# Patient Record
Sex: Male | Born: 1940
Health system: Southern US, Community
[De-identification: ages and names within clinical notes are randomized; demographics above are authoritative.]

## PROBLEM LIST (undated history)

## (undated) DIAGNOSIS — F0390 Unspecified dementia without behavioral disturbance: Secondary | ICD-10-CM

## (undated) DIAGNOSIS — M1990 Unspecified osteoarthritis, unspecified site: Secondary | ICD-10-CM

## (undated) DIAGNOSIS — D074 Carcinoma in situ of penis: Secondary | ICD-10-CM

## (undated) DIAGNOSIS — E785 Hyperlipidemia, unspecified: Secondary | ICD-10-CM

## (undated) DIAGNOSIS — N189 Chronic kidney disease, unspecified: Secondary | ICD-10-CM

## (undated) DIAGNOSIS — I10 Essential (primary) hypertension: Secondary | ICD-10-CM

## (undated) DIAGNOSIS — I6529 Occlusion and stenosis of unspecified carotid artery: Secondary | ICD-10-CM

## (undated) HISTORY — PX: KNEE SURGERY: SHX244

## (undated) HISTORY — PX: APPENDECTOMY: SHX54

## (undated) HISTORY — DX: Unspecified dementia, unspecified severity, without behavioral disturbance, psychotic disturbance, mood disturbance, and anxiety: F03.90

## (undated) HISTORY — DX: Hyperlipidemia, unspecified: E78.5

## (undated) HISTORY — PX: COLONOSCOPY: SHX174

## (undated) HISTORY — DX: Occlusion and stenosis of unspecified carotid artery: I65.29

---

## 2007-12-28 HISTORY — PX: CAROTID ENDARTERECTOMY: SUR193

## 2008-10-23 ENCOUNTER — Ambulatory Visit: Payer: Self-pay | Admitting: Vascular Surgery

## 2008-10-28 ENCOUNTER — Ambulatory Visit: Payer: Self-pay | Admitting: Vascular Surgery

## 2008-10-28 ENCOUNTER — Inpatient Hospital Stay (HOSPITAL_COMMUNITY): Admission: RE | Admit: 2008-10-28 | Discharge: 2008-10-30 | Payer: Self-pay | Admitting: Vascular Surgery

## 2008-10-28 ENCOUNTER — Encounter: Payer: Self-pay | Admitting: Vascular Surgery

## 2008-11-13 ENCOUNTER — Ambulatory Visit: Payer: Self-pay | Admitting: Vascular Surgery

## 2009-03-19 ENCOUNTER — Encounter: Admission: RE | Admit: 2009-03-19 | Discharge: 2009-03-19 | Payer: Self-pay | Admitting: Podiatry

## 2009-05-14 ENCOUNTER — Ambulatory Visit: Payer: Self-pay | Admitting: Vascular Surgery

## 2009-11-12 ENCOUNTER — Ambulatory Visit: Payer: Self-pay | Admitting: Vascular Surgery

## 2010-11-13 ENCOUNTER — Ambulatory Visit: Payer: Self-pay | Admitting: Vascular Surgery

## 2011-05-11 NOTE — Procedures (Signed)
CAROTID DUPLEX EXAM   INDICATION:  Followup evaluation of known carotid artery disease.   HISTORY:  Diabetes:  No.  Cardiac:  No.  Hypertension:  No.  Smoking:  No.  Previous Surgery:  Right carotid endarterectomy with Dacron patch  angioplasty on 10/28/2008.  CV History:  The patient reports no cerebrovascular symptoms at this  time.  Amaurosis Fugax No, Paresthesias No, Hemiparesis No                                       RIGHT             LEFT  Brachial systolic pressure:         142               142  Brachial Doppler waveforms:         Triphasic         Triphasic  Vertebral direction of flow:        Antegrade         Antegrade  DUPLEX VELOCITIES (cm/sec)  CCA peak systolic                   82                90  ECA peak systolic                   110               84  ICA peak systolic                   77                55  ICA end diastolic                   21                18  PLAQUE MORPHOLOGY:                  Soft              Mixed  PLAQUE AMOUNT:                      Mild              Mild  PLAQUE LOCATION:                    Proximal ICA      Proximal ICA   IMPRESSION:  1. No right ICA stenosis status post endarterectomy.  2. 20-39% left ICA stenosis.     ___________________________________________  Janetta Hora Fields, MD   MC/MEDQ  D:  05/14/2009  T:  05/14/2009  Job:  [id number]

## 2011-05-11 NOTE — Assessment & Plan Note (Signed)
OFFICE VISIT   Blake Atkins, Blake Atkins  DOB:  Mar 15, 1941                                       11/13/2008  CHART#:[id number]   The patient returns for followup today.  He underwent right carotid  endarterectomy on November 2.  He had a fairly high bifurcation and  still has some mild hypoglossal cranial nerve palsy.  This is improving  however.  He has had no other neurologic symptoms otherwise.  His  incision is healing well.  He has no carotid bruit.  He will return to  his normal activities as preoperatively.  He will follow up with me in  six months' time for repeat carotid duplex exam.  He will continue to  take his aspirin daily.  Hopefully within the next few weeks most of his  tongue deviation will improve and his swallowing will improve as well.   Janetta Hora. Fields, MD  Electronically Signed   CEF/MEDQ  D:  11/13/2008  T:  11/14/2008  Job:  [id number]   cc:   Kari Baars, M.D.

## 2011-05-11 NOTE — Op Note (Signed)
Blake Atkins, Blake Atkins NO.:  [id number]   MEDICAL RECORD NO.:  [id number]          PATIENT TYPE:  INP   LOCATION:  2306                         FACILITY:  [facility]   PHYSICIAN:  Janetta Hora. Fields, MD  DATE OF BIRTH:  Jul 26, 1941   DATE OF PROCEDURE:  10/28/2008  DATE OF DISCHARGE:                               OPERATIVE REPORT   PROCEDURE:  Right carotid endarterectomy.   PREOPERATIVE DIAGNOSIS:  High-grade right internal carotid artery  stenosis.   POSTOPERATIVE DIAGNOSIS:  High-grade right internal carotid artery  stenosis.   ANESTHESIA:  General.   ASSISTANT:  Wilmon Arms, PA-C   OPERATIVE FINDINGS:  1. Greater than 80% right internal carotid artery stenosis.  2. A 10-French shunt.  3. Dacron patch.  4. High carotid bifurcation.   OPERATIVE DETAILS:  After obtaining informed consent, the patient was  taken to the operating.  The patient was placed supine position on the  operating table.  After induction of general anesthesia endotracheal  intubation, the patient's entire right neck and chest were prepped and  draped usual sterile fashion.  Next, an oblique incision was made on the  right side of the neck just anterior to border of the right  sternocleidomastoid muscle.  Incision was carried down through  subcutaneous tissues down to the level of platysma.  Platysma was  incised for its full length of the incision.  Dissection was carried  down to the level of the jugular vein.  This was reflected laterally.  Common facial vein was identified and dissected free circumferentially.  This was ligated and divided between silk ties.  Next, a common carotid  artery was dissected free at the base of the incision.  Vagus nerve was  identified and protected.  Umbilical tape was placed around the common  carotid artery.  The patients carotid bifurcation was rotated over  fairly severely.  This required division of the superior thyroid artery  to allow  further rotation of the artery over to expose the internal  carotid artery, which was fairly deep and posterior.  Next, the external  carotid artery was dissected free circumferentially and a vessel loop  placed around this.  The carotid bifurcation was fairly high.  The area  of disease in the distal internal carotid artery was well above the  bifurcation and this required fairly extensive mobilization of the  distal internal carotid artery to reach the level of disease.  The  hypoglossal nerve was fully mobilized circumferentially.  A vessel loop  was placed around this.  The ansa cervicalis was divided.  The  sternocleidomastoid branches of the occipital artery were dissected free  circumferentially and ligated and divided between silk ties.  Posterior  belly of digastric muscle was divided through its tendon.  After all of  these maneuvers, I was able to expose the more distal internal carotid  artery and place a vessel loop around this.  Next, the patient was given  5000 units of intravenous heparin.  The distal internal carotid artery  was controlled with a vessel loop.  The external carotid artery was also  controlled with vessel loop.  The common carotid artery was controlled  with peripheral DeBakey clamp.  Longitudinal opening was made in the  common carotid artery and this was extended up through the level of  disease.  There was a high-grade stenosis with fairly soft and friable  plaque.  Stenosis approximately 80%.  A 10-French shunt was brought up  in the operative field and with some manipulation I was able to thread  this into the distal internal carotid artery.  Due to the very high  extent of the plaque and the dissection, it has took approximately 10  minutes to thread the shunt into the distal internal carotid artery.  This was then allowed to thoroughly back bleed.  Proximal portion of the  shunt was then threaded down into the common carotid artery and this was   secured with a Rumel tourniquet.  Next, the shunt was reopened with  restoration of flow to the brain.  An endarterectomy was then begun in a  suitable plane.  A fairly reasonable distal endpoint was obtained.  However, there was a area of intima that was fairly loose at appearance,  so this was tacked with several 7-0 Prolene sutures along with posterior  wall.  The endpoint was then secured at this point.  The remainder of  the plaque was then removed from the common carotid artery from the  external carotid artery using eversion technique.  All loose debris was  then removed from the carotid artery.  A Dacron patch was then brought  up into the operative field and sewn on his patch angioplasty using a  running 6-0 Prolene suture.  Just prior to completion of anastomosis,  the shunt was a reoccluded.  This was then first removed from the distal  internal carotid artery and allowed to back bleed thoroughly.  This was  then secured with a fine bulldog clamp.  The distal external carotid  artery was thoroughly back bled.  This was then resecured with a vessel  loop.  Common carotid artery was opened and the shunt removed from the  proximal common carotid artery and this secured with a peripheral  DeBakey clamp.  This was thoroughly flushed forward.  Everything was  then thoroughly irrigated with heparinized saline.  The remainder of the  patch was completed.  Flow was then first restored up into the external  carotid artery after approximately 5 cardiac cycles to the internal  carotid artery.  There was one area in the distal patch and 2 areas  along with side of patch that required suture repair.  After this,  everything was hemostatic.  The patient had been given an additional  5000 units of heparin during the case.  Heparin was partially reversed  at the end by administering 50 mg of protamine.  After hemostasis had  been obtained.  Doppler was used to evaluate the internal, external,  and  common carotid arteries.  These all had good Doppler flow.  There was  one additional nerve that was fairly posterior to the artery that had  also been dissected free.  This was thought to be the superior laryngeal  nerve.  Care was taken not to put too much traction on this nerve and  this was left intact.  After hemostasis had been obtained, the posterior  and anterior belly of the digastric muscle was reapproximated using a  interrupted 3-0 Prolene U stitch.  Wound was then thoroughly inspected  and found be hemostatic.  A 10-flat Jackson-Pratt drain was then placed  in the bed of carotid, brought out through separate stab incision  laterally in the neck.  This was secured in place with a 3-0 nylon  suture.  Platysma was reapproximated using a running 3-0 Vicryl suture.  Skin was closed with 4-0 Vicryl subcuticular stitch.  The patient  tolerated the procedure well and there were no complications.  Sponge  and needle counts were correct at the end of the case.  The patient was  taken to recovery room in stable condition.  He was moving upper  extremities and lower extremities symmetrically with 5/5 motor strength  at time of transfer to recovery room.      Janetta Hora. Fields, MD  Electronically Signed     CEF/MEDQ  D:  10/28/2008  T:  10/29/2008  Job:  [id number]

## 2011-05-11 NOTE — Discharge Summary (Signed)
NAMEKANIN, LIA NO.:  [id number]   MEDICAL RECORD NO.:  [id number]          PATIENT TYPE:  INP   LOCATION:  2306                         FACILITY:  [facility]   PHYSICIAN:  Janetta Hora. Fields, MD  DATE OF BIRTH:  24-Dec-1941   DATE OF ADMISSION:  10/28/2008  DATE OF DISCHARGE:  10/29/2008                               DISCHARGE SUMMARY   DISCHARGE DIAGNOSIS:  1. Right carotid occlusive disease.  2. Dyslipidemia.   PROCEDURE PERFORMED:  Right carotid endarterectomy with Dacron patch  angioplasty closure by Dr. Darrick Penna on October 28, 2008.   COMPLICATIONS:  None.   DISCHARGE MEDICATIONS:  1. Simvastatin 80 mg p.o. daily.  2. Aspirin 81 mg p.o. daily.  3. Advil 200 mg p.o. daily.  4. Restasis p.r.n.  5. Prednisolone acetate 1 drop in each eye p.r.n.  6. He is given a prescription for Percocet 5/325 one p.o. q.4 h.      p.r.n. pain total #30 were given.   CONDITION ON DISCHARGE:  Stable and improving.   DISPOSITION:  He has been discharged home in stable condition with his  wound healing well.  He is given careful instructions regarding his  activity levels and care of his wounds.  He is to return to see Dr.  Darrick Penna in 2 weeks for followup.  Brief identifying statement with  complete details, please refer the typed history and physical.  Briefly,  this very pleasant 70 year old gentleman was referred to Dr. Darrick Penna with  carotid narrowing.  Dr. Darrick Penna evaluated and found him to be a suitable  candidate for carotid endarterectomy.  He was informed of the risks and  benefits of the procedure and after careful consideration elected to  proceed with surgery.   HOSPITAL COURSE:  Preoperative workup was completed as an outpatient.  He was brought in through same-day surgery and underwent the  aforementioned right carotid endarterectomy.  For complete details,  please refer the typed operative report.  The procedure was without  complications.  He was returned to  the post anesthesia care unit and  extubated.  Following stabilization, he was admitted to a bed in a  surgical step-down unit.  The following morning, he had a residual right  tongue deviation, which was mild.  This should resolve over the next  several weeks.  He was otherwise neurologically intact.  He was desirous  of discharge and was discharged home in stable condition.      Wilmon Arms, PA      Janetta Hora. Fields, MD  Electronically Signed    KEL/MEDQ  D:  10/29/2008  T:  10/29/2008  Job:  [id number]

## 2011-05-11 NOTE — Procedures (Signed)
CAROTID DUPLEX EXAM   INDICATION:  Followup known carotid artery disease.   HISTORY:  Diabetes:  No.  Cardiac:  No.  Hypertension:  No.  Smoking:  No.  Previous Surgery:  Right carotid endarterectomy 10/28/2008.  CV History:  No.  Amaurosis Fugax No, Paresthesias No, Hemiparesis No                                       RIGHT             LEFT  Brachial systolic pressure:         122               125  Brachial Doppler waveforms:         Normal            Normal  Vertebral direction of flow:        Antegrade         Antegrade  DUPLEX VELOCITIES (cm/sec)  CCA peak systolic                   93                96  ECA peak systolic                   89                94  ICA peak systolic                   71                85  ICA end diastolic                   29                39  PLAQUE MORPHOLOGY:                                    Heterogeneous  PLAQUE AMOUNT:                      None              Mild  PLAQUE LOCATION:                                      ICA   IMPRESSION:  1. No right internal carotid artery stenosis, status post carotid      endarterectomy.  2. Left internal carotid artery velocities suggest a 1%-39% stenosis.  3. No significant change from previous study.   ___________________________________________  Blake Hora Fields, MD   EM/MEDQ  D:  11/13/2010  T:  11/13/2010  Job:  [id number]

## 2011-05-11 NOTE — Procedures (Signed)
CAROTID DUPLEX EXAM   INDICATION:  Followup known carotid artery disease.   HISTORY:  Diabetes:  No.  Cardiac:  No.  Hypertension:  No.  Smoking:  No.  Previous Surgery:  No.  CV History:  No.  Amaurosis Fugax No, Paresthesias No, Hemiparesis No                                       RIGHT             LEFT  Brachial systolic pressure:         160               140  Brachial Doppler waveforms:         Biphasic          Biphasic  Vertebral direction of flow:        Antegrade         Antegrade  DUPLEX VELOCITIES (cm/sec)  CCA peak systolic                   96                77  ECA peak systolic                   94                72  ICA peak systolic                   422               89  ICA end diastolic                   202               40  PLAQUE MORPHOLOGY:                  Heterogenous      Heterogenous  PLAQUE AMOUNT:                      Severe            Mild  PLAQUE LOCATION:                    ICA               ICA   IMPRESSION:  1. 80-99% stenosis noted in the right ICA.  2. 1-39% stenosis noted in the left ICA.  3. Antegrade bilateral vertebral arteries.   ___________________________________________  Janetta Hora Fields, MD   MG/MEDQ  D:  10/23/2008  T:  10/23/2008  Job:  [id number]

## 2011-05-11 NOTE — Assessment & Plan Note (Signed)
OFFICE VISIT   HODGES, TREIBER  DOB:  May 12, 1941                                       10/23/2008  CHART#:[id number]   The patient is a 70 year old male referred by Dr. Clelia Croft for evaluation of  asymptomatic carotid bruit.  He apparently had a duplex exam which  suggested high-grade stenosis.  The patient's atherosclerotic risk  factors include elevated cholesterol.  He denies history of diabetes,  hypertension or coronary artery disease.  He has never been a smoker.  He denies any symptoms of TIA, amaurosis or stroke.  He walks  approximately 10 miles a day.  He frequently plays golf and walks.  He  exercises on a daily basis.  He has no history of shortness of breath,  weakness or chest pain.   PAST MEDICAL HISTORY:  Otherwise unremarkable.   PAST SURGICAL HISTORY:  He had an appendectomy and a right knee  operation.   MEDICATIONS:  Include simvastatin 80 mg once a day, aspirin 81 mg once a  day, Advil 200 mg once a day, prednisolone acetate ophthalmic suspension  1% p.r.n., Restasis eye drops p.r.n.   He has no known drug allergies.   FAMILY HISTORY:  Unremarkable.   SOCIAL HISTORY:  He is married and has 2 children, is a retired of  [employer].  He has never smoked.  He drinks 1 glass of red wine  daily.   REVIEW OF SYSTEMS:  He is 5 feet 9 inches, 184 pounds.  Cardiac,  pulmonary, GI, renal, vascular, neurologic, orthopedic, psychiatric, ENT  and hematologic review of systems are all negative.   PHYSICAL EXAM:  Blood pressure is 131/85 in the left arm, 152/93 in the  right arm, pulse is 50 and regular.  HEENT is unremarkable.  He has 2+  carotid pulses with a right carotid bruit.  Chest:  Clear to  auscultation.  Cardiac exam is regular rate rhythm without murmur.  Abdomen is soft, nontender, nondistended with no masses.  Extremities:  He has 2+ radial pulses bilaterally.  He has 2+ femoral, popliteal,  dorsalis pedis and posterior tibial  pulses bilaterally.  He has no edema  in the lower extremities.  Neurologic:  Exam shows symmetric upper  extremity and lower extremity motor strength which is 5/5.  He has no  asymmetry of his face.  Cranial nerves II-XII are intact.   We repeated his carotid duplex exam today which shows a high-grade  stenosis of his right internal carotid artery.  He also has a high  bifurcation on the right side.  The diseased segment is approximately  1.5 cm above the carotid bifurcation.  Peak systolic velocity was 422  cm/sec with an end diastolic velocity of 202 cm/sec.  Of note, he also  had a 20 mm discrepancy in his blood pressure today with the right blood  pressure being 20 mm higher than the left.  He had antegrade flow in  both vertebral arteries.   In summary, the patient has a high-grade right internal carotid artery  stenosis which is currently asymptomatic.  I believe he would benefit  from right carotid endarterectomy for stroke prophylaxis.  I have told  him to continue his aspirin.  Procedure details, risks, benefits, and  possible complications of carotid endarterectomy were explained to the  patient today.  Risk of stroke 1-2%.  Risk of cranial nerve injury 5-  10%, especially in light of the high bifurcation, small risk of  bleeding, small risk of infection, small risk of myocardial infarction.  He should also have his arterial line placed on the right side since he  does have a blood pressure discrepancy and probably has some mild left  subclavian artery stenosis.  His carotid endarterectomy is scheduled for  Monday October 28, 2008.   Janetta Hora. Fields, MD  Electronically Signed   CEF/MEDQ  D:  10/24/2008  T:  10/24/2008  Job:  [id number]   cc:   Kari Baars, M.D.

## 2011-05-11 NOTE — Procedures (Signed)
CAROTID DUPLEX EXAM   INDICATION:  Follow up known carotid artery disease.   HISTORY:  Diabetes:  No.  Cardiac:  No.  Hypertension:  No.  Smoking:  No.  Previous Surgery:  Right carotid endarterectomy on 10/28/08.  CV History:  No.  Amaurosis Fugax No, Paresthesias No, Hemiparesis No.                                       RIGHT             LEFT  Brachial systolic pressure:         152               176  Brachial Doppler waveforms:         Normal            Normal  Vertebral direction of flow:        Antegrade         Antegrade  DUPLEX VELOCITIES (cm/sec)  CCA peak systolic                   95                129  ECA peak systolic                   114               101  ICA peak systolic                   68                76  ICA end diastolic                   21                23  PLAQUE MORPHOLOGY:                                    Heterogenous  PLAQUE AMOUNT:                                        Mild  PLAQUE LOCATION:                                      ICA   IMPRESSION:  1. No right internal carotid artery stenosis, status post      endarterectomy.  2. 0-39% stenosis of the left internal carotid artery.  3. Antegrade flow in bilateral vertebrals.   ___________________________________________  Janetta Hora Fields, MD   CB/MEDQ  D:  11/12/2009  T:  11/12/2009  Job:  [id number]

## 2011-09-28 LAB — COMPREHENSIVE METABOLIC PANEL WITH GFR
ALT: 25
AST: 27
Albumin: 3.9
Alkaline Phosphatase: 53
BUN: 12
CO2: 28
Calcium: 9.6
Chloride: 106
Creatinine, Ser: 1.19
GFR calc non Af Amer: >60
Glucose, Bld: 91
Potassium: 5
Sodium: 139
Total Bilirubin: 1.2
Total Protein: 6.5

## 2011-09-28 LAB — BASIC METABOLIC PANEL WITH GFR
BUN: 12
CO2: 25
Calcium: 8.4
Chloride: 106
Creatinine, Ser: 1.24
GFR calc non Af Amer: 58 — ABNORMAL LOW
Glucose, Bld: 142 — ABNORMAL HIGH
Potassium: 3.8
Sodium: 136

## 2011-09-28 LAB — CBC
Hemoglobin: 12.7 — ABNORMAL LOW
MCV: 100.5 — ABNORMAL HIGH
Platelets: 226
RDW: 12.5
RDW: 12.3
WBC: 5.6

## 2011-09-28 LAB — TYPE AND SCREEN

## 2011-09-28 LAB — APTT: aPTT: 27

## 2011-09-28 LAB — URINALYSIS, ROUTINE W REFLEX MICROSCOPIC
Bilirubin Urine: NEGATIVE
Glucose, UA: NEGATIVE

## 2011-09-28 LAB — ABO/RH: ABO/RH(D): A POS

## 2011-09-28 LAB — PROTIME-INR: Prothrombin Time: 14

## 2011-10-08 ENCOUNTER — Encounter: Payer: Self-pay | Admitting: Vascular Surgery

## 2011-10-26 ENCOUNTER — Encounter: Payer: Self-pay | Admitting: Internal Medicine

## 2011-11-19 ENCOUNTER — Ambulatory Visit: Payer: Self-pay

## 2011-11-19 ENCOUNTER — Other Ambulatory Visit: Payer: Self-pay

## 2011-11-23 ENCOUNTER — Ambulatory Visit (AMBULATORY_SURGERY_CENTER): Payer: Medicare Other

## 2011-11-23 VITALS — Ht 70.0 in | Wt 190.4 lb

## 2011-11-23 DIAGNOSIS — Z1211 Encounter for screening for malignant neoplasm of colon: Secondary | ICD-10-CM

## 2011-11-23 MED ORDER — PEG-KCL-NACL-NASULF-NA ASC-C 100 G PO SOLR: 1.0000 | Freq: Once | ORAL | Status: AC

## 2011-11-23 NOTE — Progress Notes (Signed)
Patient and wife Blake Atkins) came into office today for the pre-visit prior to the colonoscopy with Dr Marina Goodell on 12/07/11. Pt had a colonoscopy done in [place], [place] in 2001 (doesn't know doctors name), but the wife will bring a copy of the colonoscopy report to his appt on 12/07/11. Ulis Rias RN

## 2011-11-25 ENCOUNTER — Ambulatory Visit: Payer: Self-pay

## 2011-11-25 ENCOUNTER — Other Ambulatory Visit: Payer: Self-pay

## 2011-12-07 ENCOUNTER — Ambulatory Visit (AMBULATORY_SURGERY_CENTER): Payer: Medicare Other | Admitting: Internal Medicine

## 2011-12-07 ENCOUNTER — Encounter: Payer: Self-pay | Admitting: Internal Medicine

## 2011-12-07 VITALS — BP 128/90 | HR 66 | Temp 97.7°F | Resp 22 | Ht 70.0 in | Wt 190.0 lb

## 2011-12-07 DIAGNOSIS — D126 Benign neoplasm of colon, unspecified: Secondary | ICD-10-CM

## 2011-12-07 DIAGNOSIS — Z1211 Encounter for screening for malignant neoplasm of colon: Secondary | ICD-10-CM

## 2011-12-07 MED ORDER — SODIUM CHLORIDE 0.9 % IV SOLN: 500.0000 mL | INTRAVENOUS | Status: DC

## 2011-12-07 NOTE — Op Note (Signed)
[facility] [address] [place], [place]  [address]  COLONOSCOPY PROCEDURE REPORT  PATIENT:  Blake Atkins, Blake Atkins  MR#:  [id number] BIRTHDATE:  [birth year], 70 yrs. old  GENDER:  male ENDOSCOPIST:  Wilhemina Bonito. Eda Keys, MD REF. BY:  Kari Baars, M.D. PROCEDURE DATE:  12/07/2011 PROCEDURE:  Colonoscopy with snare polypectomy x 1 ASA CLASS:  Class II INDICATIONS:  colorectal cancer screening, average risk ; negative index exam in [place], [place] 12-23-00 (Dr Lucretia Roers) MEDICATIONS:   Fentanyl 75 mcg IV, Versed 7 mg IV, These medications were titrated to patient response per physician's verbal order  DESCRIPTION OF PROCEDURE:   After the risks benefits and alternatives of the procedure were thoroughly explained, informed consent was obtained.  Digital rectal exam was performed and revealed no abnormalities.   The LB CF-[id number] endoscope was introduced through the anus and advanced to the cecum, which was identified by both the appendix and ileocecal valve, without limitations.  The quality of the prep was excellent, using MoviPrep.  The instrument was then slowly withdrawn as the colon was fully examined. <<PROCEDUREIMAGES>>  FINDINGS:  Two polyps were found ascending colon (6mm) and the sigmoid colon (3mm). Polyps were snared without cautery. Retrieval was successful. Moderate diverticulosis was found in the sigmoid colon.  Otherwise normal colonoscopy without other polyps, masses, vascular ectasias, or inflammatory changes.   Retroflexed views in the rectum revealed internal hemorrhoids.    The time to cecum = 4:07  minutes. The scope was then withdrawn in  11:22  minutes from the cecum and the procedure completed.  COMPLICATIONS:  None  ENDOSCOPIC IMPRESSION: 1) Two polyps in the ascending colon and sigmoid colon - removed  2) Moderate diverticulosis in the sigmoid colon 3) Otherwise normal colonoscopy 4) Internal hemorrhoids  RECOMMENDATIONS: 1) Repeat  colonoscopy in 5 years if polyp adenomatous; otherwise 10 years  ______________________________ Wilhemina Bonito. Eda Keys, MD  CC:  Kari Baars, MD;  The Patient  n. eSIGNED:   Wilhemina Bonito. Eda Keys at 12/07/2011 01:31 PM  Barton Dubois, [phone]

## 2011-12-07 NOTE — Progress Notes (Signed)
Patient did not experience any of the following events: a burn prior to discharge; a fall within the facility; wrong site/side/patient/procedure/implant event; or a hospital transfer or hospital admission upon discharge from the facility. (G8907) Patient did not have preoperative order for IV antibiotic SSI prophylaxis. (G8918)  

## 2011-12-08 ENCOUNTER — Telehealth: Payer: Self-pay | Admitting: *Deleted

## 2011-12-08 NOTE — Telephone Encounter (Signed)

## 2011-12-10 ENCOUNTER — Other Ambulatory Visit: Payer: Self-pay | Admitting: Dermatology

## 2011-12-23 ENCOUNTER — Ambulatory Visit (INDEPENDENT_AMBULATORY_CARE_PROVIDER_SITE_OTHER): Payer: Medicare Other | Admitting: *Deleted

## 2011-12-23 ENCOUNTER — Encounter: Payer: Self-pay | Admitting: Physician Assistant

## 2011-12-23 ENCOUNTER — Ambulatory Visit (INDEPENDENT_AMBULATORY_CARE_PROVIDER_SITE_OTHER): Payer: Medicare Other | Admitting: Physician Assistant

## 2011-12-23 VITALS — BP 145/80 | HR 95 | Resp 20 | Ht 70.0 in | Wt 185.0 lb

## 2011-12-23 DIAGNOSIS — Z48812 Encounter for surgical aftercare following surgery on the circulatory system: Secondary | ICD-10-CM

## 2011-12-23 DIAGNOSIS — I6529 Occlusion and stenosis of unspecified carotid artery: Secondary | ICD-10-CM

## 2011-12-23 NOTE — Progress Notes (Signed)
[facility] [place] HISTORY AND PHYSICAL   CC:  Follow up carotid duplex scan  Referring Provider:  Kari Baars, MD  HPI: This is a 70 y.o. male here for f/u carotid duplex scan. He denies amaurosis fugax, paresthesias, or hemiparesis.  He plays golf daily and continues to exercise and work out daily.  Past Medical History  Diagnosis Date  . Hyperlipidemia   . Carotid artery occlusion    Past Surgical History  Procedure Date  . Knee surgery     right  . Carotid endarterectomy   . Appendectomy   . Colonoscopy     No Known Allergies  Current Outpatient Prescriptions  Medication Sig Dispense Refill  . aspirin EC 81 MG tablet Take 81 mg by mouth daily.        . clotrimazole-betamethasone (LOTRISONE) cream       . CRESTOR 40 MG tablet       . cycloSPORINE (RESTASIS) 0.05 % ophthalmic emulsion 1 drop as needed.        Marland Kitchen ibuprofen (ADVIL,MOTRIN) 200 MG tablet Take 200 mg by mouth every 6 (six) hours as needed.        . prednisoLONE acetate (PRED FORTE) 1 % ophthalmic suspension 1 drop as needed.        . simvastatin (ZOCOR) 40 MG tablet Take 40 mg by mouth at bedtime. Take 1/2 at bedtime        Family History  Problem Relation Age of Onset  . Diabetes Maternal Grandmother     History   Social History  . Marital Status: Married    Spouse Name: N/A    Number of Children: N/A  . Years of Education: N/A   Occupational History  . Not on file.   Social History Main Topics  . Smoking status: Never Smoker   . Smokeless tobacco: Never Used  . Alcohol Use: 4.2 oz/week    7 Glasses of wine per week  . Drug Use: No  . Sexually Active: Not on file   Other Topics Concern  . Not on file   Social History Narrative  . No narrative on file     ROS: [x]  Positive   [ ]  Negative   [ ]  All sytems reviewed and are negative  Cardiovascular: [] chest pain; [] chest pressure; [] palpitations; [] SOB lying flat; [] DOE; [] pain in legs with walking; [] pain in  legs when lying flat; [] Hx of DVT; [] Hx phlebitis; [] swelling in legs; [] varicose veins Pulmonary: [] productive cough; [] asthma; [] wheezing Neurologic: [] Hx CVA; [] weakness in arms or legs; [] numbness in arms or legs; [] difficulty in speaking or slurred speech; [] temporary loss of vision in one eye; [] dizziness Hematologic:  [] bleeding problems; [] clots easily GI:  [] vomiting blood; []  blood in stool; [] PUD GU: []  Dysuria; [] hematuria Psychiatric:  [] Hx major depression Integumentary:  [] rashes; [] ulcers Constitutional:  [] fever; [] chills   PHYSICAL EXAMINATION:  Filed Vitals:   12/23/11 1516  BP: 145/80  Pulse: 95  Resp: 20   Body mass index is 26.54 kg/(m^2).  General:  WDWN in NAD Gait: Normal HENT: WNL Eyes: PERRL Pulmonary: normal non-labored breathing , without Rales, rhonchi,  wheezing Cardiac: RRR, without  Murmurs, rubs or gallops; No carotid bruits Abdomen: soft, NT, no masses Skin: no rashes, ulcers noted Vascular Exam/Pulses: BLE warm and well perfused.  No edema.  2+ bilateral radial pulses. Extremities: without ischemic changes, no Gangrene , no cellulitis; no open wounds;  Musculoskeletal: no muscle wasting or atrophy  Neurologic: A&O X 3;  Appropriate Affect ; SENSATION: normal; MOTOR FUNCTION:  moving all extremities equally. Speech is fluent/normal  Non-Invasive Vascular Imaging: Carotid Duplex Scan: 12/23/11    Right  left  Brachial sys pressure iso 148  Brachial doppler waveforms WNL WNL  Vertebral direction of flow ante ante  DUPLEX VELOCITITES    CCA peak systolic 88 82  ECA peak systolic 134 81  ICA peak systolic 63 84  ICA end diastolic 13 24  Plaque morph    Plaque amt NA minimal      1. Widely patent R CIA without evidence of restenosis 2.  1-39% L ICA plaquing 3.  Bilateral VA WNL ASSESSMENT: 70 y.o. male here for f/u carotid duplex scan. Continues to remain asymptomatic.  PLAN: f/u carotid duplex in 1 year. He knows to contact us  sooner if he has any problems.  Newton Pigg, PA-C [facility] [phone]  Clinic MD:   Darrick Penna

## 2012-01-07 NOTE — Procedures (Unsigned)
CAROTID DUPLEX EXAM  INDICATION:  Follow up right CEA.  HISTORY: Diabetes:  No. Cardiac:  No. Hypertension:  No. Smoking:  No. Previous Surgery:  Right CEA, 10/28/08. CV History: Amaurosis Fugax No, Paresthesias No, Hemiparesis No.                                      RIGHT             LEFT Brachial systolic pressure:         150               148 Brachial Doppler waveforms:         WNL               WNL Vertebral direction of flow:        Antegrade         Antegrade DUPLEX VELOCITIES (cm/sec) CCA peak systolic                   88                82 ECA peak systolic                   134               81 ICA peak systolic                   63                87 ICA end diastolic                   13                24 PLAQUE MORPHOLOGY: PLAQUE AMOUNT:                      N/A               Minimal PLAQUE LOCATION:  IMPRESSION: 1. Widely patent right carotid endarterectomy without evidence of     restenosis or hyperplasia. 2. Minimal plaquing of the left internal carotid artery, resulting in     1% to 39% stenosis. 3. Bilateral vertebral arteries within normal limits.  ___________________________________________ Janetta Hora Fields, MD  LT/MEDQ  D:  12/23/2011  T:  12/23/2011  Job:  [id number]

## 2012-12-19 ENCOUNTER — Other Ambulatory Visit: Payer: Self-pay | Admitting: *Deleted

## 2012-12-19 DIAGNOSIS — I6529 Occlusion and stenosis of unspecified carotid artery: Secondary | ICD-10-CM

## 2012-12-19 DIAGNOSIS — Z48812 Encounter for surgical aftercare following surgery on the circulatory system: Secondary | ICD-10-CM

## 2012-12-26 ENCOUNTER — Encounter: Payer: Self-pay | Admitting: Neurosurgery

## 2012-12-27 HISTORY — PX: EYE SURGERY: SHX253

## 2012-12-28 ENCOUNTER — Encounter: Payer: Self-pay | Admitting: Neurosurgery

## 2012-12-28 ENCOUNTER — Ambulatory Visit (INDEPENDENT_AMBULATORY_CARE_PROVIDER_SITE_OTHER): Payer: Medicare Other | Admitting: Neurosurgery

## 2012-12-28 ENCOUNTER — Other Ambulatory Visit (INDEPENDENT_AMBULATORY_CARE_PROVIDER_SITE_OTHER): Payer: Medicare Other | Admitting: *Deleted

## 2012-12-28 VITALS — BP 136/86 | HR 53 | Resp 20 | Ht 70.0 in | Wt 171.0 lb

## 2012-12-28 DIAGNOSIS — I6529 Occlusion and stenosis of unspecified carotid artery: Secondary | ICD-10-CM

## 2012-12-28 DIAGNOSIS — Z48812 Encounter for surgical aftercare following surgery on the circulatory system: Secondary | ICD-10-CM

## 2012-12-28 NOTE — Progress Notes (Signed)
[facility] [place] Carotid Office Note  CC: Carotid surveillance Referring Physician: Fields  History of Present Illness: 72 year old male patient of Dr. Darrick Penna will status post right CEA in 2009. The patient denies signs or symptoms of CVA, TIA, amaurosis fugax or any neural deficit. The patient denies any new medical diagnoses or recent surgery and reports his serum cholesterol is less than 100 and is currently taking Crestor and his primary care physician is very happy with this.  Past Medical History  Diagnosis Date  . Hyperlipidemia   . Carotid artery occlusion     ROS: [x]  Positive   [ ]  Denies    General: [ ]  Weight loss, [ ]  Fever, [ ]  chills Neurologic: [ ]  Dizziness, [ ]  Blackouts, [ ]  Seizure [ ]  Stroke, [ ]  "Mini stroke", [ ]  Slurred speech, [ ]  Temporary blindness; [ ]  weakness in arms or legs, [ ]  Hoarseness Cardiac: [ ]  Chest pain/pressure, [ ]  Shortness of breath at rest [ ]  Shortness of breath with exertion, [ ]  Atrial fibrillation or irregular heartbeat Vascular: [ ]  Pain in legs with walking, [ ]  Pain in legs at rest, [ ]  Pain in legs at night,  [ ]  Non-healing ulcer, [ ]  Blood clot in vein/DVT,   Pulmonary: [ ]  Home oxygen, [ ]  Productive cough, [ ]  Coughing up blood, [ ]  Asthma,  [ ]  Wheezing Musculoskeletal:  [ ]  Arthritis, [ ]  Low back pain, [ ]  Joint pain Hematologic: [ ]  Easy Bruising, [ ]  Anemia; [ ]  Hepatitis Gastrointestinal: [ ]  Blood in stool, [ ]  Gastroesophageal Reflux/heartburn, [ ]  Trouble swallowing Urinary: [ ]  chronic Kidney disease, [ ]  on HD - [ ]  MWF or [ ]  TTHS, [ ]  Burning with urination, [ ]  Difficulty urinating Skin: [ ]  Rashes, [ ]  Wounds Psychological: [ ]  Anxiety, [ ]  Depression   Social History History  Substance Use Topics  . Smoking status: Never Smoker   . Smokeless tobacco: Never Used  . Alcohol Use: 4.2 oz/week    7 Glasses of wine per week    Family History Family History  Problem Relation Age of  Onset  . Diabetes Maternal Grandmother     No Known Allergies  Current Outpatient Prescriptions  Medication Sig Dispense Refill  . aspirin EC 81 MG tablet Take 81 mg by mouth daily.        . clotrimazole-betamethasone (LOTRISONE) cream       . CRESTOR 40 MG tablet       . cycloSPORINE (RESTASIS) 0.05 % ophthalmic emulsion 1 drop as needed.        Marland Kitchen ibuprofen (ADVIL,MOTRIN) 200 MG tablet Take 200 mg by mouth every 6 (six) hours as needed.        . Omega-3 Fatty Acids (FISH OIL) 1000 MG CAPS Take 1,000 mg by mouth daily.      . prednisoLONE acetate (PRED FORTE) 1 % ophthalmic suspension 1 drop as needed.        . simvastatin (ZOCOR) 40 MG tablet Take 40 mg by mouth at bedtime. Take 1/2 at bedtime        Physical Examination  Filed Vitals:   12/28/12 1344  BP: 136/86  Pulse: 53  Resp:     Body mass index is 24.54 kg/(m^2).  General:  WDWN in NAD Gait: Normal HEENT: WNL Eyes: Pupils equal Pulmonary: normal non-labored breathing , without Rales, rhonchi,  wheezing Cardiac: RRR, without  Murmurs, rubs or gallops; Abdomen:  soft, NT, no masses Skin: no rashes, ulcers noted  Vascular Exam Pulses: 3+ radial pulses bilaterally Carotid bruits: Carotid pulses to auscultation no bruits are heard Extremities without ischemic changes, no Gangrene , no cellulitis; no open wounds;  Musculoskeletal: no muscle wasting or atrophy   Neurologic: A&O X 3; Appropriate Affect ; SENSATION: normal; MOTOR FUNCTION:  moving all extremities equally. Speech is fluent/normal  Non-Invasive Vascular Imaging CAROTID DUPLEX 12/28/2012  Right ICA 0 - 19% stenosis Left ICA 20 - 39 % stenosis   ASSESSMENT/PLAN: Asymptomatic patient with unchanged carotid duplex from one year ago. The patient will followup in one year with repeat carotid duplex. The patient's questions were encouraged and answered, he is in agreement with this plan.  Lauree Chandler ANP   Clinic MD: Darrick Penna

## 2013-01-16 ENCOUNTER — Other Ambulatory Visit: Payer: Self-pay | Admitting: *Deleted

## 2013-01-16 DIAGNOSIS — I6529 Occlusion and stenosis of unspecified carotid artery: Secondary | ICD-10-CM

## 2013-01-27 HISTORY — PX: EYE SURGERY: SHX253

## 2013-09-27 ENCOUNTER — Other Ambulatory Visit: Payer: Self-pay | Admitting: Vascular Surgery

## 2013-09-27 DIAGNOSIS — I6529 Occlusion and stenosis of unspecified carotid artery: Secondary | ICD-10-CM

## 2013-12-28 ENCOUNTER — Other Ambulatory Visit (HOSPITAL_COMMUNITY): Payer: Medicare Other

## 2013-12-28 ENCOUNTER — Ambulatory Visit: Payer: Medicare Other | Admitting: Family

## 2013-12-28 ENCOUNTER — Inpatient Hospital Stay (HOSPITAL_COMMUNITY): Admission: RE | Admit: 2013-12-28 | Payer: Medicare Other | Source: Ambulatory Visit

## 2014-01-17 ENCOUNTER — Encounter: Payer: Self-pay | Admitting: Family

## 2014-01-18 ENCOUNTER — Ambulatory Visit (HOSPITAL_COMMUNITY)
Admission: RE | Admit: 2014-01-18 | Discharge: 2014-01-18 | Disposition: A | Discharge status: Home / Self Care | Payer: Medicare Other | Source: Ambulatory Visit | Attending: Family | Admitting: Family

## 2014-01-18 ENCOUNTER — Ambulatory Visit (INDEPENDENT_AMBULATORY_CARE_PROVIDER_SITE_OTHER): Payer: Medicare Other | Admitting: Family

## 2014-01-18 ENCOUNTER — Encounter: Payer: Self-pay | Admitting: Family

## 2014-01-18 VITALS — BP 146/90 | HR 61 | Resp 14 | Ht 70.0 in | Wt 175.0 lb

## 2014-01-18 DIAGNOSIS — I6529 Occlusion and stenosis of unspecified carotid artery: Secondary | ICD-10-CM

## 2014-01-18 DIAGNOSIS — Z4931 Encounter for adequacy testing for hemodialysis: Secondary | ICD-10-CM

## 2014-01-18 DIAGNOSIS — Z48812 Encounter for surgical aftercare following surgery on the circulatory system: Secondary | ICD-10-CM | POA: Insufficient documentation

## 2014-01-18 NOTE — Patient Instructions (Signed)

## 2014-01-18 NOTE — Progress Notes (Signed)
Established Carotid Patient   History of Present Illness  Blake Atkins is a 73 y.o. male patient of Dr. Oneida Alar will status post right CEA in 2009. He returns today for follow up.  Patient has Negative history of TIA or stroke symptom.  The patient denies amaurosis fugax or monocular blindness.  The patient  denies facial drooping.  Pt. denies hemiplegia.  The patient denies receptive or expressive aphasia.  Pt. denies extremity weakness. He plays golf or exercises daily. He denies any cardiac problems, denies claudication symptoms, denies non-healing wounds   Pt reports New Medical or Surgical History: had both cataracts extracted with IOL's implanted.  Pt Diabetic: No Pt smoker: non-smoker  Pt meds include: Statin : Yes ASA: Yes Other anticoagulants/antiplatelets: no   Past Medical History  Diagnosis Date  . Hyperlipidemia   . Carotid artery occlusion     Social History History  Substance Use Topics  . Smoking status: Never Smoker   . Smokeless tobacco: Never Used  . Alcohol Use: 4.2 oz/week    7 Glasses of wine per week    Family History Family History  Problem Relation Age of Onset  . Diabetes Maternal Grandmother     Surgical History Past Surgical History  Procedure Laterality Date  . Knee surgery      right  . Carotid endarterectomy    . Appendectomy    . Colonoscopy      No Known Allergies  Current Outpatient Prescriptions  Medication Sig Dispense Refill  . aspirin EC 81 MG tablet Take 81 mg by mouth daily.        . clotrimazole-betamethasone (LOTRISONE) cream       . CRESTOR 40 MG tablet       . cycloSPORINE (RESTASIS) 0.05 % ophthalmic emulsion 1 drop as needed.        Marland Kitchen ibuprofen (ADVIL,MOTRIN) 200 MG tablet Take 200 mg by mouth every 6 (six) hours as needed.        . Omega-3 Fatty Acids (FISH OIL) 1000 MG CAPS Take 1,000 mg by mouth daily.      . prednisoLONE acetate (PRED FORTE) 1 % ophthalmic suspension 1 drop as needed.        .  simvastatin (ZOCOR) 40 MG tablet Take 40 mg by mouth at bedtime. Take 1/2 at bedtime       No current facility-administered medications for this visit.    Review of Systems : See HPI for pertinent positives and negatives.  Physical Examination  Filed Vitals:   01/18/14 1357  BP: 146/90  Pulse: 61  Resp: 14   Filed Weights   01/18/14 1357  Weight: 175 lb (79.379 kg)   Body mass index is 25.11 kg/(m^2).  General: WDWN male in NAD. GAIT: normal Eyes: PERRLA Pulmonary:  CTAB, Negative  Rales, Negative rhonchi, & Negative wheezing.  Cardiac: regular Rhythm ,  No detected Murmur.  VASCULAR EXAM Carotid Bruits Left Right   Negative Negative    Aorta is not palpable. Radial pulses are 3+ palpable and equal.  LE Pulses LEFT RIGHT       POPLITEAL  not palpable   not palpable       POSTERIOR TIBIAL   palpable    palpable        DORSALIS PEDIS      ANTERIOR TIBIAL  palpable   palpable     Gastrointestinal: soft, nontender, BS WNL, no r/g,  negative masses.  Musculoskeletal: Negative muscle atrophy/wasting. M/S 5/5 throughout, Extremities without ischemic changes.  Neurologic: A&O X 3; Appropriate Affect ; SENSATION ;normal;  Speech is normal CN 2-12 intact except is slightly hard of hearing, Pain and light touch intact in extremities, Motor exam as listed above.   Non-Invasive Vascular Imaging CAROTID DUPLEX 01/18/2014   Right ICA: widely patent CEA site. Left ICA: <40% stenosis. Bilateral vertebral artery is antegrade.  These findings are Unchanged from previous exam.  Assessment: Blake Atkins is a 73 y.o. male who presents with asymptomatic widely patent right ICA which is the CEA site, and minimal plaque in left ICA. The  ICA stenosis is  Unchanged from previous exam. Pt requested that he return in 2 years since he is doing so  well.  Plan: Follow-up in 2 years with Carotid Duplex scan.  I discussed in depth with the patient the nature of atherosclerosis, and emphasized the importance of maximal medical management including strict control of blood pressure, blood glucose, and lipid levels, obtaining regular exercise, and continued cessation of smoking.  The patient is aware that without maximal medical management the underlying atherosclerotic disease process will progress, limiting the benefit of any interventions.  The patient was given information about stroke prevention and what symptoms should prompt the patient to seek immediate medical care. Thank you for allowing [place] to participate in this patient's care.  Clemon Chambers, RN, MSN, FNP-C [facility] [place] Office: [phone]  Clinic Physician: Bridgett Larsson  01/18/2014 1:26 PM

## 2014-01-21 NOTE — Addendum Note (Signed)
Addended by: Dorthula Rue L on: 01/21/2014 11:23 AM   Modules accepted: Orders

## 2016-01-14 ENCOUNTER — Encounter: Payer: Self-pay | Admitting: Family

## 2016-01-22 ENCOUNTER — Ambulatory Visit (INDEPENDENT_AMBULATORY_CARE_PROVIDER_SITE_OTHER): Payer: 59 | Admitting: Family

## 2016-01-22 ENCOUNTER — Ambulatory Visit (HOSPITAL_COMMUNITY)
Admission: RE | Admit: 2016-01-22 | Discharge: 2016-01-22 | Disposition: A | Discharge status: Home / Self Care | Payer: Medicare Other | Source: Ambulatory Visit | Attending: Family | Admitting: Family

## 2016-01-22 ENCOUNTER — Encounter: Payer: Self-pay | Admitting: Family

## 2016-01-22 VITALS — BP 154/92 | HR 61 | Temp 97.6°F | Resp 16 | Ht 70.0 in | Wt 178.0 lb

## 2016-01-22 DIAGNOSIS — Z48812 Encounter for surgical aftercare following surgery on the circulatory system: Secondary | ICD-10-CM

## 2016-01-22 DIAGNOSIS — I6522 Occlusion and stenosis of left carotid artery: Secondary | ICD-10-CM | POA: Diagnosis not present

## 2016-01-22 DIAGNOSIS — I6523 Occlusion and stenosis of bilateral carotid arteries: Secondary | ICD-10-CM | POA: Diagnosis not present

## 2016-01-22 DIAGNOSIS — Z9889 Other specified postprocedural states: Secondary | ICD-10-CM

## 2016-01-22 DIAGNOSIS — E785 Hyperlipidemia, unspecified: Secondary | ICD-10-CM | POA: Diagnosis not present

## 2016-01-22 NOTE — Patient Instructions (Signed)
Stroke Prevention Some medical conditions and behaviors are associated with an increased chance of having a stroke. You may prevent a stroke by making healthy choices and managing medical conditions. HOW CAN I REDUCE MY RISK OF HAVING A STROKE?   Stay physically active. Get at least 30 minutes of activity on most or all days.  Do not smoke. It may also be helpful to avoid exposure to secondhand smoke.  Limit alcohol use. Moderate alcohol use is considered to be:  No more than 2 drinks per day for men.  No more than 1 drink per day for nonpregnant women.  Eat healthy foods. This involves:  Eating 5 or more servings of fruits and vegetables a day.  Making dietary changes that address high blood pressure (hypertension), high cholesterol, diabetes, or obesity.  Manage your cholesterol levels.  Making food choices that are high in fiber and low in saturated fat, trans fat, and cholesterol may control cholesterol levels.  Take any prescribed medicines to control cholesterol as directed by your health care provider.  Manage your diabetes.  Controlling your carbohydrate and sugar intake is recommended to manage diabetes.  Take any prescribed medicines to control diabetes as directed by your health care provider.  Control your hypertension.  Making food choices that are low in salt (sodium), saturated fat, trans fat, and cholesterol is recommended to manage hypertension.  Ask your health care provider if you need treatment to lower your blood pressure. Take any prescribed medicines to control hypertension as directed by your health care provider.  If you are 18-39 years of age, have your blood pressure checked every 3-5 years. If you are 40 years of age or older, have your blood pressure checked every year.  Maintain a healthy weight.  Reducing calorie intake and making food choices that are low in sodium, saturated fat, trans fat, and cholesterol are recommended to manage  weight.  Stop drug abuse.  Avoid taking birth control pills.  Talk to your health care provider about the risks of taking birth control pills if you are over 35 years old, smoke, get migraines, or have ever had a blood clot.  Get evaluated for sleep disorders (sleep apnea).  Talk to your health care provider about getting a sleep evaluation if you snore a lot or have excessive sleepiness.  Take medicines only as directed by your health care provider.  For some people, aspirin or blood thinners (anticoagulants) are helpful in reducing the risk of forming abnormal blood clots that can lead to stroke. If you have the irregular heart rhythm of atrial fibrillation, you should be on a blood thinner unless there is a good reason you cannot take them.  Understand all your medicine instructions.  Make sure that other conditions (such as anemia or atherosclerosis) are addressed. SEEK IMMEDIATE MEDICAL CARE IF:   You have sudden weakness or numbness of the face, arm, or leg, especially on one side of the body.  Your face or eyelid droops to one side.  You have sudden confusion.  You have trouble speaking (aphasia) or understanding.  You have sudden trouble seeing in one or both eyes.  You have sudden trouble walking.  You have dizziness.  You have a loss of balance or coordination.  You have a sudden, severe headache with no known cause.  You have new chest pain or an irregular heartbeat. Any of these symptoms may represent a serious problem that is an emergency. Do not wait to see if the symptoms will   go away. Get medical help at once. Call your local emergency services (911 in [place] Do not drive yourself to the hospital.   This information is not intended to replace advice given to you by your health care provider. Make sure you discuss any questions you have with your health care provider.   Document Released: 01/20/2005 Document Revised: 01/03/2015 Document Reviewed:  06/15/2013 Elsevier Interactive Patient Education 2016 [employer] Inc[employer]  

## 2016-01-22 NOTE — Progress Notes (Signed)
Chief Complaint: Extracranial Carotid Artery Stenosis   History of Present Illness  Blake Atkins is a 75 y.o. male patient of Dr. Oneida Alar who is status post right CEA in 2009. He returns today for 2 year follow up.  He denies any history of TIA or stroke symptoms.Specifically the patient denies a history of amaurosis fugax or monocular blindness, unilateral facial drooping, hemiparesis, or receptive or expressive aphasia. He plays golf or exercises daily. He denies any cardiac problems, denies claudication symptoms, denies non-healing wounds. He reports a history of dry eyes and states his eyes stay red looking.  Pt Diabetic: No Pt smoker: non-smoker  Pt meds include: Statin : Yes ASA: Yes Other anticoagulants/antiplatelets: no   Past Medical History  Diagnosis Date  . Hyperlipidemia   . Carotid artery occlusion     Social History Social History  Substance Use Topics  . Smoking status: Never Smoker   . Smokeless tobacco: Never Used  . Alcohol Use: 4.2 oz/week    7 Glasses of wine per week    Family History Family History  Problem Relation Age of Onset  . Diabetes Maternal Grandmother   . Cancer Mother     Lung  . Cancer Father     Abdominal     Surgical History Past Surgical History  Procedure Laterality Date  . Knee surgery      right  . Appendectomy    . Colonoscopy    . Eye surgery Left Jan. 2014    Cataract and Lens implant  . Eye surgery Right Feb. 2014    Cataract and Lens implant  . Carotid endarterectomy Right 2009    CEA    No Known Allergies  Current Outpatient Prescriptions  Medication Sig Dispense Refill  . aspirin EC 81 MG tablet Take 81 mg by mouth daily.      . Omega-3 Fatty Acids (FISH OIL) 1000 MG CAPS Take 1,000 mg by mouth daily.    Marland Kitchen Propylene Glycol (SYSTANE BALANCE OP) Apply to eye as needed.    . rosuvastatin (CRESTOR) 20 MG tablet daily.  3  . clotrimazole-betamethasone (LOTRISONE) cream Reported on 01/22/2016    .  CRESTOR 40 MG tablet 20 mg daily. Reported on 01/22/2016    . cycloSPORINE (RESTASIS) 0.05 % ophthalmic emulsion 1 drop as needed. Reported on 01/22/2016    . ibuprofen (ADVIL,MOTRIN) 200 MG tablet Take 200 mg by mouth every 6 (six) hours as needed. Reported on 01/22/2016    . prednisoLONE acetate (PRED FORTE) 1 % ophthalmic suspension 1 drop as needed. Reported on 01/22/2016    . simvastatin (ZOCOR) 40 MG tablet Take 40 mg by mouth at bedtime. Reported on 01/22/2016     No current facility-administered medications for this visit.    Review of Systems : See HPI for pertinent positives and negatives.  Physical Examination  Filed Vitals:   01/22/16 1524 01/22/16 1527 01/22/16 1536  BP: 142/81 140/83 154/92  Pulse: 55 56 61  Temp:  97.6 F (36.4 C)   TempSrc:  Oral   Resp:  16   Height:  5\' 10"  (1.778 m)   Weight:  178 lb (80.74 kg)   SpO2:  99%    Body mass index is 25.54 kg/(m^2).  General: WDWN male in NAD, fit appearing and younger appearing than stated age. GAIT: normal Eyes: PERRLA. Sclerae are slightly red. Pulmonary: CTAB, normal, non labored  Cardiac: regular rhythm, no detected murmur.  VASCULAR EXAM Carotid Bruits Left Right  Negative Negative   Aorta is not palpable. Radial pulses are 3+ palpable and equal.      LE Pulses LEFT RIGHT   POPLITEAL not palpable  not palpable   POSTERIOR TIBIAL  palpable   palpable    DORSALIS PEDIS  ANTERIOR TIBIAL palpable  palpable     Gastrointestinal: soft, nontender, BS WNL, no r/g, no palpable masses.  Musculoskeletal: No muscle atrophy/wasting. M/S 5/5 throughout, Extremities without ischemic changes.  Neurologic: A&O X 3; Appropriate Affect, normal sensation, Speech is normal CN 2-12 intact except is slightly hard of hearing, Pain and light  touch intact in extremities, Motor exam as listed above.          Non-Invasive Vascular Imaging CAROTID DUPLEX 01/22/2016   Right ICA: widely patent CEA site with no restenosis. Left ICA: <40% stenosis. No significant change compared to prior exam.   Assessment: Blake Atkins is a 75 y.o. male who is status post right CEA in 2009. He has no history of stroke or TIA. Today's carotid duplex suggests a widely patent right ICA (CEA site) with no restenosis. Left ICA: <40% stenosis. No significant change compared to prior exam.  Fortunately he has never used tobacco and does not have DM. He exercises daily and is fit appearing and younger in appearance than his stated age.    Plan: Follow-up in 2 years with Carotid Duplex scan.   I discussed in depth with the patient the nature of atherosclerosis, and emphasized the importance of maximal medical management including strict control of blood pressure, blood glucose, and lipid levels, obtaining regular exercise, and continued cessation of smoking.  The patient is aware that without maximal medical management the underlying atherosclerotic disease process will progress, limiting the benefit of any interventions. The patient was given information about stroke prevention and what symptoms should prompt the patient to seek immediate medical care. Thank you for allowing [place] to participate in this patient's care.  Clemon Chambers, RN, MSN, FNP-C [facility] [place] Office: [phone]  Clinic Physician: Oneida Alar  01/22/2016 3:44 PM

## 2016-01-22 NOTE — Progress Notes (Signed)
Filed Vitals:   01/22/16 1524 01/22/16 1527 01/22/16 1536  BP: 142/81 140/83 154/92  Pulse: 55 56 61  Temp:  97.6 F (36.4 C)   TempSrc:  Oral   Resp:  16   Height:  5\' 10"  (1.778 m)   Weight:  178 lb (80.74 kg)   SpO2:  99%

## 2016-01-23 NOTE — Addendum Note (Signed)
Addended by: Dorthula Rue L on: 01/23/2016 09:56 AM   Modules accepted: Orders

## 2016-10-15 ENCOUNTER — Encounter: Payer: Self-pay | Admitting: Internal Medicine

## 2016-11-15 ENCOUNTER — Encounter: Payer: Self-pay | Admitting: Internal Medicine

## 2016-11-25 ENCOUNTER — Ambulatory Visit (AMBULATORY_SURGERY_CENTER): Payer: Self-pay

## 2016-11-25 VITALS — Ht 69.0 in | Wt 176.2 lb

## 2016-11-25 DIAGNOSIS — Z860100 Personal history of colon polyps, unspecified: Secondary | ICD-10-CM

## 2016-11-25 MED ORDER — SUPREP BOWEL PREP KIT 17.5-3.13-1.6 GM/177ML PO SOLN: 1.0000 | Freq: Once | ORAL | 0 refills | Status: AC

## 2016-11-25 NOTE — Progress Notes (Signed)
No allergies to eggs or soy No past problems with anesthesia No diet meds No home oxygen  Declined emmi 

## 2016-11-29 ENCOUNTER — Encounter: Payer: Self-pay | Admitting: Internal Medicine

## 2016-12-09 ENCOUNTER — Ambulatory Visit (AMBULATORY_SURGERY_CENTER): Payer: Medicare Other | Admitting: Internal Medicine

## 2016-12-09 ENCOUNTER — Encounter: Payer: Self-pay | Admitting: Internal Medicine

## 2016-12-09 VITALS — BP 113/61 | HR 47 | Temp 97.7°F | Resp 12 | Ht 69.0 in | Wt 176.0 lb

## 2016-12-09 DIAGNOSIS — D123 Benign neoplasm of transverse colon: Secondary | ICD-10-CM | POA: Diagnosis not present

## 2016-12-09 DIAGNOSIS — Z86010 Personal history of colonic polyps: Secondary | ICD-10-CM

## 2016-12-09 MED ORDER — SODIUM CHLORIDE 0.9 % IV SOLN: 500.0000 mL | INTRAVENOUS | Status: DC

## 2016-12-09 NOTE — Progress Notes (Signed)
Called to room to assist during endoscopic procedure.  Patient ID and intended procedure confirmed with present staff. Received instructions for my participation in the procedure from the performing physician.  

## 2016-12-09 NOTE — Patient Instructions (Signed)
Impression/Recommendations:  Polyp handout given to patient. Diverticulosis handout given to patient.  YOU HAD AN ENDOSCOPIC PROCEDURE TODAY AT [facility]:   Refer to the procedure report that was given to you for any specific questions about what was found during the examination.  If the procedure report does not answer your questions, please call your gastroenterologist to clarify.  If you requested that your care partner not be given the details of your procedure findings, then the procedure report has been included in a sealed envelope for you to review at your convenience later.  YOU SHOULD EXPECT: Some feelings of bloating in the abdomen. Passage of more gas than usual.  Walking can help get rid of the air that was put into your GI tract during the procedure and reduce the bloating. If you had a lower endoscopy (such as a colonoscopy or flexible sigmoidoscopy) you may notice spotting of blood in your stool or on the toilet paper. If you underwent a bowel prep for your procedure, you may not have a normal bowel movement for a few days.  Please Note:  You might notice some irritation and congestion in your nose or some drainage.  This is from the oxygen used during your procedure.  There is no need for concern and it should clear up in a day or so.  SYMPTOMS TO REPORT IMMEDIATELY:   Following lower endoscopy (colonoscopy or flexible sigmoidoscopy):  Excessive amounts of blood in the stool  Significant tenderness or worsening of abdominal pains  Swelling of the abdomen that is new, acute  Fever of 100F or higher For urgent or emergent issues, a gastroenterologist can be reached at any hour by calling [phone].   DIET:  We do recommend a small meal at first, but then you may proceed to your regular diet.  Drink plenty of fluids but you should avoid alcoholic beverages for 24 hours.  ACTIVITY:  You should plan to take it easy for the rest of today and you should NOT  DRIVE or use heavy machinery until tomorrow (because of the sedation medicines used during the test).    FOLLOW UP: Our staff will call the number listed on your records the next business day following your procedure to check on you and address any questions or concerns that you may have regarding the information given to you following your procedure. If we do not reach you, we will leave a message.  However, if you are feeling well and you are not experiencing any problems, there is no need to return our call.  We will assume that you have returned to your regular daily activities without incident.  If any biopsies were taken you will be contacted by phone or by letter within the next 1-3 weeks.  Please call us at [phone] if you have not heard about the biopsies in 3 weeks.    SIGNATURES/CONFIDENTIALITY: You and/or your care partner have signed paperwork which will be entered into your electronic medical record.  These signatures attest to the fact that that the information above on your After Visit Summary has been reviewed and is understood.  Full responsibility of the confidentiality of this discharge information lies with you and/or your care-partner.

## 2016-12-09 NOTE — Op Note (Signed)
[place] Patient Name: Blake Atkins Procedure Date: 12/09/2016 11:19 AM MRN: [id number] Endoscopist: Docia Chuck. Henrene Pastor , MD Age: 75 Referring MD:  Date of Birth: May 18, 1941 Gender: Male Account #: [id number] Procedure:                Colonoscopy, with cold snare polypectomy x 1 Indications:              Surveillance: Personal history of colonic polyps                            (unknown histology) on last colonoscopy 5 years                            ago, High risk colon cancer surveillance: Personal                            history of non-advanced adenoma. In addition, index                            examination 2001 elsewherewas negative Medicines:                Monitored Anesthesia Care Procedure:                Pre-Anesthesia Assessment:                           - Prior to the procedure, a History and Physical                            was performed, and patient medications and                            allergies were reviewed. The patient's tolerance of                            previous anesthesia was also reviewed. The risks                            and benefits of the procedure and the sedation                            options and risks were discussed with the patient.                            All questions were answered, and informed consent                            was obtained. Prior Anticoagulants: The patient has                            taken no previous anticoagulant or antiplatelet                            agents. ASA Grade Assessment: II - A patient with  mild systemic disease. After reviewing the risks                            and benefits, the patient was deemed in                            satisfactory condition to undergo the procedure.                           After obtaining informed consent, the colonoscope                            was passed under direct vision. Throughout the         procedure, the patient's blood pressure, pulse, and                            oxygen saturations were monitored continuously. The                            Model CF-HQ190L [id number]) scope was introduced                            through the anus and advanced to the the cecum,                            identified by appendiceal orifice and ileocecal                            valve. The ileocecal valve, appendiceal orifice,                            and rectum were photographed. The quality of the                            bowel preparation was excellent. The colonoscopy                            was performed without difficulty. The patient                            tolerated the procedure well. The bowel preparation                            used was SUPREP. Scope In: 11:33:38 AM Scope Out: 11:49:23 AM Scope Withdrawal Time: 0 hours 11 minutes 17 seconds  Total Procedure Duration: 0 hours 15 minutes 45 seconds  Findings:                 A 3 mm polyp was found in the distal transverse                            colon. The polyp was removed with a cold snare.  Resection and retrieval were complete.                           Diverticula were found in the sigmoid colon.                           The exam was otherwise without abnormality on                            direct and retroflexion views. Moderate internal                            hemorrhoids present. Complications:            No immediate complications. Estimated blood loss:                            None. Estimated Blood Loss:     Estimated blood loss: none. Impression:               - One 3 mm polyp in the distal transverse colon,                            removed with a cold snare. Resected and retrieved.                           - Diverticulosis in the sigmoid colon.                           - The examination was otherwise normal on direct                            and  retroflexion views. Recommendation:           - Repeat colonoscopy is not recommended for                            surveillance, given your age and favorable findings                            on today's exam.                           - Patient has a contact number available for                            emergencies. The signs and symptoms of potential                            delayed complications were discussed with the                            patient. Return to normal activities tomorrow.                            Written discharge instructions were provided to the  patient.                           - Resume previous diet.                           - Continue present medications.                           - Await pathology results. Docia Chuck. Henrene Pastor, MD 12/09/2016 11:56:53 AM This report has been signed electronically.

## 2016-12-09 NOTE — Progress Notes (Signed)
Report to PACU, RN, vss, BBS= Clear.  

## 2016-12-10 ENCOUNTER — Telehealth: Payer: Self-pay

## 2016-12-10 NOTE — Telephone Encounter (Signed)
  Follow up Call-  Call back number 12/09/2016  Post procedure Call Back phone  # [phone]  Permission to leave phone message Yes  Some recent data might be hidden     Patient questions:  Do you have a fever, pain , or abdominal swelling? No. Pain Score  0 *  Have you tolerated food without any problems? Yes.    Have you been able to return to your normal activities? Yes.    Do you have any questions about your discharge instructions: Diet   No. Medications  No. Follow up visit  No.  Do you have questions or concerns about your Care? No.  Actions: * If pain score is 4 or above: No action needed, pain <4.

## 2016-12-17 ENCOUNTER — Encounter: Payer: Self-pay | Admitting: Internal Medicine

## 2018-01-26 ENCOUNTER — Encounter: Payer: Self-pay | Admitting: Family

## 2018-01-26 ENCOUNTER — Ambulatory Visit: Payer: Medicare Other | Admitting: Family

## 2018-01-26 ENCOUNTER — Ambulatory Visit (HOSPITAL_COMMUNITY)
Admission: RE | Admit: 2018-01-26 | Discharge: 2018-01-26 | Disposition: A | Discharge status: Home / Self Care | Payer: Medicare Other | Source: Ambulatory Visit | Attending: Vascular Surgery | Admitting: Vascular Surgery

## 2018-01-26 VITALS — BP 156/86 | HR 63 | Resp 18 | Ht 69.0 in | Wt 180.5 lb

## 2018-01-26 DIAGNOSIS — Z9889 Other specified postprocedural states: Secondary | ICD-10-CM | POA: Diagnosis not present

## 2018-01-26 DIAGNOSIS — I6522 Occlusion and stenosis of left carotid artery: Secondary | ICD-10-CM

## 2018-01-26 DIAGNOSIS — I6521 Occlusion and stenosis of right carotid artery: Secondary | ICD-10-CM

## 2018-01-26 DIAGNOSIS — Z48812 Encounter for surgical aftercare following surgery on the circulatory system: Secondary | ICD-10-CM

## 2018-01-26 LAB — VAS US CAROTID
LCCADDIAS: -18 cm/s
LCCADSYS: -94 cm/s
LEFT ECA DIAS: -13 cm/s
LEFT VERTEBRAL DIAS: -14 cm/s
LICADDIAS: 29 cm/s
LICADSYS: 92 cm/s
LICAPDIAS: -12 cm/s
LICAPSYS: -42 cm/s
Left CCA prox dias: 14 cm/s
Left CCA prox sys: 98 cm/s
RIGHT CCA MID DIAS: 18 cm/s
RIGHT ECA DIAS: -11 cm/s
RIGHT VERTEBRAL DIAS: -15 cm/s
Right CCA prox dias: 15 cm/s
Right CCA prox sys: 87 cm/s
Right cca dist sys: -81 cm/s

## 2018-01-26 NOTE — Patient Instructions (Signed)

## 2018-01-26 NOTE — Progress Notes (Signed)
Chief Complaint: Follow up Extracranial Carotid Artery Stenosis   History of Present Illness  Blake Atkins is a 77 y.o. male who is status post right CEA in 2009 by Dr. Oneida Alar. He returns today for 2 year follow up.  He denies any history of TIA or stroke symptoms.Specifically he denies any history of amaurosis fugax or monocular blindness, unilateral facial drooping, hemiparesis, or receptive or expressive aphasia. He plays golf or exercises daily. He denies any cardiac problems, denies claudication symptoms with walking, denies non-healing wounds. He reports a history of dry eyes and states his eyes remain red looking.  Pt Diabetic: No Pt smoker: non-smoker  Pt meds include: Statin : Yes ASA: Yes Other anticoagulants/antiplatelets: no    Past Medical History:  Diagnosis Date  . Carotid artery occlusion   . Hyperlipidemia     Social History Social History   Tobacco Use  . Smoking status: Never Smoker  . Smokeless tobacco: Never Used  Substance Use Topics  . Alcohol use: No    Alcohol/week: 0.0 oz  . Drug use: No    Family History Family History  Problem Relation Age of Onset  . Diabetes Maternal Grandmother   . Cancer Mother        Lung  . Cancer Father        Abdominal   . Colon cancer Neg Hx     Surgical History Past Surgical History:  Procedure Laterality Date  . APPENDECTOMY    . CAROTID ENDARTERECTOMY Right 2009   CEA  . COLONOSCOPY    . EYE SURGERY Left Jan. 2014   Cataract and Lens implant  . EYE SURGERY Right Feb. 2014   Cataract and Lens implant  . KNEE SURGERY     right    No Known Allergies  Current Outpatient Medications  Medication Sig Dispense Refill  . acetaminophen (TYLENOL) 325 MG tablet Take 650 mg by mouth every 6 (six) hours as needed.    Marland Kitchen aspirin EC 81 MG tablet Take 81 mg by mouth daily.      . cycloSPORINE (RESTASIS) 0.05 % ophthalmic emulsion 1 drop as needed. Reported on 01/22/2016    . Omega-3 Fatty Acids  (FISH OIL) 1000 MG CAPS Take 1,000 mg by mouth daily.    . rosuvastatin (CRESTOR) 20 MG tablet 20 mg daily. Reported on 01/22/2016    . Coenzyme Q10 (CO Q-10) 100 MG CAPS Take by mouth.    . simvastatin (ZOCOR) 40 MG tablet Take 40 mg by mouth at bedtime. Reported on 01/22/2016     Current Facility-Administered Medications  Medication Dose Route Frequency Provider Last Rate Last Dose  . 0.9 %  sodium chloride infusion  500 mL Intravenous Continuous Irene Shipper, MD        Review of Systems : See HPI for pertinent positives and negatives.  Physical Examination  Vitals:   01/26/18 1329 01/26/18 1336  BP: (!) 190/86 (!) 156/86  Pulse: 63   Resp: 18   SpO2: 100%   Weight: 180 lb 8 oz (81.9 kg)   Height: 5\' 9"  (1.753 m)    Body mass index is 26.66 kg/m.  General: WDWN male in NAD, fit appearing and younger appearing than stated age. GAIT: normal HENT: No apparent abnormalities  Eyes: PERRLA.  Pulmonary: Respirations are non labored, CTAB, good air movement in all fields  Cardiac: regular rhythm, no detected murmur.  VASCULAR EXAM Carotid Bruits Left Right   Negative Negative    Abdominal aortic  pulse is not palpable. Radial pulses are 3+ palpable and equal.      LE Pulses LEFT RIGHT   POPLITEAL not palpable  not palpable   POSTERIOR TIBIAL  palpable   palpable    DORSALIS PEDIS  ANTERIOR TIBIAL palpable  palpable     Gastrointestinal: soft, nontender, BS WNL, no r/g, no palpable masses. Musculoskeletal: No muscle atrophy/wasting. M/S 5/5 throughout, Extremities without ischemic changes. Skin: No rash, no cellulitis, no ulcers Neurologic: A&O X 3; appropriate affect, normal sensation, speech is normal CN 2-12 intact except is slightly hard of hearing, Pain and light touch intact in  extremities, Motor exam as listed above  Psychiatric: Normal/rambling thought content, loquacious, animated, mood appropriate to clinical situation.     Assessment: Blake Atkins is a 77 y.o. male who is status post right CEA in 2009. He has no history of stroke or TIA.  Fortunately he has never used tobacco and does not have DM. He exercises daily and is fit appearing and younger in appearance than his stated age.   Pt performed 25 push ups to demonstrate his physical fitness.  DATA Carotid Duplex (01/26/18): Right CIA: (CEA site), 1-39% stenosis Left ICA: 1-39% stenosis. Bilateral vertebral artery flow is antegrade.  Bilateral subclavian artery waveforms are normal.  No significant change compared to the exam on 01-22-16.     Plan: Follow-up in 2 years with Carotid Duplex scan.  I discussed in depth with the patient the nature of atherosclerosis, and emphasized the importance of maximal medical management including strict control of blood pressure, blood glucose, and lipid levels, obtaining regular exercise, and continued cessation of smoking.  The patient is aware that without maximal medical management the underlying atherosclerotic disease process will progress, limiting the benefit of any interventions. The patient was given information about stroke prevention and what symptoms should prompt the patient to seek immediate medical care. Thank you for allowing [place] to participate in this patient's care.  Clemon Chambers, RN, MSN, FNP-C [facility] Office: [facility] Physician: Oneida Alar  01/26/18 1:42 PM

## 2020-01-15 ENCOUNTER — Ambulatory Visit: Payer: Medicare PPO | Attending: Internal Medicine

## 2020-01-15 DIAGNOSIS — Z23 Encounter for immunization: Secondary | ICD-10-CM

## 2020-01-15 NOTE — Progress Notes (Signed)
   Covid-19 Vaccination Clinic  Name:  Blake Atkins    MRN: [id number] DOB: 24-Jun-1941  01/15/2020  Blake Atkins was observed post Covid-19 immunization for 15 minutes without incidence. He was provided with Vaccine Information Sheet and instruction to access the V-Safe system.   Blake Atkins was instructed to call 911 with any severe reactions post vaccine: Marland Kitchen Difficulty breathing  . Swelling of your face and throat  . A fast heartbeat  . A bad rash all over your body  . Dizziness and weakness    Immunizations Administered    Name Date Dose VIS Date Route   Pfizer COVID-19 Vaccine 01/15/2020  9:22 AM 0.3 mL 12/07/2019 Intramuscular   Manufacturer: [employer]   Lot: [id number]   [place]: [id number]

## 2020-01-30 ENCOUNTER — Ambulatory Visit (HOSPITAL_COMMUNITY)
Admission: RE | Admit: 2020-01-30 | Discharge: 2020-01-30 | Disposition: A | Discharge status: Home / Self Care | Payer: Medicare PPO | Source: Ambulatory Visit | Attending: Vascular Surgery | Admitting: Vascular Surgery

## 2020-01-30 ENCOUNTER — Other Ambulatory Visit: Payer: Self-pay

## 2020-01-30 ENCOUNTER — Ambulatory Visit: Payer: Medicare PPO | Admitting: Physician Assistant

## 2020-01-30 VITALS — BP 167/98 | HR 72 | Temp 98.1°F | Resp 18 | Ht 70.0 in | Wt 169.0 lb

## 2020-01-30 DIAGNOSIS — I6529 Occlusion and stenosis of unspecified carotid artery: Secondary | ICD-10-CM | POA: Diagnosis present

## 2020-01-30 DIAGNOSIS — I6523 Occlusion and stenosis of bilateral carotid arteries: Secondary | ICD-10-CM

## 2020-01-30 NOTE — Progress Notes (Signed)
History of Present Illness:  Patient is a 79 y.o. year old male who presents for evaluation of carotid stenosis.  He is status post right CEA in 2009 by Dr. Oneida Alar.      The patient denies symptoms of TIA, amaurosis, or stroke.  He denise medical changes since his last visit.  He continues to do virous exercise daily.  He is on a Stain and Asprin daily.     Past Medical History:  Diagnosis Date  . Carotid artery occlusion   . Hyperlipidemia     Past Surgical History:  Procedure Laterality Date  . APPENDECTOMY    . CAROTID ENDARTERECTOMY Right 2009   CEA  . COLONOSCOPY    . EYE SURGERY Left Jan. 2014   Cataract and Lens implant  . EYE SURGERY Right Feb. 2014   Cataract and Lens implant  . KNEE SURGERY     right     Social History Social History   Tobacco Use  . Smoking status: Never Smoker  . Smokeless tobacco: Never Used  Substance Use Topics  . Alcohol use: No    Alcohol/week: 0.0 standard drinks  . Drug use: No    Family History Family History  Problem Relation Age of Onset  . Diabetes Maternal Grandmother   . Cancer Mother        Lung  . Cancer Father        Abdominal   . Colon cancer Neg Hx     Allergies  No Known Allergies   Current Outpatient Medications  Medication Sig Dispense Refill  . acetaminophen (TYLENOL) 325 MG tablet Take 650 mg by mouth every 6 (six) hours as needed.    Marland Kitchen aspirin EC 81 MG tablet Take 81 mg by mouth daily.      . cycloSPORINE (RESTASIS) 0.05 % ophthalmic emulsion 1 drop as needed. Reported on 01/22/2016    . Multiple Vitamins-Minerals (PRESERVISION AREDS 2) CAPS     . Omega-3 Fatty Acids (FISH OIL) 1000 MG CAPS Take 1,000 mg by mouth daily.    . rosuvastatin (CRESTOR) 20 MG tablet 20 mg daily. Reported on 01/22/2016    . Coenzyme Q10 (CO Q-10) 100 MG CAPS Take by mouth.    . simvastatin (ZOCOR) 40 MG tablet Take 40 mg by mouth at bedtime. Reported on 01/22/2016     Current Facility-Administered Medications   Medication Dose Route Frequency Provider Last Rate Last Admin  . 0.9 %  sodium chloride infusion  500 mL Intravenous Continuous Irene Shipper, MD        ROS:   General:  No weight loss, Fever, chills  HEENT: No recent headaches, no nasal bleeding, no visual changes, no sore throat  Neurologic: No dizziness, blackouts, seizures. No recent symptoms of stroke or mini- stroke. No recent episodes of slurred speech, or temporary blindness.  Cardiac: No recent episodes of chest pain/pressure, no shortness of breath at rest.  No shortness of breath with exertion.  Denies history of atrial fibrillation or irregular heartbeat  Vascular: No history of rest pain in feet.  No history of claudication.  No history of non-healing ulcer, No history of DVT   Pulmonary: No home oxygen, no productive cough, no hemoptysis,  No asthma or wheezing  Musculoskeletal:  [ ]  Arthritis, [ ]  Low back pain,  [ ]  Joint pain  Hematologic:No history of hypercoagulable state.  No history of easy bleeding.  No history of anemia  Gastrointestinal: No hematochezia  or melena,  No gastroesophageal reflux, no trouble swallowing  Urinary: [ ]  chronic Kidney disease, [ ]  on HD - [ ]  MWF or [ ]  TTHS, [ ]  Burning with urination, [ ]  Frequent urination, [ ]  Difficulty urinating;   Skin: No rashes  Psychological: No history of anxiety,  No history of depression   Physical Examination  Vitals:   01/30/20 1526  Resp: 18  SpO2: 98%  Weight: 169 lb (76.7 kg)  Height: 5\' 10"  (1.778 m)    Body mass index is 24.25 kg/m.  General:  Alert and oriented, no acute distress HEENT: Normal Neck: No bruit or JVD Pulmonary: Clear to auscultation bilaterally Cardiac: Regular Rate and Rhythm without murmur Gastrointestinal: Soft, non-tender, non-distended, no mass, no scars Skin: No rash Extremity Pulses:  2+ radial, brachial, femoral, dorsalis pedis, posterior tibial pulses bilaterally Musculoskeletal: No deformity or  edema  Neurologic: Upper and lower extremity motor 5/5 and symmetric  DATA:     Right Carotid Findings:  +----------+--------+--------+--------+------------------+--------+       PSV cm/sEDV cm/sStenosisPlaque DescriptionComments  +----------+--------+--------+--------+------------------+--------+  CCA Prox 98   15                      +----------+--------+--------+--------+------------------+--------+  CCA Mid  82   14                      +----------+--------+--------+--------+------------------+--------+  CCA Distal86   18       homogeneous          +----------+--------+--------+--------+------------------+--------+  ICA Prox 29   6    1-39%                 +----------+--------+--------+--------+------------------+--------+  ICA Mid  57   16                      +----------+--------+--------+--------+------------------+--------+  ICA Distal68   22                      +----------+--------+--------+--------+------------------+--------+  ECA    88   13                      +----------+--------+--------+--------+------------------+--------+   +----------+--------+-------+----------------+-------------------+       PSV cm/sEDV cmsDescribe    Arm Pressure (mmHG)  +----------+--------+-------+----------------+-------------------+  Subclavian152   12   Multiphasic, WNL            +----------+--------+-------+----------------+-------------------+   +---------+--------+--+--------+-+---------+  VertebralPSV cm/s46EDV cm/s9Antegrade  +---------+--------+--+--------+-+---------+      Left Carotid Findings:  +----------+--------+--------+--------+-----------------------+--------+       PSV cm/sEDV cm/sStenosisPlaque  Description   Comments  +----------+--------+--------+--------+-----------------------+--------+  CCA Prox 93   16                         +----------+--------+--------+--------+-----------------------+--------+  CCA Mid  99   21       smooth and heterogenous      +----------+--------+--------+--------+-----------------------+--------+  CCA Distal83   21       smooth and heterogenous      +----------+--------+--------+--------+-----------------------+--------+  ICA Prox 70   12   1-39%  homogeneous            +----------+--------+--------+--------+-----------------------+--------+  ICA Mid  83   16                         +----------+--------+--------+--------+-----------------------+--------+  ICA Distal75   21                         +----------+--------+--------+--------+-----------------------+--------+  ECA    97   14                         +----------+--------+--------+--------+-----------------------+--------+   +----------+--------+--------+---------+-------------------+       PSV cm/sEDV cm/sDescribe Arm Pressure (mmHG)  +----------+--------+--------+---------+-------------------+  Subclavian160       Turbulent            +----------+--------+--------+---------+-------------------+   +---------+--------+--+--------+-+---------+  VertebralPSV cm/s53EDV cm/s9Antegrade  +---------+--------+--+--------+-+---------+         Summary:  Right Carotid: Velocities in the right ICA are consistent with a 1-39%  stenosis.         CEA patent.   Left Carotid: Velocities in the left ICA are consistent with a 1-39%  stenosis.   Vertebrals: Bilateral vertebral arteries demonstrate antegrade flow.  Subclavians: Left subclavian artery  flow was disturbed. Normal flow  hemodynamics        were seen in the right subclavian artery.   ASSESSMENT:  Asymptomatic Carotid stenosis s/p right CEA by Dr. Oneida Alar in 2009   PLAN: He is doing well without symptoms of stroke or TIA.  No recurrent stenosis right ICA and no significant stenosis left ICA.  He will return in 2 years for repeat carotid duplex.  If he devlops symptoms of stroke or TIA he will call 911.  Continue Zocor and Asprin daily.    Roxy Horseman PA-C [facility] Office: [phone]  MD on call Early

## 2020-01-31 ENCOUNTER — Other Ambulatory Visit: Payer: Self-pay | Admitting: *Deleted

## 2020-01-31 DIAGNOSIS — I6523 Occlusion and stenosis of bilateral carotid arteries: Secondary | ICD-10-CM

## 2020-02-05 ENCOUNTER — Ambulatory Visit: Payer: Medicare PPO | Attending: Internal Medicine

## 2020-02-05 DIAGNOSIS — Z23 Encounter for immunization: Secondary | ICD-10-CM | POA: Insufficient documentation

## 2020-02-05 NOTE — Progress Notes (Signed)
   Covid-19 Vaccination Clinic  Name:  Blake Atkins    MRN: [id number] DOB: Feb 14, 1941  02/05/2020  Blake Atkins was observed post Covid-19 immunization for 15 minutes without incidence. He was provided with Vaccine Information Sheet and instruction to access the V-Safe system.   Blake Atkins was instructed to call 911 with any severe reactions post vaccine: Marland Kitchen Difficulty breathing  . Swelling of your face and throat  . A fast heartbeat  . A bad rash all over your body  . Dizziness and weakness    Immunizations Administered    Name Date Dose VIS Date Route   Pfizer COVID-19 Vaccine 02/05/2020  8:19 AM 0.3 mL 12/07/2019 Intramuscular   Manufacturer: [place]   Lot: [id number]   [place]: [id number]

## 2020-05-02 ENCOUNTER — Other Ambulatory Visit: Payer: Self-pay | Admitting: Internal Medicine

## 2020-05-02 DIAGNOSIS — F0390 Unspecified dementia without behavioral disturbance: Secondary | ICD-10-CM

## 2020-05-29 ENCOUNTER — Ambulatory Visit
Admission: RE | Admit: 2020-05-29 | Discharge: 2020-05-29 | Disposition: A | Discharge status: Home / Self Care | Payer: Medicare PPO | Source: Ambulatory Visit | Attending: Internal Medicine | Admitting: Internal Medicine

## 2020-05-29 DIAGNOSIS — F0390 Unspecified dementia without behavioral disturbance: Secondary | ICD-10-CM

## 2020-05-29 MED ORDER — GADOBENATE DIMEGLUMINE 529 MG/ML IV SOLN
16.0000 mL | Freq: Once | INTRAVENOUS | Status: AC | PRN
  Administered 2020-05-29: 16 mL via INTRAVENOUS

## 2020-07-16 DIAGNOSIS — I10 Essential (primary) hypertension: Secondary | ICD-10-CM | POA: Diagnosis not present

## 2020-07-16 DIAGNOSIS — I951 Orthostatic hypotension: Secondary | ICD-10-CM | POA: Diagnosis not present

## 2020-07-16 DIAGNOSIS — N529 Male erectile dysfunction, unspecified: Secondary | ICD-10-CM | POA: Diagnosis not present

## 2020-07-16 DIAGNOSIS — Z7982 Long term (current) use of aspirin: Secondary | ICD-10-CM | POA: Diagnosis not present

## 2020-07-16 DIAGNOSIS — Z809 Family history of malignant neoplasm, unspecified: Secondary | ICD-10-CM | POA: Diagnosis not present

## 2020-07-16 DIAGNOSIS — F0390 Unspecified dementia without behavioral disturbance: Secondary | ICD-10-CM | POA: Diagnosis not present

## 2020-07-16 DIAGNOSIS — Z85828 Personal history of other malignant neoplasm of skin: Secondary | ICD-10-CM | POA: Diagnosis not present

## 2020-07-16 DIAGNOSIS — E785 Hyperlipidemia, unspecified: Secondary | ICD-10-CM | POA: Diagnosis not present

## 2020-10-06 IMAGING — MR MR HEAD WO/W CM
12 series · 48 of 48 positions shown · IV contrast (multihance)
Comparison: None.

CLINICAL DATA: 78-year-old male with memory difficulty for 2 years.

EXAM:
MRI HEAD WITHOUT AND WITH CONTRAST
TECHNIQUE: Multiplanar, multiecho pulse sequences of the brain and surrounding
structures were obtained without and with intravenous contrast.
CONTRAST:  16mL MULTIHANCE GADOBENATE DIMEGLUMINE 529 MG/ML IV SOLN

[Series 2: T1 · sagittal · 5.0mm · 0.45mm/px · 1 of 23 slices shown]
[im 1/23]
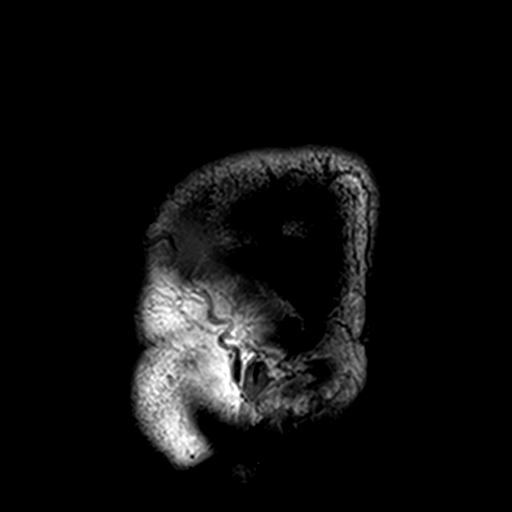

[Series 3: DWI · axial · 3.0mm · 1.80mm/px · z∈[-53,+100]mm · 7 of 104 slices shown (1 of 4)]
[im 1/104]
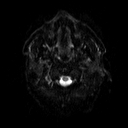
[im 18/104]
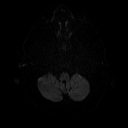
[im 35/104]
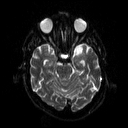
[im 52/104]
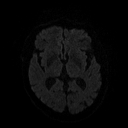
[im 69/104]
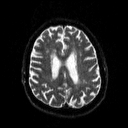
[im 86/104]
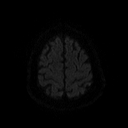
[im 104/104]
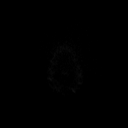

[Series 4: DWI · axial · 3.0mm · 1.80mm/px · z∈[-53,+100]mm · 3 of 50 slices shown (2 of 4)]
[im 1/50]
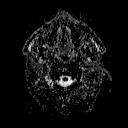
[im 25/50]
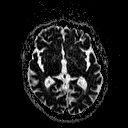
[im 50/50]
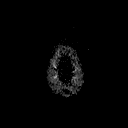

[Series 5: DWI · coronal · 5.0mm · 1.80mm/px · 5 of 68 slices shown (3 of 4)]
[im 1/68]
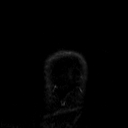
[im 17/68]
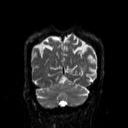
[im 34/68]
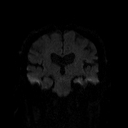
[im 51/68]
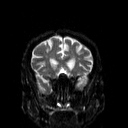
[im 68/68]
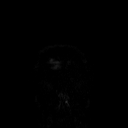

[Series 6: DWI · coronal · 5.0mm · 1.80mm/px · 2 of 34 slices shown (4 of 4)]
[im 1/34]
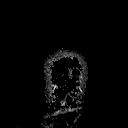
[im 34/34]
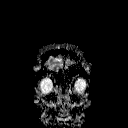

[Series 7: T2 · axial · 5.0mm · 0.51mm/px · 1 of 22 slices shown (1 of 2)]
[im 1/22]
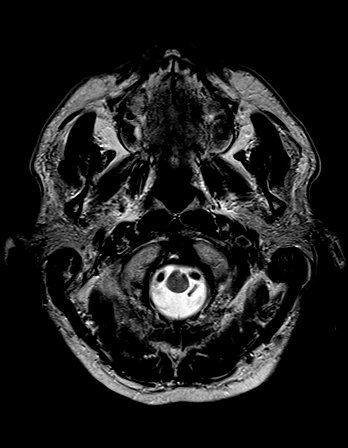

[Series 8: FLAIR · axial · 3.0mm · 0.45mm/px · z∈[-53,+100]mm · 2 of 34 slices shown]
[im 1/34]
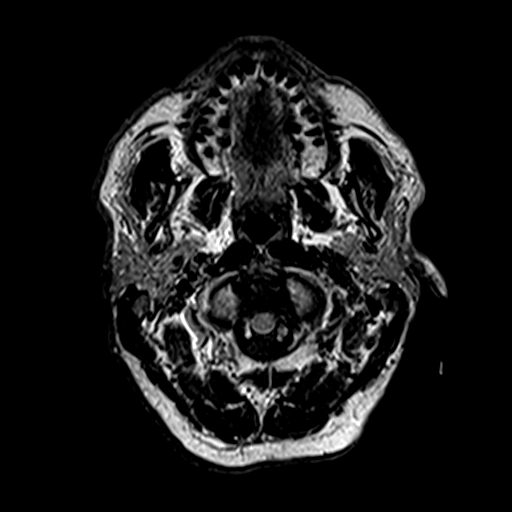
[im 34/34]
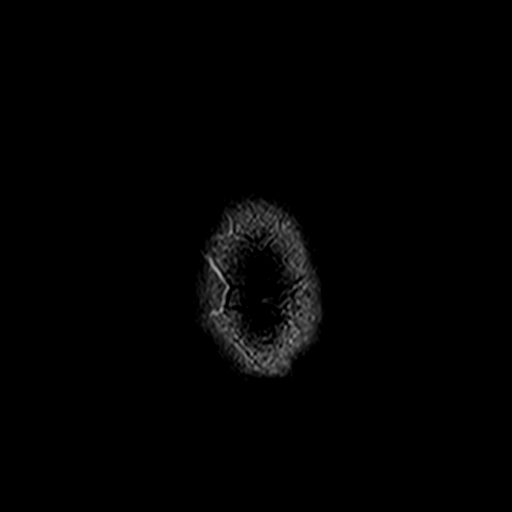

[Series 10: swi_images · axial · 4.0mm · 0.90mm/px · z∈[-54,+101]mm · 3 of 40 slices shown]
[im 1/40]
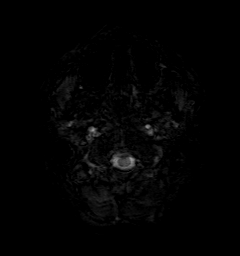
[im 20/40]
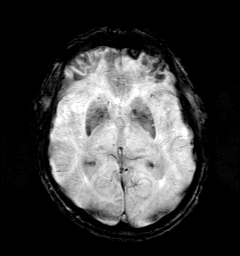
[im 40/40]
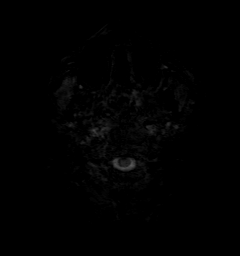

[Series 11: t1_mpr_tra · axial · 1.0mm · 0.75mm/px · z∈[-47,+96]mm · 10 of 144 slices shown (1 of 2)]
[im 1/144]
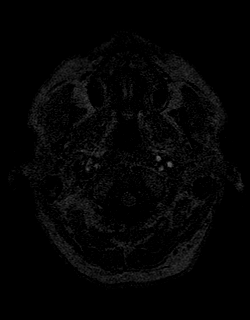
[im 16/144]
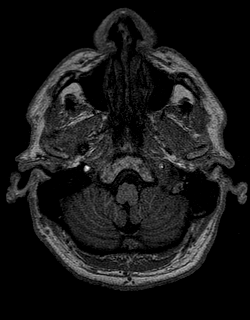
[im 32/144]
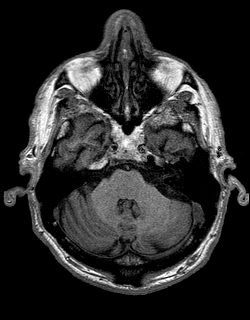
[im 48/144]
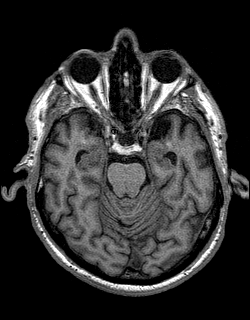
[im 64/144]
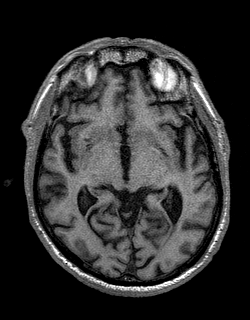
[im 80/144]
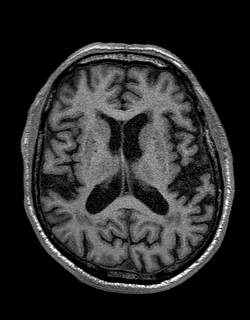
[im 96/144]
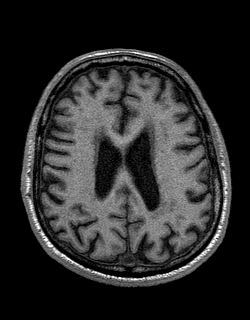
[im 112/144]
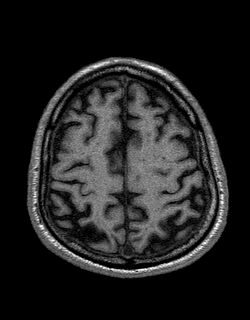
[im 128/144]
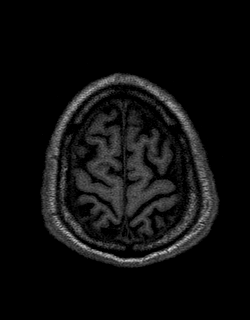
[im 144/144]
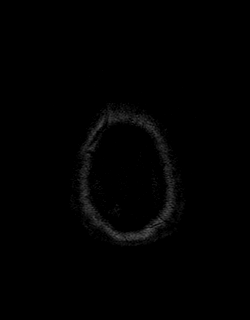

[Series 12: T2 · coronal · 5.0mm · 0.45mm/px · 2 of 28 slices shown (2 of 2)]
[im 1/28]
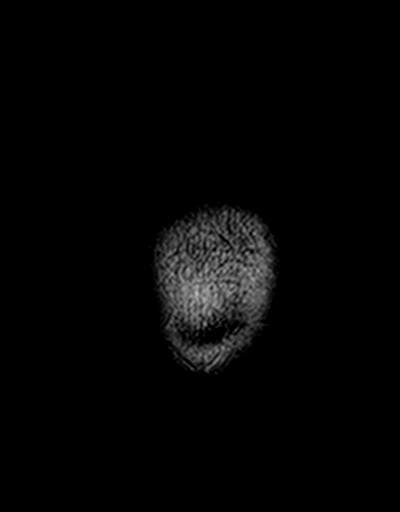
[im 28/28]
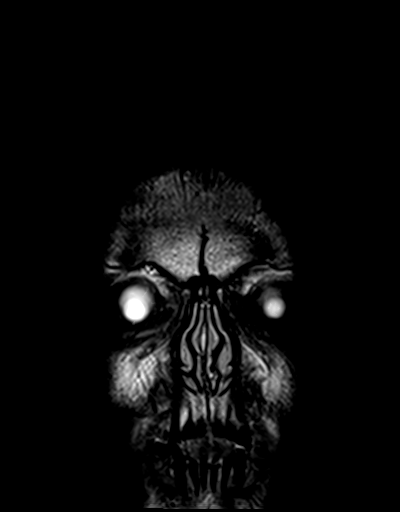

[Series 13: t1_mpr_tra · axial · 1.0mm · 0.75mm/px · z∈[-47,+96]mm · 10 of 144 slices shown (2 of 2)]
[im 1/144]
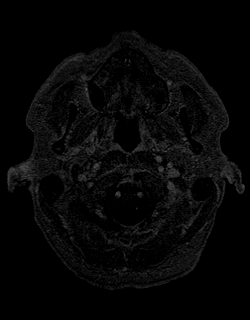
[im 16/144]
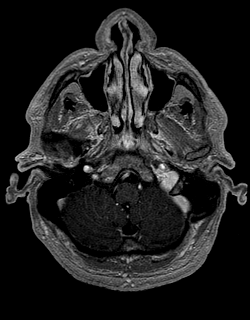
[im 32/144]
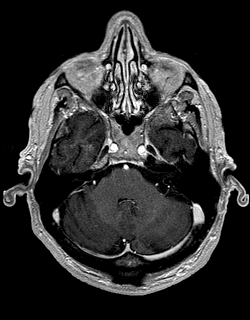
[im 48/144]
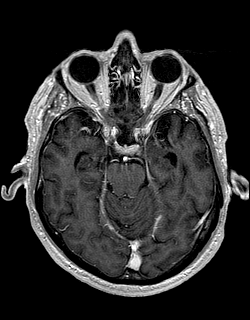
[im 64/144]
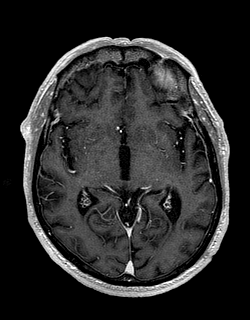
[im 80/144]
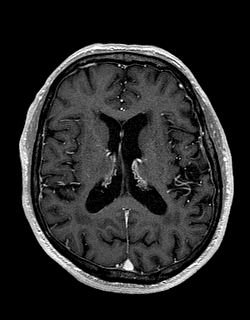
[im 96/144]
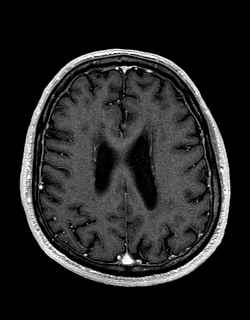
[im 112/144]
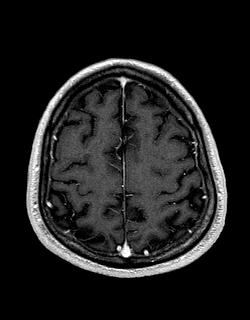
[im 128/144]
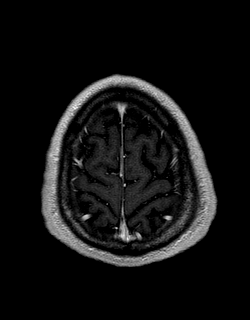
[im 144/144]
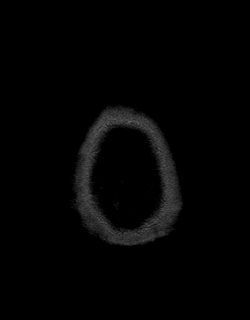

[Series 14: post cor · coronal · 5.0mm · 0.45mm/px · 2 of 28 slices shown]
[im 1/28]
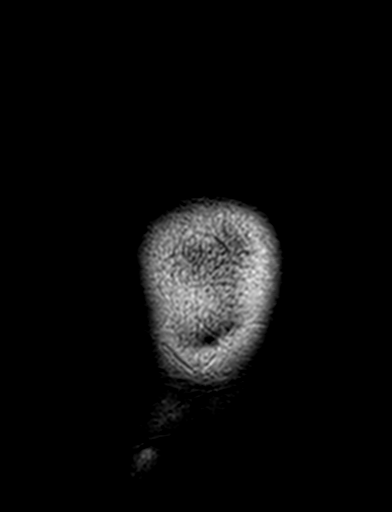
[im 28/28]
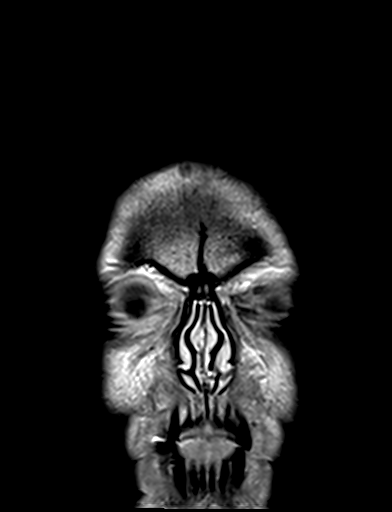

[48 of 48 positions shown; findings below may reference images not displayed]

FINDINGS: Brain: No restricted diffusion to suggest acute infarction. No
midline shift, mass effect, evidence of mass lesion,
ventriculomegaly, extra-axial collection or acute intracranial
hemorrhage. Cervicomedullary junction and pituitary are within
normal limits.

No disproportionate areas of cerebral volume loss are identified.
There is patchy and scattered bilateral cerebral white matter T2 and
FLAIR hyperintensity, mostly periventricular. The extent is mild to
moderate for age. There is a small area of superior right frontal
gyrus cortical encephalomalacia identified on series 8, image 27.
But no chronic cerebral blood products or other cortical
encephalomalacia. Deep gray nuclei, brainstem, cerebellum and mesial
temporal lobe structures appear normal for age.

No abnormal enhancement identified.  No dural thickening.

Vascular: Major intracranial vascular flow voids are preserved. The
major dural venous sinuses are enhancing and appear to be patent.

Skull and upper cervical spine: Negative for age visible cervical
spine, bone marrow signal.

Sinuses/Orbits: Postoperative changes to both globes, otherwise
negative orbits. Trace paranasal sinus mucosal thickening.

Other: Mastoids are clear. Visible internal auditory structures
appear normal. Scalp and face soft tissues appear negative.
IMPRESSION: 1. No acute intracranial abnormality.
2. Mild to moderate for age signal changes in the cerebral white
matter, most commonly due to chronic small vessel disease, and a
subtle chronic infarct in the right frontal lobe.

## 2020-10-11 DIAGNOSIS — Z23 Encounter for immunization: Secondary | ICD-10-CM | POA: Diagnosis not present

## 2021-04-28 DIAGNOSIS — E785 Hyperlipidemia, unspecified: Secondary | ICD-10-CM | POA: Diagnosis not present

## 2021-04-28 DIAGNOSIS — Z125 Encounter for screening for malignant neoplasm of prostate: Secondary | ICD-10-CM | POA: Diagnosis not present

## 2021-05-05 DIAGNOSIS — R82998 Other abnormal findings in urine: Secondary | ICD-10-CM | POA: Diagnosis not present

## 2021-05-05 DIAGNOSIS — I6529 Occlusion and stenosis of unspecified carotid artery: Secondary | ICD-10-CM | POA: Diagnosis not present

## 2021-05-05 DIAGNOSIS — N1831 Chronic kidney disease, stage 3a: Secondary | ICD-10-CM | POA: Diagnosis not present

## 2021-05-05 DIAGNOSIS — Z0000 Encounter for general adult medical examination without abnormal findings: Secondary | ICD-10-CM | POA: Diagnosis not present

## 2021-05-05 DIAGNOSIS — G20 Parkinson's disease: Secondary | ICD-10-CM | POA: Diagnosis not present

## 2021-05-05 DIAGNOSIS — F0390 Unspecified dementia without behavioral disturbance: Secondary | ICD-10-CM | POA: Diagnosis not present

## 2021-05-05 DIAGNOSIS — E785 Hyperlipidemia, unspecified: Secondary | ICD-10-CM | POA: Diagnosis not present

## 2021-05-05 DIAGNOSIS — I129 Hypertensive chronic kidney disease with stage 1 through stage 4 chronic kidney disease, or unspecified chronic kidney disease: Secondary | ICD-10-CM | POA: Diagnosis not present

## 2021-05-05 DIAGNOSIS — Z1212 Encounter for screening for malignant neoplasm of rectum: Secondary | ICD-10-CM | POA: Diagnosis not present

## 2021-05-05 DIAGNOSIS — H04129 Dry eye syndrome of unspecified lacrimal gland: Secondary | ICD-10-CM | POA: Diagnosis not present

## 2021-05-05 DIAGNOSIS — R972 Elevated prostate specific antigen [PSA]: Secondary | ICD-10-CM | POA: Diagnosis not present

## 2021-05-08 NOTE — Progress Notes (Signed)
Assessment/Plan:   1.  Essential tremor  -Reassured patient and wife that I do not see any evidence of Parkinson's disease.  Discussed nature and pathophysiology.  Patient education provided.  Discussed treatments for essential tremor.  Tremor is really not bothersome to patient and I would not recommend any treatment right now.  They can let me know if this gets worse and requires treatment in the future.  2.  Dementia  -on donepezil  -Concerned about the fact that the patient is still driving and expressed this to them today.  Expressed that I would recommend a medical driving evaluation.  If that is declined, then I would recommend no driving at all.  Patient/wife expressed understanding of these recommendations and they were written down on the after visit summary.  3.  REM behavior disorder  -This is commonly associated with PD although patient does not have Parkinson's disease at this point in time.  Wife actually thinks that donepezil has helped some.  We discussed that melatonin can help as well.  She is going to try this.  Discussed bedroom safety.  4.  Follow-up with me on an as-needed basis.   Subjective:   Blake Atkins was seen today in the movement disorders clinic for neurologic consultation at the request of Ginger Organ., MD.  The consultation is for the evaluation of rest tremor, history of memory change (on donepezil), cogwheel rigidity and vivid dreams and to rule out Parkinson's disease.  Medical records made available to me have been reviewed.  This patient is accompanied in the office by his spouse who supplements the history.    Specific Symptoms:  Tremor: Yes.  , doesn't bother patient so he doesn't know how long going on but wife states that she feels that came on after R CEA 10 years ago.  Was L handed in school but really is ambidexterous.  Both hands shake equally but "it don't bother me."  Wife states that they shake independent of one another.  Wife  states mostly shake at rest.  Wife states that he will switch hands when eating because of tremor.  He will change hands when putting hands on the tee.   Family hx of similar:  No. Voice: no change but did recently get hearing aids Sleep:   Vivid Dreams:  Yes.    Acting out dreams:  Yes.   , only 1 time fall out of the bed Postural symptoms:  No.(when asked about this, he gets out of the chair and hops on one foot and then does many push ups on the floor)  Falls?  One time, in shower (either tripped over the lip of the shower or tripped over rug and when wife got there he was on floor and said "i'm just tired") Bradykinesia symptoms: no bradykinesia noted (wife states that doesn't shuffle but doesn't pick up feet like used to - walks [place]) Loss of smell:  Yes.   per wife Loss of taste:  No. Urinary Incontinence:  No. Difficulty Swallowing:  No. but admits to having an "ocean of saliva" but may drool some Trouble with ADL's:  No.  Trouble buttoning clothing: No. Depression:  No. but wife thinks more anxious than in the past Memory changes:  Yes.  , on donepezil - wife always done finances, cooking and always has; pt does do some of the driving  - golf course and friends house. N/V:  No. Diplopia:  No.  Dyskinesia:  No. Prior exposure to reglan/antipsychotics: No.   MRI brain was completed on May 29, 2020.  I personally reviewed that.  There was moderate small vessel disease.  PREVIOUS MEDICATIONS: none to date  ALLERGIES:  No Known Allergies  CURRENT MEDICATIONS:  Current Outpatient Medications  Medication Instructions  . acetaminophen (TYLENOL) 650 mg, Oral, Every 6 hours PRN  . aspirin EC 81 mg, Oral, Daily,    . cycloSPORINE (RESTASIS) 0.05 % ophthalmic emulsion 1 drop, As needed, Reported on 01/22/2016  . donepezil (ARICEPT) 5 mg, Oral, Daily at bedtime  . Multiple Vitamins-Minerals (PRESERVISION AREDS 2) CAPS No dose, route, or frequency recorded.  .  rosuvastatin (CRESTOR) 20 mg, Daily, Reported on 01/22/2016    Objective:   VITALS:   Vitals:   05/12/21 0840  BP: 118/84  Pulse: 62  SpO2: 99%  Weight: 179 lb (81.2 kg)  Height: 5\' 9"  (1.753 m)    GEN:  The patient appears stated age and is in NAD. HEENT:  Normocephalic, atraumatic.  The mucous membranes are moist. The superficial temporal arteries are without ropiness or tenderness. CV:  RRR Lungs:  CTAB Neck/HEME:  There are no carotid bruits bilaterally.  Neurological examination:  Orientation: The patient is alert and oriented to person/place.  He is verbose, but has trouble staying on topic. Cranial nerves: There is good facial symmetry. Extraocular muscles are intact. The visual fields are full to confrontational testing. The speech is clear but he has some occasional dysphasia but mostly the speech is fluent.  Soft palate rises symmetrically and there is no tongue deviation. Hearing is intact to conversational tone. Sensation: Sensation is intact to light and pinprick throughout (facial, trunk, extremities). Vibration is intact at the bilateral big toe. There is no extinction with double simultaneous stimulation. There is no sensory dermatomal level identified. Motor: Strength is 5/5 in the bilateral upper and lower extremities.   Shoulder shrug is equal and symmetric.  There is no pronator drift. Deep tendon reflexes: Deep tendon reflexes are 2-/4 at the bilateral biceps, triceps, brachioradialis, patella and achilles. Plantar responses are downgoing bilaterally.  Movement examination: Tone: There is normal tone in the bilateral upper extremities.  The tone in the lower extremities is normal.  Mild gegenhalten on testing. Abnormal movements: there is no rest tremor.  There is mild postural tremor.  There is mild to mod intention tremor on the L.  There is trouble with archimedes spirals mostly on the L.  He has significant trouble pouring water from 1 glass to another without  spilling it, especially if the water is in the left hand, but both hands have tremor. Coordination:  There is no decremation with RAM's, with any form of RAMS, including alternating supination and pronation of the forearm, hand opening and closing, finger taps, heel taps and toe taps.  There is some apraxia when asked to perform these commands. Gait and Station: The patient has no difficulty arising out of a deep-seated chair without the use of the hands. The patient's stride length is good with good arm swing.   I have reviewed and interpreted the following labs independently   Chemistry      Component Value Date/Time   NA 136 10/29/2008 0435   K 3.8 10/29/2008 0435   CL 106 10/29/2008 0435   CO2 25 10/29/2008 0435   BUN 12 10/29/2008 0435   CREATININE 1.24 10/29/2008 0435      Component Value Date/Time   CALCIUM 8.4 10/29/2008 0435  ALKPHOS 53 10/25/2008 0956   AST 27 10/25/2008 0956   ALT 25 10/25/2008 0956   BILITOT 1.2 10/25/2008 0956      No results found for: TSH Lab Results  Component Value Date   WBC 10.0 10/29/2008   HGB 12.7 (L) 10/29/2008   HCT 36.9 (L) 10/29/2008   MCV 100.5 (H) 10/29/2008   PLT 181 10/29/2008   Lab work from primary care was completed on Apr 28, 2021.  Sodium was 140, potassium 4.7, chloride 110, CO2 24, BUN 25, AST 25, ALT 23, creatinine 1.5, white blood cells 5.7, hemoglobin 14.4, hematocrit 44.4, platelets 253, TSH 1.60  Total time spent on today's visit was 48 minutes, including both face-to-face time and nonface-to-face time.  Time included that spent on review of records (prior notes available to me/labs/imaging if pertinent), discussing treatment and goals, answering patient's questions and coordinating care.  Cc:  Ginger Organ., MD

## 2021-05-12 ENCOUNTER — Encounter: Payer: Self-pay | Admitting: Neurology

## 2021-05-12 ENCOUNTER — Other Ambulatory Visit: Payer: Self-pay

## 2021-05-12 ENCOUNTER — Ambulatory Visit: Payer: Medicare PPO | Admitting: Neurology

## 2021-05-12 VITALS — BP 118/84 | HR 62 | Ht 69.0 in | Wt 179.0 lb

## 2021-05-12 DIAGNOSIS — G4752 REM sleep behavior disorder: Secondary | ICD-10-CM | POA: Diagnosis not present

## 2021-05-12 DIAGNOSIS — G309 Alzheimer's disease, unspecified: Secondary | ICD-10-CM

## 2021-05-12 DIAGNOSIS — G250 Essential tremor: Secondary | ICD-10-CM

## 2021-05-12 DIAGNOSIS — F0280 Dementia in other diseases classified elsewhere without behavioral disturbance: Secondary | ICD-10-CM | POA: Diagnosis not present

## 2021-05-12 NOTE — Patient Instructions (Signed)
1.  I recommend no driving unless a formal driving evaluation is completed 2.  You can try melatonin, 3 mg at night for the dreaming. 3.  It was so nice to meet you!   Essential Tremor A tremor is trembling or shaking that a person cannot control. Most tremors affect the hands or arms. Tremors can also affect the head, vocal cords, legs, and other parts of the body. Essential tremor is a tremor without a known cause. Usually, it occurs while a person is trying to perform an action. It tends to get worse gradually as a person ages. What are the causes? The cause of this condition is not known. What increases the risk? You are more likely to develop this condition if:  You have a family member with essential tremor.  You are age 57 or older.  You take certain medicines. What are the signs or symptoms? The main sign of a tremor is a rhythmic shaking of certain parts of your body that is uncontrolled and unintentional. You may:  Have difficulty eating with a spoon or fork.  Have difficulty writing.  Nod your head up and down or side to side.  Have a quivering voice. The shaking may:  Get worse over time.  Come and go.  Be more noticeable on one side of your body.  Get worse due to stress, fatigue, caffeine, and extreme heat or cold. How is this diagnosed? This condition may be diagnosed based on:  Your symptoms and medical history.  A physical exam. There is no single test to diagnose an essential tremor. However, your health care provider may order tests to rule out other causes of your condition. These may include:  Blood and urine tests.  Imaging studies of your brain, such as CT scan and MRI.  A test that measures involuntary muscle movement (electromyogram).   How is this treated? Treatment for essential tremor depends on the severity of the condition.  Some tremors may go away without treatment.  Mild tremors may not need treatment if they do not affect your  day-to-day life.  Severe tremors may need to be treated using one or more of the following options: ? Medicines. ? Lifestyle changes. ? Occupational or physical therapy. Follow these instructions at home: Lifestyle  Do not use any products that contain nicotine or tobacco, such as cigarettes and e-cigarettes. If you need help quitting, ask your health care provider.  Limit your caffeine intake as told by your health care provider.  Try to get 8 hours of sleep each night.  Find ways to manage your stress that fits your lifestyle and personality. Consider trying meditation or yoga.  Try to anticipate stressful situations and allow extra time to manage them.  If you are struggling emotionally with the effects of your tremor, consider working with a mental health provider.   General instructions  Take over-the-counter and prescription medicines only as told by your health care provider.  Avoid extreme heat and extreme cold.  Keep all follow-up visits as told by your health care provider. This is important. Visits may include physical therapy visits. Contact a health care provider if:  You experience any changes in the location or intensity of your tremors.  You start having a tremor after starting a new medicine.  You have tremor with other symptoms, such as: ? Numbness. ? Tingling. ? Pain. ? Weakness.  Your tremor gets worse.  Your tremor interferes with your daily life.  You feel down, blue,  or sad for at least 2 weeks in a row.  Worrying about your tremor and what other people think about you interferes with your everyday life functions, including relationships, work, or school. Summary  Essential tremor is a tremor without a known cause. Usually, it occurs when you are trying to perform an action.  You are more likely to develop this condition if you have a family member with essential tremor.  The main sign of a tremor is a rhythmic shaking of certain parts of  your body that is uncontrolled and unintentional.  Treatment for essential tremor depends on the severity of the condition. This information is not intended to replace advice given to you by your health care provider. Make sure you discuss any questions you have with your health care provider. Document Revised: 09/05/2020 Document Reviewed: 09/05/2020 Elsevier Patient Education  2021 [employer].

## 2021-06-17 DIAGNOSIS — H04123 Dry eye syndrome of bilateral lacrimal glands: Secondary | ICD-10-CM | POA: Diagnosis not present

## 2021-06-17 DIAGNOSIS — H26493 Other secondary cataract, bilateral: Secondary | ICD-10-CM | POA: Diagnosis not present

## 2021-06-17 DIAGNOSIS — H52203 Unspecified astigmatism, bilateral: Secondary | ICD-10-CM | POA: Diagnosis not present

## 2021-06-17 DIAGNOSIS — H353132 Nonexudative age-related macular degeneration, bilateral, intermediate dry stage: Secondary | ICD-10-CM | POA: Diagnosis not present

## 2021-06-30 DIAGNOSIS — H26491 Other secondary cataract, right eye: Secondary | ICD-10-CM | POA: Diagnosis not present

## 2021-07-09 DIAGNOSIS — L821 Other seborrheic keratosis: Secondary | ICD-10-CM | POA: Diagnosis not present

## 2021-07-09 DIAGNOSIS — L720 Epidermal cyst: Secondary | ICD-10-CM | POA: Diagnosis not present

## 2021-07-09 DIAGNOSIS — D1801 Hemangioma of skin and subcutaneous tissue: Secondary | ICD-10-CM | POA: Diagnosis not present

## 2021-07-09 DIAGNOSIS — L570 Actinic keratosis: Secondary | ICD-10-CM | POA: Diagnosis not present

## 2021-07-21 DIAGNOSIS — H26492 Other secondary cataract, left eye: Secondary | ICD-10-CM | POA: Diagnosis not present

## 2021-07-28 DIAGNOSIS — L720 Epidermal cyst: Secondary | ICD-10-CM | POA: Diagnosis not present

## 2021-07-28 DIAGNOSIS — L821 Other seborrheic keratosis: Secondary | ICD-10-CM | POA: Diagnosis not present

## 2022-01-21 ENCOUNTER — Ambulatory Visit: Payer: Medicare PPO | Admitting: Physician Assistant

## 2022-01-21 ENCOUNTER — Ambulatory Visit (HOSPITAL_COMMUNITY)
Admission: RE | Admit: 2022-01-21 | Discharge: 2022-01-21 | Disposition: A | Discharge status: Home / Self Care | Payer: Medicare PPO | Source: Ambulatory Visit | Attending: Vascular Surgery | Admitting: Vascular Surgery

## 2022-01-21 ENCOUNTER — Other Ambulatory Visit: Payer: Self-pay

## 2022-01-21 VITALS — BP 113/68 | HR 51 | Temp 98.1°F | Ht 69.0 in | Wt 182.3 lb

## 2022-01-21 DIAGNOSIS — I6523 Occlusion and stenosis of bilateral carotid arteries: Secondary | ICD-10-CM

## 2022-01-21 NOTE — Progress Notes (Signed)
HISTORY AND PHYSICAL     CC:  follow up. Requesting Provider:  Ginger Organ., MD  HPI: This is a 81 y.o. male here for follow up for carotid artery stenosis.  Pt is s/p right CEA for asymptomatic carotid artery stenosis on 10/28/2008 by Dr. Oneida Alar.    Pt was last seen in 2021 and at that time he was doing well with 1-39% bilateral ICA stenosis.  He was asymptomatic.  Pt returns today for follow up.    Pt denies any amaurosis fugax, speech difficulties, weakness, numbness, paralysis or clumsiness or facial droop.  He continues to do push ups and crunches and remains active.  The pt is  on a statin for cholesterol management.  The pt is on a daily aspirin.   Other AC:  none The pt is not on medication for hypertension.   The pt is not diabetic.   Tobacco hx:  never  Pt does not have family hx of AAA.  Past Medical History:  Diagnosis Date   Carotid artery occlusion    Dementia ([place])    Hyperlipidemia     Past Surgical History:  Procedure Laterality Date   APPENDECTOMY     CAROTID ENDARTERECTOMY Right 2009   CEA   COLONOSCOPY     EYE SURGERY Left Jan. 2014   Cataract and Lens implant   EYE SURGERY Right Feb. 2014   Cataract and Lens implant   KNEE SURGERY     right    No Known Allergies  Current Outpatient Medications  Medication Sig Dispense Refill   acetaminophen (TYLENOL) 325 MG tablet Take 650 mg by mouth every 6 (six) hours as needed.     aspirin EC 81 MG tablet Take 81 mg by mouth daily.     donepezil (ARICEPT) 10 MG tablet Take 5 mg by mouth at bedtime.     Multiple Vitamins-Minerals (PRESERVISION AREDS 2) CAPS      Polyethyl Glyc-Propyl Glyc PF (SYSTANE ULTRA PF) 0.4-0.3 % SOLN one dop in each eye once or twice a day     rosuvastatin (CRESTOR) 20 MG tablet 20 mg daily. Reported on 01/22/2016     Current Facility-Administered Medications  Medication Dose Route Frequency Provider Last Rate Last Admin   0.9 %  sodium chloride infusion  500 mL  Intravenous Continuous Irene Shipper, MD        Family History  Problem Relation Age of Onset   Diabetes Maternal Grandmother    Cancer Mother        Lung   Cancer Father        Abdominal    Colon cancer Neg Hx     Social History   Socioeconomic History   Marital status: Married    Spouse name: Not on file   Number of children: Not on file   Years of education: Not on file   Highest education level: Not on file  Occupational History   Not on file  Tobacco Use   Smoking status: Never   Smokeless tobacco: Never  Substance and Sexual Activity   Alcohol use: No    Alcohol/week: 0.0 standard drinks   Drug use: No   Sexual activity: Not on file  Other Topics Concern   Not on file  Social History Narrative   Right Handed for writing. Left handed for sports.   Lives in a one story    Social Determinants of Health   Financial Resource Strain: Not on file  Food  Insecurity: Not on file  Transportation Needs: Not on file  Physical Activity: Not on file  Stress: Not on file  Social Connections: Not on file  Intimate Partner Violence: Not on file     REVIEW OF SYSTEMS:   [X]  denotes positive finding, [ ]  denotes negative finding Cardiac  Comments:  Chest pain or chest pressure:    Shortness of breath upon exertion:    Short of breath when lying flat:    Irregular heart rhythm:        Vascular    Pain in calf, thigh, or hip brought on by ambulation:    Pain in feet at night that wakes you up from your sleep:     Blood clot in your veins:    Leg swelling:         Pulmonary    Oxygen at home:    Productive cough:     Wheezing:         Neurologic    Sudden weakness in arms or legs:     Sudden numbness in arms or legs:     Sudden onset of difficulty speaking or slurred speech:    Temporary loss of vision in one eye:     Problems with dizziness:         Gastrointestinal    Blood in stool:     Vomited blood:         Genitourinary    Burning when urinating:      Blood in urine:        Psychiatric    Major depression:         Hematologic    Bleeding problems:    Problems with blood clotting too easily:        Skin    Rashes or ulcers:        Constitutional    Fever or chills:      PHYSICAL EXAMINATION:  Today's Vitals   01/21/22 1329 01/21/22 1330  BP: 105/70 113/68  Pulse: (!) 52 (!) 51  Temp: 98.1 F (36.7 C)   Weight: 182 lb 4.8 oz (82.7 kg)   Height: 5\' 9"  (1.753 m)    Body mass index is 26.92 kg/m.   General:  WDWN in NAD; vital signs documented above Gait: Not observed HENT: WNL, normocephalic Pulmonary: normal non-labored breathing Cardiac: regular HR, without carotid bruits Abdomen: soft, NT; aortic pulse is not palpable Skin: without rashes Vascular Exam/Pulses:  Right Left  Radial 2+ (normal) 2+ (normal)    Musculoskeletal: no muscle wasting or atrophy  Neurologic: A&O X 3; moving all extremities equally; speech is fluent/normal Psychiatric:  The pt has Normal affect.   Non-Invasive Vascular Imaging:   Carotid Duplex on 01/21/2022: Right:  1-39% ICA stenosis Left:  1-39% ICA stenosis Vertebrals:  Bilateral vertebral arteries demonstrate antegrade flow.  Subclavians: Normal flow hemodynamics were seen in bilateral subclavian arteries.   Previous Carotid duplex on 01/30/2020: Right: 1-39% ICA stenosis Left:   1-39% ICA stenosis    ASSESSMENT/PLAN:: 81 y.o. male here for follow up carotid artery stenosis and is s/p right CEA for asymptomatic carotid artery stenosis on 10/28/2008 by Dr. Oneida Alar.  -duplex today reveals 1-39% bilateral ICA stenosis and he remains asymptomatic -discussed s/s of stroke with pt and he understands should he develop any of these sx, he will go to the nearest ER or call 911. -pt will f/u in 2 years with carotid duplex -pt will call sooner should they have any issues. -  continue statin/asa   Leontine Locket, [facility] [phone]  Clinic MD:  Stanford Breed  on call MD

## 2022-04-30 DIAGNOSIS — D074 Carcinoma in situ of penis: Secondary | ICD-10-CM | POA: Diagnosis not present

## 2022-04-30 DIAGNOSIS — D048 Carcinoma in situ of skin of other sites: Secondary | ICD-10-CM | POA: Diagnosis not present

## 2022-05-19 DIAGNOSIS — R7989 Other specified abnormal findings of blood chemistry: Secondary | ICD-10-CM | POA: Diagnosis not present

## 2022-05-19 DIAGNOSIS — Z125 Encounter for screening for malignant neoplasm of prostate: Secondary | ICD-10-CM | POA: Diagnosis not present

## 2022-05-19 DIAGNOSIS — E785 Hyperlipidemia, unspecified: Secondary | ICD-10-CM | POA: Diagnosis not present

## 2022-05-19 DIAGNOSIS — I10 Essential (primary) hypertension: Secondary | ICD-10-CM | POA: Diagnosis not present

## 2022-05-21 DIAGNOSIS — D074 Carcinoma in situ of penis: Secondary | ICD-10-CM | POA: Diagnosis not present

## 2022-05-25 ENCOUNTER — Other Ambulatory Visit: Payer: Self-pay | Admitting: Urology

## 2022-05-26 DIAGNOSIS — R972 Elevated prostate specific antigen [PSA]: Secondary | ICD-10-CM | POA: Diagnosis not present

## 2022-05-26 DIAGNOSIS — I129 Hypertensive chronic kidney disease with stage 1 through stage 4 chronic kidney disease, or unspecified chronic kidney disease: Secondary | ICD-10-CM | POA: Diagnosis not present

## 2022-05-26 DIAGNOSIS — E785 Hyperlipidemia, unspecified: Secondary | ICD-10-CM | POA: Diagnosis not present

## 2022-05-26 DIAGNOSIS — N1831 Chronic kidney disease, stage 3a: Secondary | ICD-10-CM | POA: Diagnosis not present

## 2022-05-26 DIAGNOSIS — F0390 Unspecified dementia without behavioral disturbance: Secondary | ICD-10-CM | POA: Diagnosis not present

## 2022-05-26 DIAGNOSIS — Z1331 Encounter for screening for depression: Secondary | ICD-10-CM | POA: Diagnosis not present

## 2022-05-26 DIAGNOSIS — R82998 Other abnormal findings in urine: Secondary | ICD-10-CM | POA: Diagnosis not present

## 2022-05-26 DIAGNOSIS — I10 Essential (primary) hypertension: Secondary | ICD-10-CM | POA: Diagnosis not present

## 2022-05-26 DIAGNOSIS — Z0000 Encounter for general adult medical examination without abnormal findings: Secondary | ICD-10-CM | POA: Diagnosis not present

## 2022-05-26 DIAGNOSIS — I6529 Occlusion and stenosis of unspecified carotid artery: Secondary | ICD-10-CM | POA: Diagnosis not present

## 2022-05-26 DIAGNOSIS — D074 Carcinoma in situ of penis: Secondary | ICD-10-CM | POA: Diagnosis not present

## 2022-05-26 DIAGNOSIS — Z1339 Encounter for screening examination for other mental health and behavioral disorders: Secondary | ICD-10-CM | POA: Diagnosis not present

## 2022-05-27 NOTE — Progress Notes (Signed)
Sent message, via epic in basket, requesting orders in epic from surgeon.  

## 2022-05-28 NOTE — Patient Instructions (Addendum)
DUE TO COVID-19 ONLY TWO VISITORS  (aged 80 and older)  ARE ALLOWED TO COME WITH YOU AND STAY IN THE WAITING ROOM ONLY DURING PRE OP AND PROCEDURE.   **NO VISITORS ARE ALLOWED IN THE SHORT STAY AREA OR RECOVERY ROOM!!**  IF YOU WILL BE ADMITTED INTO THE HOSPITAL YOU ARE ALLOWED ONLY FOUR SUPPORT PEOPLE DURING VISITATION HOURS ONLY (7 AM -8PM)   The support person(s) must pass our screening, gel in and out, and wear a mask at all times, including in the patient's room. Patients must also wear a mask when staff or their support person are in the room. Visitors GUEST BADGE MUST BE WORN VISIBLY  One adult visitor may remain with you overnight and MUST be in the room by 8 P.M.     Your procedure is scheduled on: 06/08/22   Report to [facility] Main Entrance    Report to admitting at : 9:15 AM   Call this number if you have problems the morning of surgery [phone]   Do not eat food :After Midnight.   After Midnight you may have the following liquids until : 8:30 AM DAY OF SURGERY  Water Black Coffee (sugar ok, NO MILK/CREAM OR CREAMERS)  Tea (sugar ok, NO MILK/CREAM OR CREAMERS) regular and decaf                             Plain Jell-O (NO RED)                                           Fruit ices (not with fruit pulp, NO RED)                                     Popsicles (NO RED)                                                                  Juice: apple, WHITE grape, WHITE cranberry Sports drinks like Gatorade (NO RED) Clear broth(vegetable,chicken,beef)    Oral Hygiene is also important to reduce your risk of infection.                                    Remember - BRUSH YOUR TEETH THE MORNING OF SURGERY WITH YOUR REGULAR TOOTHPASTE   Do NOT smoke after Midnight   Take these medicines the morning of surgery with A SIP OF WATER: Tylenol as needed.  DO NOT TAKE ANY ORAL DIABETIC MEDICATIONS DAY OF YOUR SURGERY  Bring CPAP mask and tubing day of surgery.                               You may not have any metal on your body including hair pins, jewelry, and body piercing             Do not wear lotions, powders, perfumes/cologne, or deodorant  Men may shave face and neck.   Do not bring valuables to the hospital. [place].   Contacts, dentures or bridgework may not be worn into surgery.   Bring small overnight bag day of surgery.    Patients discharged on the day of surgery will not be allowed to drive home.  Someone NEEDS to stay with you for the first 24 hours after anesthesia.   Special Instructions: Bring a copy of your healthcare power of attorney and living will documents         the day of surgery if you haven't scanned them before.              Please read over the following fact sheets you were given: IF YOU HAVE QUESTIONS ABOUT YOUR PRE-OP INSTRUCTIONS PLEASE CALL [phone]     [facility] - Preparing for Surgery Before surgery, you can play an important role.  Because skin is not sterile, your skin needs to be as free of germs as possible.  You can reduce the number of germs on your skin by washing with CHG (chlorahexidine gluconate) soap before surgery.  CHG is an antiseptic cleaner which kills germs and bonds with the skin to continue killing germs even after washing. Please DO NOT use if you have an allergy to CHG or antibacterial soaps.  If your skin becomes reddened/irritated stop using the CHG and inform your nurse when you arrive at Short Stay. Do not shave (including legs and underarms) for at least 48 hours prior to the first CHG shower.  You may shave your face/neck. Please follow these instructions carefully:  1.  Shower with CHG Soap the night before surgery and the  morning of Surgery.  2.  If you choose to wash your hair, wash your hair first as usual with your  normal  shampoo.  3.  After you shampoo, rinse your hair and body thoroughly to remove the  shampoo.                            4.  Use CHG as you would any other liquid soap.  You can apply chg directly  to the skin and wash                       Gently with a scrungie or clean washcloth.  5.  Apply the CHG Soap to your body ONLY FROM THE NECK DOWN.   Do not use on face/ open                           Wound or open sores. Avoid contact with eyes, ears mouth and genitals (private parts).                       Wash face,  Genitals (private parts) with your normal soap.             6.  Wash thoroughly, paying special attention to the area where your surgery  will be performed.  7.  Thoroughly rinse your body with warm water from the neck down.  8.  DO NOT shower/wash with your normal soap after using and rinsing off  the CHG Soap.  9.  Pat yourself dry with a clean towel.            10.  Wear clean pajamas.            11.  Place clean sheets on your bed the night of your first shower and do not  sleep with pets. Day of Surgery : Do not apply any lotions/deodorants the morning of surgery.  Please wear clean clothes to the hospital/surgery center.  FAILURE TO FOLLOW THESE INSTRUCTIONS MAY RESULT IN THE CANCELLATION OF YOUR SURGERY PATIENT SIGNATURE_________________________________  NURSE SIGNATURE__________________________________  ________________________________________________________________________

## 2022-05-31 ENCOUNTER — Encounter (HOSPITAL_COMMUNITY)
Admission: RE | Admit: 2022-05-31 | Discharge: 2022-05-31 | Disposition: A | Discharge status: Home / Self Care | Payer: Medicare PPO | Source: Ambulatory Visit | Attending: Urology | Admitting: Urology

## 2022-05-31 ENCOUNTER — Encounter (HOSPITAL_COMMUNITY): Payer: Self-pay

## 2022-05-31 ENCOUNTER — Other Ambulatory Visit: Payer: Self-pay

## 2022-05-31 VITALS — BP 91/61 | HR 54 | Temp 97.6°F | Ht 69.0 in | Wt 172.0 lb

## 2022-05-31 DIAGNOSIS — I2510 Atherosclerotic heart disease of native coronary artery without angina pectoris: Secondary | ICD-10-CM | POA: Insufficient documentation

## 2022-05-31 DIAGNOSIS — Z01818 Encounter for other preprocedural examination: Secondary | ICD-10-CM | POA: Diagnosis not present

## 2022-05-31 HISTORY — DX: Unspecified osteoarthritis, unspecified site: M19.90

## 2022-05-31 HISTORY — DX: Essential (primary) hypertension: I10

## 2022-05-31 HISTORY — DX: Chronic kidney disease, unspecified: N18.9

## 2022-05-31 LAB — BASIC METABOLIC PANEL
Anion gap: 6 (ref 5–15)
BUN: 22 mg/dL (ref 8–23)
CO2: 25 mmol/L (ref 22–32)
Calcium: 9.2 mg/dL (ref 8.9–10.3)
Chloride: 107 mmol/L (ref 98–111)
Creatinine, Ser: 1.64 mg/dL — ABNORMAL HIGH (ref 0.61–1.24)
GFR, Estimated: 42 mL/min — ABNORMAL LOW (ref >60)
Glucose, Bld: 117 mg/dL — ABNORMAL HIGH (ref 70–99)
Potassium: 5.7 mmol/L — ABNORMAL HIGH (ref 3.5–5.1)
Sodium: 138 mmol/L (ref 135–145)

## 2022-05-31 LAB — CBC
HCT: 44.1 % (ref 39.0–52.0)
Hemoglobin: 14.4 g/dL (ref 13.0–17.0)
MCH: 33.4 pg (ref 26.0–34.0)
MCHC: 32.7 g/dL (ref 30.0–36.0)
MCV: 102.3 fL — ABNORMAL HIGH (ref 80.0–100.0)
Platelets: 253 10*3/uL (ref 150–400)
RBC: 4.31 MIL/uL (ref 4.22–5.81)
RDW: 12.1 % (ref 11.5–15.5)
WBC: 5.4 10*3/uL (ref 4.0–10.5)
nRBC: 0 % (ref 0.0–0.2)

## 2022-05-31 NOTE — Progress Notes (Signed)
For Short Stay: [place] appointment date: Date of COVID positive in last 15 days:  Bowel Prep reminder:   For Anesthesia: PCP - Dr. Marton Redwood Cardiologist -   Chest x-ray -  EKG -  Stress Test -  ECHO -  Cardiac Cath -  Pacemaker/ICD device last checked: Pacemaker orders received: Device Rep notified:  Spinal Cord Stimulator:  Sleep Study -  CPAP -   Fasting Blood Sugar -  Checks Blood Sugar _____ times a day Date and result of last Hgb A1c-  Blood Thinner Instructions: Dr. Claudia Desanctis Aspirin Instructions: Will be held 5 days before surgery. Last Dose:  Activity level: Can go up a flight of stairs and activities of daily living without stopping and without chest pain and/or shortness of breath   Able to exercise without chest pain and/or shortness of breath   Unable to go up a flight of stairs without chest pain and/or shortness of breath     Anesthesia review: Hx: HTN,Dementia  Patient denies shortness of breath, fever, cough and chest pain at PAT appointment   Patient verbalized understanding of instructions that were given to them at the PAT appointment. Patient was also instructed that they will need to review over the PAT instructions again at home before surgery.

## 2022-05-31 NOTE — Progress Notes (Signed)
Lab. Results: Potassium: 5.7

## 2022-06-07 NOTE — H&P (Signed)
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 This was biopsied by dermatology and diagnosis confirmed. He is not experiencing any pain. This was initially treated by Dr. Risa Grill in 2013. He is not on any blood thinners.     ALLERGIES: No Allergies    MEDICATIONS: Adult Aspirin Low Strength 81 MG TBDP Oral  Aspirin Ec 81 mg tablet, delayed release  Crestor 40 MG Oral Tablet Oral  Donepezil Hcl 10 mg tablet  Fish Oil CAPS Oral  Olmesartan Medoxomil 20 mg tablet  Rosuvastatin Calcium 20 mg tablet  Tylenol     GU PSH: None     PSH Notes: Heart Surgery- right carotid artery (2013), Foot Surgery, Knee Surgery (1963)   NON-GU PSH: Cataract surgery, 2018     GU PMH: None     PMH Notes:  1898-12-27 00:00:00 - Note: Normal Routine History And Physical Senior Citizen [phone])  2012-07-25 09:05:09 - Note: Bowen's Disease Of The Penis   NON-GU PMH: Personal history of other endocrine, nutritional and metabolic disease, History of hypercholesterolemia - 2014 Hypercholesterolemia Hypertension    FAMILY HISTORY: Cancer - Father Death In The Family Father - Father Death In The Family Mother - Mother Diabetes - Grandmother Family Health Status Number - Runs In Family Lung Cancer - Mother   SOCIAL HISTORY: Marital Status: Married Preferred Language: English; Ethnicity: Not Hispanic Or Latino; Race: White Current Smoking Status: Patient has never smoked.   Tobacco Use Assessment Completed: Used Tobacco in last 30 days? Has never drank.  Drinks 1 caffeinated drink per day. 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     Notes: Never A Smoker, Alcohol Use, Tobacco Use, Marital History - Currently Married, Occupation:, Caffeine Use   REVIEW OF SYSTEMS:    GU Review Male:   Patient reports hard to postpone urination and get up at night to urinate. Patient denies frequent urination, burning/ pain with urination, leakage of  urine, stream starts and stops, trouble starting your stream, have to strain to urinate , erection problems, and penile pain.  Gastrointestinal (Upper):   Patient denies nausea, vomiting, and indigestion/ heartburn.  Gastrointestinal (Lower):   Patient denies diarrhea and constipation.  Constitutional:   Patient denies fever, night sweats, weight loss, and fatigue.  Skin:   Patient denies skin rash/ lesion and itching.  Eyes:   Patient denies blurred vision and double vision.  Ears/ Nose/ Throat:   Patient denies sore throat and sinus problems.  Hematologic/Lymphatic:   Patient denies swollen glands and easy bruising.  Cardiovascular:   Patient denies leg swelling and chest pains.  Respiratory:   Patient denies cough and shortness of breath.  Endocrine:   Patient denies excessive thirst.  Musculoskeletal:   Patient denies back pain and joint pain.  Neurological:   Patient denies headaches and dizziness.  Psychologic:   Patient denies depression and anxiety.   VITAL SIGNS:      05/21/2022 03:07 PM  Weight 178 lb / 80.74 kg  Height 70 in / 177.8 cm  BP 152/82 mmHg  Pulse 53 /min  Temperature 98.4 F / 36.8 C  BMI 25.5 kg/m   GU PHYSICAL EXAMINATION:    Penis: circ, phallus, lesion on left distal shaft, skin if lax enough to excise and reapproximate primarily   MULTI-SYSTEM PHYSICAL EXAMINATION:    Constitutional: Well-nourished. No physical deformities. Normally developed. Good grooming.  Neck: Neck symmetrical, not swollen. Normal tracheal position.  Respiratory: No  labored breathing, no use of accessory muscles.   Skin: No paleness, no jaundice, no cyanosis. No lesion, no ulcer, no rash.  Neurologic / Psychiatric: Oriented to time, oriented to place, oriented to person. No depression, no anxiety, no agitation.  Eyes: Normal conjunctivae. Normal eyelids.  Ears, Nose, Mouth, and Throat: Left ear no scars, no lesions, no masses. Right ear no scars, no lesions, no masses. Nose no scars,  no lesions, no masses. Normal hearing. Normal lips.  Musculoskeletal: Normal gait and station of head and neck.     Complexity of Data:  Records Review:   Previous Patient Records, POC Tool  Urine Test Review:   Urinalysis  Notes:                     04/28/21: BUN 25, Cr 1.2   PROCEDURES:          Urinalysis Dipstick Dipstick Cont'd  Color: Straw Bilirubin: Neg mg/dL  Appearance: Clear Ketones: Neg mg/dL  Specific Gravity: 1.010 Blood: Neg ery/uL  pH: 6.0 Protein: Neg mg/dL  Glucose: Neg mg/dL Urobilinogen: 0.2 mg/dL    Nitrites: Neg    Leukocyte Esterase: Neg leu/uL    ASSESSMENT:      ICD-10 Details  1 GU:   Carcinoma in situ of penis - D07.4 Undiagnosed New Problem   PLAN:           Document Letter(s):  Created for Patient: Clinical Summary         Notes:   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 Skin is lax enough to excise area of concern and reapproximate primarily. Risks and benefits of the procedure were discussed with the patient and his wife. We decided to proceed in the operating room however only using local anesthesia. Patient be scheduled for the next available date.

## 2022-06-08 ENCOUNTER — Ambulatory Visit (HOSPITAL_COMMUNITY)
Admission: RE | Admit: 2022-06-08 | Discharge: 2022-06-08 | Disposition: A | Discharge status: Home / Self Care | Payer: Medicare PPO | Source: Ambulatory Visit | Attending: Urology | Admitting: Urology

## 2022-06-08 ENCOUNTER — Encounter (HOSPITAL_COMMUNITY): Payer: Self-pay | Admitting: Urology

## 2022-06-08 ENCOUNTER — Other Ambulatory Visit: Payer: Self-pay

## 2022-06-08 ENCOUNTER — Encounter (HOSPITAL_COMMUNITY)
Admission: RE | Disposition: A | Discharge status: Home / Self Care | Payer: Self-pay | Source: Ambulatory Visit | Attending: Urology

## 2022-06-08 ENCOUNTER — Ambulatory Visit (HOSPITAL_COMMUNITY): Payer: Medicare PPO | Admitting: Certified Registered Nurse Anesthetist

## 2022-06-08 ENCOUNTER — Ambulatory Visit (HOSPITAL_BASED_OUTPATIENT_CLINIC_OR_DEPARTMENT_OTHER): Payer: Medicare PPO | Admitting: Certified Registered Nurse Anesthetist

## 2022-06-08 DIAGNOSIS — D074 Carcinoma in situ of penis: Secondary | ICD-10-CM | POA: Diagnosis not present

## 2022-06-08 DIAGNOSIS — C602 Malignant neoplasm of body of penis: Secondary | ICD-10-CM | POA: Diagnosis not present

## 2022-06-08 DIAGNOSIS — I10 Essential (primary) hypertension: Secondary | ICD-10-CM | POA: Insufficient documentation

## 2022-06-08 DIAGNOSIS — M1990 Unspecified osteoarthritis, unspecified site: Secondary | ICD-10-CM | POA: Diagnosis not present

## 2022-06-08 DIAGNOSIS — C609 Malignant neoplasm of penis, unspecified: Secondary | ICD-10-CM | POA: Diagnosis not present

## 2022-06-08 DIAGNOSIS — N289 Disorder of kidney and ureter, unspecified: Secondary | ICD-10-CM | POA: Diagnosis not present

## 2022-06-08 HISTORY — PX: PENILE BIOPSY: SHX6013

## 2022-06-08 SURGERY — BIOPSY, PENIS
Anesthesia: Monitor Anesthesia Care | Site: Penis

## 2022-06-08 MED ORDER — PROPOFOL 500 MG/50ML IV EMUL
INTRAVENOUS | Status: AC
  Filled 2022-06-08: qty 50

## 2022-06-08 MED ORDER — PROPOFOL 500 MG/50ML IV EMUL
INTRAVENOUS | Status: DC | PRN
  Administered 2022-06-08: 25 ug/kg/min via INTRAVENOUS

## 2022-06-08 MED ORDER — BACITRACIN ZINC 500 UNIT/GM EX OINT
TOPICAL_OINTMENT | CUTANEOUS | Status: AC
  Filled 2022-06-08: qty 28.35

## 2022-06-08 MED ORDER — BUPIVACAINE HCL (PF) 0.25 % IJ SOLN
INTRAMUSCULAR | Status: DC | PRN
  Administered 2022-06-08: 10 mL

## 2022-06-08 MED ORDER — LACTATED RINGERS IV SOLN: INTRAVENOUS | Status: DC

## 2022-06-08 MED ORDER — EPHEDRINE SULFATE-NACL 50-0.9 MG/10ML-% IV SOSY
PREFILLED_SYRINGE | INTRAVENOUS | Status: DC | PRN
  Administered 2022-06-08: 5 mg via INTRAVENOUS

## 2022-06-08 MED ORDER — BUPIVACAINE HCL 0.25 % IJ SOLN
INTRAMUSCULAR | Status: AC
  Filled 2022-06-08: qty 1

## 2022-06-08 MED ORDER — PROPOFOL 10 MG/ML IV BOLUS
INTRAVENOUS | Status: DC | PRN
  Administered 2022-06-08: 20 mg via INTRAVENOUS

## 2022-06-08 MED ORDER — ONDANSETRON HCL 4 MG/2ML IJ SOLN
INTRAMUSCULAR | Status: AC
  Filled 2022-06-08: qty 2

## 2022-06-08 MED ORDER — LIDOCAINE HCL 1 % IJ SOLN
INTRAMUSCULAR | Status: DC | PRN
  Administered 2022-06-08: 10 mL

## 2022-06-08 MED ORDER — LIDOCAINE HCL (PF) 1 % IJ SOLN
INTRAMUSCULAR | Status: AC
  Filled 2022-06-08: qty 30

## 2022-06-08 MED ORDER — CEFAZOLIN SODIUM-DEXTROSE 2-4 GM/100ML-% IV SOLN
2.0000 g | INTRAVENOUS | Status: AC
  Administered 2022-06-08: 2 g via INTRAVENOUS
  Filled 2022-06-08: qty 100

## 2022-06-08 MED ORDER — BUPIVACAINE HCL (PF) 0.25 % IJ SOLN
INTRAMUSCULAR | Status: AC
  Filled 2022-06-08: qty 30

## 2022-06-08 MED ORDER — ORAL CARE MOUTH RINSE
15.0000 mL | Freq: Once | OROMUCOSAL | Status: AC
  Administered 2022-06-08: 15 mL via OROMUCOSAL

## 2022-06-08 MED ORDER — CHLORHEXIDINE GLUCONATE 0.12 % MT SOLN: 15.0000 mL | Freq: Once | OROMUCOSAL | Status: AC

## 2022-06-08 MED ORDER — EPHEDRINE 5 MG/ML INJ
INTRAVENOUS | Status: AC
  Filled 2022-06-08: qty 5

## 2022-06-08 SURGICAL SUPPLY — 24 items
BAG COUNTER SPONGE SURGICOUNT (BAG) IMPLANT
BLADE SURG 15 STRL LF DISP TIS (BLADE) ×1 IMPLANT
BLADE SURG 15 STRL SS (BLADE) ×1
BNDG COHESIVE 1X5 TAN STRL LF (GAUZE/BANDAGES/DRESSINGS) ×2 IMPLANT
COVER SURGICAL LIGHT HANDLE (MISCELLANEOUS) ×2 IMPLANT
DRAPE LAPAROTOMY T 98X78 PEDS (DRAPES) ×2 IMPLANT
ELECT REM PT RETURN 15FT ADLT (MISCELLANEOUS) ×2 IMPLANT
GAUZE SPONGE 4X4 12PLY STRL (GAUZE/BANDAGES/DRESSINGS) ×2 IMPLANT
GAUZE XEROFORM 1X8 LF (GAUZE/BANDAGES/DRESSINGS) ×2 IMPLANT
GLOVE BIO SURGEON STRL SZ 6.5 (GLOVE) ×2 IMPLANT
GOWN STRL REUS W/ TWL LRG LVL3 (GOWN DISPOSABLE) ×1 IMPLANT
GOWN STRL REUS W/TWL LRG LVL3 (GOWN DISPOSABLE) ×1
KIT BASIN OR (CUSTOM PROCEDURE TRAY) ×2 IMPLANT
KIT TURNOVER KIT A (KITS) IMPLANT
NEEDLE HYPO 22GX1.5 SAFETY (NEEDLE) ×2 IMPLANT
NS IRRIG 1000ML POUR BTL (IV SOLUTION) IMPLANT
PACK BASIC VI WITH GOWN DISP (CUSTOM PROCEDURE TRAY) ×2 IMPLANT
PENCIL SMOKE EVACUATOR (MISCELLANEOUS) IMPLANT
SUT CHROMIC 3 0 SH 27 (SUTURE) ×6 IMPLANT
SUT CHROMIC 4 0 SH 27 (SUTURE) ×2 IMPLANT
SYR BULB EAR ULCER 3OZ GRN STR (SYRINGE) ×2 IMPLANT
SYR CONTROL 10ML LL (SYRINGE) IMPLANT
TOWEL OR 17X26 10 PK STRL BLUE (TOWEL DISPOSABLE) ×2 IMPLANT
TOWEL OR NON WOVEN STRL DISP B (DISPOSABLE) ×2 IMPLANT

## 2022-06-08 NOTE — Anesthesia Procedure Notes (Signed)
Procedure Name: MAC Date/Time: 06/08/2022 12:15 PM  Performed by: Victoriano Lain, CRNAPre-anesthesia Checklist: Patient identified, Emergency Drugs available, Suction available, Patient being monitored and Timeout performed Patient Re-evaluated:Patient Re-evaluated prior to induction Oxygen Delivery Method: Simple face mask Placement Confirmation: positive ETCO2 and breath sounds checked- equal and bilateral Dental Injury: Teeth and Oropharynx as per pre-operative assessment

## 2022-06-08 NOTE — Discharge Instructions (Signed)
Postoperative excision of penile lesion  Wound:  In most cases your incision will have absorbable sutures that run along the course of your incision and will dissolve within the first 10-20 days. Some will fall out even earlier. Expect some redness as the sutures dissolved but this should occur only around the sutures. If there is generalized redness, especially with increasing pain or swelling, let us know. The penis will very likely get "black and blue" as the blood in the tissues spread. Sometimes the whole penis will turn colors. The black and blue is followed by a yellow and brown color. In time, all the discoloration will go away.  Diet:  You may return to your normal diet within 24 hours following your surgery. You may note some mild nausea and possibly vomiting the first 6-8 hours following surgery. This is usually due to the side effects of anesthesia, and will disappear quite soon. I would suggest clear liquids and a very light meal the first evening following your surgery.  Activity:  Your physical activity should be restricted the first 48 hours. During that time you should remain relatively inactive, moving about only when necessary. During the first 7-10 days following surgery he should avoid lifting any heavy objects (anything greater than 15 pounds), and avoid strenuous exercise. If you work, ask [place] specifically about your restrictions, both for work and home. We will write a note to your employer if needed.  Ice packs can be placed on and off over the penis for the first 48 hours to help relieve the pain and keep the swelling down. Frozen peas or corn in a ZipLock bag can be frozen, used and re-frozen. Fifteen minutes on and 15 minutes off is a reasonable schedule.   Hygiene:  You may shower 48 hours after your surgery. Tub bathing should be restricted until the seventh day.  Medication:  You will be sent home with some type of pain medication. In many cases you will be sent  home with a narcotic pain pill (Vicodin or Tylox). If the pain is not too bad, you may take either Tylenol (acetaminophen) or Advil (ibuprofen) which contain no narcotic agents, and might be tolerated a little better, with fewer side effects. If the pain medication you are sent home with does not control the pain, you will have to let us know. Some narcotic pain medications cannot be given or refilled by a phone call to a pharmacy.  Problems you should report to [place]:  Fever of 101.0 degrees Fahrenheit or greater. Moderate or severe swelling under the skin incision or involving the scrotum. Drug reaction such as hives, a rash, nausea or vomiting.

## 2022-06-08 NOTE — Interval H&P Note (Signed)
History and Physical Interval Note:  06/08/2022 11:43 AM  Blake Atkins  has presented today for surgery, with the diagnosis of [place].  The various methods of treatment have been discussed with the patient and family. After consideration of risks, benefits and other options for treatment, the patient has consented to  Procedure(s) with comments: EXCISION OF PENILE LESION (N/A) - 1 HR as a surgical intervention.  The patient's history has been reviewed, patient examined, no change in status, stable for surgery.  I have reviewed the patient's chart and labs.  Questions were answered to the patient's satisfaction.     [name] D Shanessa Hodak

## 2022-06-08 NOTE — Transfer of Care (Signed)
Immediate Anesthesia Transfer of Care Note  Patient: Blake Atkins  Procedure(s) Performed: EXCISION OF PENILE LESION (Penis)  Patient Location: PACU  Anesthesia Type:MAC  Level of Consciousness: awake, alert , oriented and patient cooperative  Airway & Oxygen Therapy: Patient Spontanous Breathing and Patient connected to face mask oxygen  Post-op Assessment: Report given to RN, Post -op Vital signs reviewed and stable and Patient moving all extremities  Post vital signs: Reviewed and stable  Last Vitals:  Vitals Value Taken Time  BP 121/71 06/08/22 1250  Temp    Pulse 62 06/08/22 1252  Resp 27 06/08/22 1252  SpO2 100 % 06/08/22 1252  Vitals shown include unvalidated device data.  Last Pain:  Vitals:   06/08/22 0914  TempSrc:   PainSc: 0-No pain         Complications: No notable events documented.

## 2022-06-08 NOTE — Anesthesia Postprocedure Evaluation (Signed)
Anesthesia Post Note  Patient: Blake Atkins  Procedure(s) Performed: EXCISION OF PENILE LESION (Penis)     Patient location during evaluation: PACU Anesthesia Type: MAC Pain management: pain level controlled Vital Signs Assessment: post-procedure vital signs reviewed and stable Respiratory status: spontaneous breathing Cardiovascular status: stable Postop Assessment: no apparent nausea or vomiting Anesthetic complications: no   No notable events documented.  Last Vitals:  Vitals:   06/08/22 1250 06/08/22 1300  BP: 121/71 118/71  Pulse: (!) 53 61  Resp: 16 12  Temp: 36.8 C   SpO2: 100% 100%    Last Pain:  Vitals:   06/08/22 1300  TempSrc:   PainSc: 0-No pain                 Dejah Droessler

## 2022-06-08 NOTE — Op Note (Signed)
Operative Note  Preoperative diagnosis:  1.  Bowen's disease of the penis  Postoperative diagnosis: 1.  Bowen's disease the penis  Procedure(s): 1.  Excision of carcinoma in situ penile shaft  Surgeon: Jacalyn Lefevre, MD  Assistants:  None  Anesthesia:  General  Complications:  None  EBL:  minimal  Specimens: 1. Penile lesion (CIS) approx 2 cm  Drains/Catheters: 1.  none  Intraoperative findings:   Bowen's disease of penis (carcinoma in situ)  Indication:  Blake Atkins is a 81 y.o. male with with a history of CIS of the penis noted to have recurrence which was confirmed on biopsy done by his dermatologist.  Description of procedure:  After risks and benefits of the procedure were discussed, informed consent was obtained.  The patient was taken to the operating room and light sedation was administered.  He was prepped and draped in usual sterile fashion and timeout was performed.  1% lidocaine and quarter percent Marcaine 50-50 were then used to anesthetize the area.  A marking pen was used to outline the area of concern which was clearly seen on the distal dorsal left penile shaft with a small area on the glans.  It was then sharply excised with a knife.  Hemostasis was achieved with the Bovie.  The skin edges were then reapproximated with 4-0 interrupted chromic suture.  Bacitracin was applied as dressing.  The patient tolerated the procedure well was transferred back in stable condition.  Plan:  discharge home

## 2022-06-08 NOTE — Anesthesia Preprocedure Evaluation (Signed)
Anesthesia Evaluation  Patient identified by MRN, date of birth, ID band Patient awake    Reviewed: Allergy & Precautions, NPO status   Airway Mallampati: II  TM Distance: >3 FB     Dental   Pulmonary neg pulmonary ROS,    breath sounds clear to auscultation       Cardiovascular hypertension,  Rhythm:Regular Rate:Normal     Neuro/Psych PSYCHIATRIC DISORDERS negative neurological ROS     GI/Hepatic negative GI ROS, Neg liver ROS,   Endo/Other    Renal/GU Renal disease     Musculoskeletal  (+) Arthritis ,   Abdominal   Peds  Hematology   Anesthesia Other Findings   Reproductive/Obstetrics                             Anesthesia Physical Anesthesia Plan  ASA: 3  Anesthesia Plan: MAC   Post-op Pain Management:    Induction: Intravenous  PONV Risk Score and Plan: Ondansetron and Propofol infusion  Airway Management Planned: Simple Face Mask  Additional Equipment:   Intra-op Plan:   Post-operative Plan:   Informed Consent: I have reviewed the patients History and Physical, chart, labs and discussed the procedure including the risks, benefits and alternatives for the proposed anesthesia with the patient or authorized representative who has indicated his/her understanding and acceptance.     Dental advisory given  Plan Discussed with: CRNA and Anesthesiologist  Anesthesia Plan Comments:         Anesthesia Quick Evaluation

## 2022-06-09 ENCOUNTER — Encounter (HOSPITAL_COMMUNITY): Payer: Self-pay | Admitting: Urology

## 2022-06-09 LAB — SURGICAL PATHOLOGY

## 2022-08-03 DIAGNOSIS — H04123 Dry eye syndrome of bilateral lacrimal glands: Secondary | ICD-10-CM | POA: Diagnosis not present

## 2022-08-03 DIAGNOSIS — H353132 Nonexudative age-related macular degeneration, bilateral, intermediate dry stage: Secondary | ICD-10-CM | POA: Diagnosis not present

## 2022-08-03 DIAGNOSIS — H52203 Unspecified astigmatism, bilateral: Secondary | ICD-10-CM | POA: Diagnosis not present

## 2022-08-03 DIAGNOSIS — Z961 Presence of intraocular lens: Secondary | ICD-10-CM | POA: Diagnosis not present

## 2022-10-02 DIAGNOSIS — Z23 Encounter for immunization: Secondary | ICD-10-CM | POA: Diagnosis not present

## 2022-10-20 DIAGNOSIS — D074 Carcinoma in situ of penis: Secondary | ICD-10-CM | POA: Diagnosis not present

## 2022-11-22 DIAGNOSIS — N481 Balanitis: Secondary | ICD-10-CM | POA: Diagnosis not present

## 2022-11-22 DIAGNOSIS — D074 Carcinoma in situ of penis: Secondary | ICD-10-CM | POA: Diagnosis not present

## 2023-01-19 DIAGNOSIS — D074 Carcinoma in situ of penis: Secondary | ICD-10-CM | POA: Diagnosis not present

## 2023-05-20 DIAGNOSIS — D074 Carcinoma in situ of penis: Secondary | ICD-10-CM | POA: Diagnosis not present

## 2023-06-10 DIAGNOSIS — Z125 Encounter for screening for malignant neoplasm of prostate: Secondary | ICD-10-CM | POA: Diagnosis not present

## 2023-06-10 DIAGNOSIS — I10 Essential (primary) hypertension: Secondary | ICD-10-CM | POA: Diagnosis not present

## 2023-06-10 DIAGNOSIS — E785 Hyperlipidemia, unspecified: Secondary | ICD-10-CM | POA: Diagnosis not present

## 2023-06-16 DIAGNOSIS — I10 Essential (primary) hypertension: Secondary | ICD-10-CM | POA: Diagnosis not present

## 2023-06-16 DIAGNOSIS — R4701 Aphasia: Secondary | ICD-10-CM | POA: Diagnosis not present

## 2023-06-16 DIAGNOSIS — E785 Hyperlipidemia, unspecified: Secondary | ICD-10-CM | POA: Diagnosis not present

## 2023-06-16 DIAGNOSIS — Z1339 Encounter for screening examination for other mental health and behavioral disorders: Secondary | ICD-10-CM | POA: Diagnosis not present

## 2023-06-16 DIAGNOSIS — Z0000 Encounter for general adult medical examination without abnormal findings: Secondary | ICD-10-CM | POA: Diagnosis not present

## 2023-06-16 DIAGNOSIS — N1831 Chronic kidney disease, stage 3a: Secondary | ICD-10-CM | POA: Diagnosis not present

## 2023-06-16 DIAGNOSIS — Z23 Encounter for immunization: Secondary | ICD-10-CM | POA: Diagnosis not present

## 2023-06-16 DIAGNOSIS — F02B0 Dementia in other diseases classified elsewhere, moderate, without behavioral disturbance, psychotic disturbance, mood disturbance, and anxiety: Secondary | ICD-10-CM | POA: Diagnosis not present

## 2023-06-16 DIAGNOSIS — G300 Alzheimer's disease with early onset: Secondary | ICD-10-CM | POA: Diagnosis not present

## 2023-06-16 DIAGNOSIS — I6529 Occlusion and stenosis of unspecified carotid artery: Secondary | ICD-10-CM | POA: Diagnosis not present

## 2023-06-16 DIAGNOSIS — R82998 Other abnormal findings in urine: Secondary | ICD-10-CM | POA: Diagnosis not present

## 2023-06-16 DIAGNOSIS — K429 Umbilical hernia without obstruction or gangrene: Secondary | ICD-10-CM | POA: Diagnosis not present

## 2023-06-16 DIAGNOSIS — Z1331 Encounter for screening for depression: Secondary | ICD-10-CM | POA: Diagnosis not present

## 2023-06-16 DIAGNOSIS — I129 Hypertensive chronic kidney disease with stage 1 through stage 4 chronic kidney disease, or unspecified chronic kidney disease: Secondary | ICD-10-CM | POA: Diagnosis not present

## 2023-06-29 ENCOUNTER — Other Ambulatory Visit: Payer: Self-pay | Admitting: Internal Medicine

## 2023-06-29 DIAGNOSIS — K429 Umbilical hernia without obstruction or gangrene: Secondary | ICD-10-CM

## 2023-07-04 ENCOUNTER — Encounter: Payer: Self-pay | Admitting: Internal Medicine

## 2023-07-05 ENCOUNTER — Ambulatory Visit
Admission: RE | Admit: 2023-07-05 | Discharge: 2023-07-05 | Disposition: A | Discharge status: Home / Self Care | Payer: Medicare PPO | Source: Ambulatory Visit | Attending: Internal Medicine | Admitting: Internal Medicine

## 2023-07-05 DIAGNOSIS — K429 Umbilical hernia without obstruction or gangrene: Secondary | ICD-10-CM | POA: Diagnosis not present

## 2023-07-05 DIAGNOSIS — K4090 Unilateral inguinal hernia, without obstruction or gangrene, not specified as recurrent: Secondary | ICD-10-CM | POA: Diagnosis not present

## 2023-07-05 DIAGNOSIS — I700 Atherosclerosis of aorta: Secondary | ICD-10-CM | POA: Diagnosis not present

## 2023-07-05 MED ORDER — IOPAMIDOL (ISOVUE-300) INJECTION 61%
80.0000 mL | Freq: Once | INTRAVENOUS | Status: AC | PRN
  Administered 2023-07-05: 80 mL via INTRAVENOUS

## 2023-08-15 DIAGNOSIS — Z961 Presence of intraocular lens: Secondary | ICD-10-CM | POA: Diagnosis not present

## 2023-08-15 DIAGNOSIS — H524 Presbyopia: Secondary | ICD-10-CM | POA: Diagnosis not present

## 2023-08-15 DIAGNOSIS — H52203 Unspecified astigmatism, bilateral: Secondary | ICD-10-CM | POA: Diagnosis not present

## 2023-08-15 DIAGNOSIS — H04123 Dry eye syndrome of bilateral lacrimal glands: Secondary | ICD-10-CM | POA: Diagnosis not present

## 2023-08-15 DIAGNOSIS — H353132 Nonexudative age-related macular degeneration, bilateral, intermediate dry stage: Secondary | ICD-10-CM | POA: Diagnosis not present

## 2023-11-16 DIAGNOSIS — D074 Carcinoma in situ of penis: Secondary | ICD-10-CM | POA: Diagnosis not present

## 2024-01-13 ENCOUNTER — Other Ambulatory Visit: Payer: Self-pay

## 2024-01-13 DIAGNOSIS — I6523 Occlusion and stenosis of bilateral carotid arteries: Secondary | ICD-10-CM

## 2024-01-23 ENCOUNTER — Ambulatory Visit (HOSPITAL_COMMUNITY)
Admission: RE | Admit: 2024-01-23 | Discharge: 2024-01-23 | Disposition: A | Discharge status: Home / Self Care | Payer: Medicare PPO | Source: Ambulatory Visit | Attending: Surgery | Admitting: Surgery

## 2024-01-23 ENCOUNTER — Ambulatory Visit: Payer: Medicare PPO | Admitting: Physician Assistant

## 2024-01-23 VITALS — BP 171/87 | HR 55 | Temp 98.0°F | Resp 18 | Ht 69.0 in | Wt 194.4 lb

## 2024-01-23 DIAGNOSIS — I6523 Occlusion and stenosis of bilateral carotid arteries: Secondary | ICD-10-CM | POA: Insufficient documentation

## 2024-01-23 NOTE — Progress Notes (Signed)
HISTORY AND PHYSICAL     CC:  follow up. Requesting Provider:  Cleatis Polka., MD  HPI: This is a 83 y.o. male here for follow up for carotid artery stenosis.  Pt is s/p right CEA for asymptomatic carotid artery stenosis on 10/28/2008 by Dr. Darrick Penna.    Pt was last seen 01/21/2022 and at that time he was not having any neurological sx.  He was still doing push ups and crunches and remained active.   Pt returns today for follow up and here with his wife.  She states that his dementia has progressed and he does have some aphasia that has been present with the onset of his dementia.    Pt's wife states that he has not had any amaurosis fugax, new speech difficulties, weakness, numbness, paralysis or clumsiness or facial droop.    He does not have any complaints of cramping with walking.  He continues to do crunches and push ups.    The pt is on a statin for cholesterol management.  The pt is on a daily aspirin.   Other AC:  none The pt is on ARB for hypertension.   The pt is not on medication for diabetes Tobacco hx:  never  Pt does not have family hx of AAA.  Past Medical History:  Diagnosis Date   Arthritis    Carotid artery occlusion    Chronic kidney disease    Dementia (HCC)    Hyperlipidemia    Hypertension     Past Surgical History:  Procedure Laterality Date   APPENDECTOMY     CAROTID ENDARTERECTOMY Right 2009   CEA   COLONOSCOPY     EYE SURGERY Left Jan. 2014   Cataract and Lens implant   EYE SURGERY Right Feb. 2014   Cataract and Lens implant   KNEE SURGERY     right   PENILE BIOPSY N/A 06/08/2022   Procedure: EXCISION OF PENILE LESION;  Surgeon: Noel Christmas, MD;  Location: [facility];  Service: Urology;  Laterality: N/A;  1 HR    No Known Allergies  Current Outpatient Medications  Medication Sig Dispense Refill   acetaminophen (TYLENOL) 500 MG tablet Take 1,000 mg by mouth every 8 (eight) hours as needed for moderate pain.     aspirin EC 81 MG  tablet Take 81 mg by mouth daily.     donepezil (ARICEPT) 10 MG tablet Take 10 mg by mouth at bedtime.     Multiple Vitamins-Minerals (PRESERVISION AREDS 2) CAPS Take 1 capsule by mouth in the morning and at bedtime.     olmesartan (BENICAR) 20 MG tablet Take 20 mg by mouth daily.     Polyethyl Glyc-Propyl Glyc PF (SYSTANE ULTRA PF) 0.4-0.3 % SOLN Place 1 drop into both eyes 2 (two) times daily as needed (dry eyes).     rosuvastatin (CRESTOR) 20 MG tablet Take 20 mg by mouth daily.     sildenafil (VIAGRA) 100 MG tablet Take 100 mg by mouth daily as needed for erectile dysfunction.     No current facility-administered medications for this visit.    Family History  Problem Relation Age of Onset   Diabetes Maternal Grandmother    Cancer Mother        Lung   Cancer Father        Abdominal    Colon cancer Neg Hx     Social History   Socioeconomic History   Marital status: Married    Spouse name:  Not on file   Number of children: Not on file   Years of education: Not on file   Highest education level: Not on file  Occupational History   Not on file  Tobacco Use   Smoking status: Never   Smokeless tobacco: Never  Vaping Use   Vaping status: Never Used  Substance and Sexual Activity   Alcohol use: No    Alcohol/week: 0.0 standard drinks of alcohol   Drug use: No   Sexual activity: Not on file  Other Topics Concern   Not on file  Social History Narrative   Right Handed for writing. Left handed for sports.   Lives in a one story    Social Drivers of Corporate investment banker Strain: Not on file  Food Insecurity: Not on file  Transportation Needs: Not on file  Physical Activity: Not on file  Stress: Not on file  Social Connections: Not on file  Intimate Partner Violence: Not on file     REVIEW OF SYSTEMS:   [X]  denotes positive finding, [ ]  denotes negative finding Cardiac  Comments:  Chest pain or chest pressure:    Shortness of breath upon exertion:    Short  of breath when lying flat:    Irregular heart rhythm:        Vascular    Pain in calf, thigh, or hip brought on by ambulation:    Pain in feet at night that wakes you up from your sleep:     Blood clot in your veins:    Leg swelling:         Pulmonary    Oxygen at home:    Productive cough:     Wheezing:         Neurologic    Sudden weakness in arms or legs:     Sudden numbness in arms or legs:     Sudden onset of difficulty speaking or slurred speech:    Temporary loss of vision in one eye:     Problems with dizziness:         Gastrointestinal    Blood in stool:     Vomited blood:         Genitourinary    Burning when urinating:     Blood in urine:        Psychiatric    Major depression:         Hematologic    Bleeding problems:    Problems with blood clotting too easily:        Skin    Rashes or ulcers:        Constitutional    Fever or chills:      PHYSICAL EXAMINATION:  Today's Vitals   01/23/24 1020 01/23/24 1023  BP: (!) 157/82 (!) 171/87  Pulse: (!) 55   Resp: 18   Temp: 98 F (36.7 C)   TempSrc: Temporal   SpO2: 100%   Weight: 194 lb 6.4 oz (88.2 kg)   Height: 5\' 9"  (1.753 m)    Body mass index is 28.71 kg/m.   General:  WDWN in NAD; vital signs documented above Gait: Not observed HENT: WNL, normocephalic Pulmonary: normal non-labored breathing Cardiac: regular HR, without carotid bruits Abdomen: soft, NT; aortic pulse is not palpable Skin: without rashes Vascular Exam/Pulses:  Right Left  Radial 2+ (normal) 2+ (normal)   Extremities: without open wounds Musculoskeletal: no muscle wasting or atrophy  Neurologic: A&O X 3; moving all extremities equally; speech  is fluent/normal Psychiatric:  The pt has Normal affect.   Non-Invasive Vascular Imaging:   Carotid Duplex on 01/23/2024 Right:  1-39% ICA stenosis Left:  1-39% ICA stenosis Vertebrals:  Bilateral vertebral arteries demonstrate antegrade flow.  Subclavians: Normal flow  hemodynamics were seen in bilateral subclavian arteries.   Previous Carotid duplex on 01/21/2022: Right: 1-39% ICA stenosis Left:   1-39% ICA stenosis    ASSESSMENT/PLAN:: 83 y.o. male here for follow up carotid artery stenosis and is s/p  right CEA for asymptomatic carotid artery stenosis on 10/28/2008 by Dr. Darrick Penna.    -duplex today reveals bilateral ICA stenosis remains 1-39% and he has been asymptomatic.  He has some aphasia that has come on with his progression of dementia. -discussed s/s of stroke with pt and wife and they understand should he develop any of these sx, he will go to the nearest ER or call 911. -after discussions with his wife, pt will f/u in 3 years with carotid duplex since this has been unchanged back to at least 2017.  Pt wife is in agreement with this.  She knows to call sooner if she has any concerns before then.  -pt will call sooner should he have any issues. -continue statin/asa    Doreatha Massed, [facility] [phone]  Clinic MD:  Myra Gianotti

## 2024-04-03 DIAGNOSIS — K429 Umbilical hernia without obstruction or gangrene: Secondary | ICD-10-CM | POA: Diagnosis not present

## 2024-04-03 DIAGNOSIS — R1030 Lower abdominal pain, unspecified: Secondary | ICD-10-CM | POA: Diagnosis not present

## 2024-04-03 DIAGNOSIS — R630 Anorexia: Secondary | ICD-10-CM | POA: Diagnosis not present

## 2024-04-20 ENCOUNTER — Ambulatory Visit: Payer: Self-pay | Admitting: Surgery

## 2024-04-20 DIAGNOSIS — K429 Umbilical hernia without obstruction or gangrene: Secondary | ICD-10-CM | POA: Diagnosis not present

## 2024-04-20 DIAGNOSIS — K4090 Unilateral inguinal hernia, without obstruction or gangrene, not specified as recurrent: Secondary | ICD-10-CM | POA: Diagnosis not present

## 2024-04-20 NOTE — H&P (Signed)
 Subjective    Chief Complaint: New Consultation (umbilical hernia)       History of Present Illness: Blake Atkins is a 83 y.o. male who is seen today as an office consultation at the request of Dr. Erma Hay for evaluation of New Consultation (umbilical hernia) .   This is an 83 year old male with a history of carotid endarterectomy in 2009.  The patient has progressive dementia and some aphasia but is otherwise quite mobile.  He is accompanied by his wife.  The patient previously played golf almost every day.  However, recently his umbilical hernia has become more tender and he is no longer playing golf.  He denies any symptoms in his left groin.  The patient had a CT scan performed in July 2024 that showed a small fat-containing umbilical hernia and a small fat-containing left inguinal hernia.  Due to the worsening symptoms and the slight enlargement, the patient and his wife present now to discuss surgical repair.  The patient normally takes a baby aspirin on a daily basis as a blood thinner.   Review of Systems: A complete review of systems was obtained from the patient.  I have reviewed this information and discussed as appropriate with the patient.  See HPI as well for other ROS.   Review of Systems  Constitutional: Negative.   HENT: Negative.    Eyes:  Positive for redness.  Respiratory: Negative.    Cardiovascular: Negative.   Gastrointestinal: Negative.   Genitourinary: Negative.   Musculoskeletal: Negative.   Skin: Negative.   Neurological: Negative.   Endo/Heme/Allergies: Negative.   Psychiatric/Behavioral:  Positive for memory loss. The patient is nervous/anxious.         Medical History: Past Medical History      Past Medical History:  Diagnosis Date   Arthritis     Hypertension          Problem List     Patient Active Problem List  Diagnosis   Umbilical hernia without obstruction or gangrene   Left inguinal hernia        Past Surgical History        Past Surgical History:  Procedure Laterality Date   CATARACT EXTRACTION       JOINT REPLACEMENT            Allergies  No Known Allergies     Medications Ordered Prior to Encounter        Current Outpatient Medications on File Prior to Visit  Medication Sig Dispense Refill   acetaminophen  (TYLENOL ) 500 MG tablet Take 1,000 mg by mouth       aspirin 81 MG EC tablet Take 81 mg by mouth once daily       donepeziL (ARICEPT) 10 MG tablet TAKE 1 TABLET BY MOUTH ONCE DAILY AT BEDTIME for 90 days       olmesartan (BENICAR) 20 MG tablet Take 1 tablet by mouth once daily       peg 400-propylene glycol, PF, (SYSTANE ULTRA) 0.4-0.3 % ophthalmic drops Place 1 drop into both eyes as needed for Dry Eyes       rosuvastatin (CRESTOR) 20 MG tablet TAKE 1 TABLET BY MOUTH DAILY for 90       sildenafiL (VIAGRA) 100 MG tablet TAKE 1/2 TO 1 TABLET BY MOUTH ONCE DAILY AS NEEDED for 30       vit A/vit C/vit E/zinc /copper (OCUVITE PRESERVISION ORAL) Take by mouth        No current facility-administered medications on file prior  to visit.        Family History  History reviewed. No pertinent family history.      Tobacco Use History  Social History       Tobacco Use  Smoking Status Never  Smokeless Tobacco Never        Social History  Social History        Socioeconomic History   Marital status: Married  Tobacco Use   Smoking status: Never   Smokeless tobacco: Never  Vaping Use   Vaping status: Never Used  Substance and Sexual Activity   Alcohol  use: Not Currently   Drug use: Never    Social Drivers of Health        Housing Stability: Unknown (04/20/2024)    Housing Stability Vital Sign     Homeless in the Last Year: No        Objective:          Vitals:    04/20/24 1119 04/20/24 1120  BP: (!) 143/87    Pulse: 72    Temp: 36.8 C (98.3 F)    SpO2: 99%    Weight: 86.5 kg (190 lb 9.6 oz)    Height: 175.3 cm (5\' 9" )    PainSc:     7  PainLoc:   Umbilicus    Body  mass index is 28.15 kg/m.   Physical Exam    Constitutional:  WDWN in NAD, conversant, no obvious deformities; lying in bed comfortably Eyes:  Pupils equal, round; sclera anicteric; moist conjunctiva; no lid lag HENT:  Oral mucosa moist; good dentition  Neck:  No masses palpated, trachea midline; no thyromegaly Lungs:  CTA bilaterally; normal respiratory effort CV:  Regular rate and rhythm; no murmurs; extremities well-perfused with no edema Abd:  +bowel sounds, soft, non-tender, no palpable organomegaly; small protruding umbilical hernia that is not reducible.  The defect is likely less than 1 cm.  The hernia sac is only 1.5 cm. Musc:  Slow shuffling gait; no apparent clubbing or cyanosis in extremities GU: Bilateral descended testes, no testicular masses, scars from previous penile surgery.  No sign of right inguinal hernia.  Small reducible left inguinal hernia Lymphatic:  No palpable cervical or axillary lymphadenopathy Skin:  Warm, dry; no sign of jaundice Psychiatric - alert and oriented x 4; calm mood and affect     Labs, Imaging and Diagnostic Testing: CLINICAL DATA:  Umbilical hernia.   EXAM: CT ABDOMEN AND PELVIS WITH CONTRAST   TECHNIQUE: Multidetector CT imaging of the abdomen and pelvis was performed using the standard protocol following bolus administration of intravenous contrast.   RADIATION DOSE REDUCTION: This exam was performed according to the departmental dose-optimization program which includes automated exposure control, adjustment of the mA and/or kV according to patient size and/or use of iterative reconstruction technique.   CONTRAST:  80mL ISOVUE -300 IOPAMIDOL  (ISOVUE -300) INJECTION 61%   COMPARISON:  None Available.   FINDINGS: Lower Chest: No acute findings.   Hepatobiliary: No suspicious hepatic masses identified. Gallbladder is unremarkable. No evidence of biliary ductal dilatation.   Pancreas:  No mass or inflammatory changes.   Spleen:  Within normal limits in size and appearance.   Adrenals/Urinary Tract: No suspicious masses identified. No evidence of ureteral calculi or hydronephrosis.   Stomach/Bowel: No evidence of obstruction, inflammatory process or abnormal fluid collections.   Vascular/Lymphatic: No pathologically enlarged lymph nodes. No acute vascular findings. Aortic atherosclerotic calcification incidentally noted.   Reproductive:  No mass or other significant abnormality.  Other: Small left inguinal hernia, which contains only fat. Tiny umbilical hernia also seen, which contains only fat.   Musculoskeletal: No suspicious bone lesions identified. Bilateral L5 pars defects incidentally noted, without associated spondylolisthesis.   IMPRESSION: Tiny umbilical hernia, which contains only fat.   Small left inguinal hernia, which contains only fat.   No acute findings.   Aortic Atherosclerosis (ICD10-I70.0).     Electronically Signed   By: Marlyce Sine M.D.   On: 07/05/2023 18:05   Assessment and Plan:  Diagnoses and all orders for this visit:   Umbilical hernia without obstruction or gangrene   Left inguinal hernia     Despite his dementia and aphasia, the patient remains fairly active.  His hernias have become more symptomatic, especially the umbilical hernia.  Recommend umbilical hernia repair and left inguinal hernia repair with mesh.  The umbilical hernia defect is likely less than a centimeter and should not require mesh.The surgical procedure has been discussed with the patient.  Potential risks, benefits, alternative treatments, and expected outcomes have been explained.  All of the patient's questions at this time have been answered.  The likelihood of reaching the patient's treatment goal is good.  The patient understands the proposed surgical procedure and wishes to proceed.   Hold aspirin 5 days before surgery     Layson Bertsch KAI Reve Crocket, MD  04/20/2024 11:59 AM

## 2024-05-11 NOTE — Pre-Procedure Instructions (Signed)
 Surgical Instructions   Your procedure is scheduled on Thursday, May 29th. Report to [facility] Main Entrance "A" at 09:30 A.M., then check in with the Admitting office. Any questions or running late day of surgery: call [phone]  Questions prior to your surgery date: call [phone], Monday-Friday, 8am-4pm. If you experience any cold or flu symptoms such as cough, fever, chills, shortness of breath, etc. between now and your scheduled surgery, please notify us  at the above number.     Remember:  Do not eat after midnight the night before your surgery   You may drink clear liquids until 08:30 AM the morning of your surgery.   Clear liquids allowed are: Water, Non-Citrus Juices (without pulp), Carbonated Beverages, Clear Tea (no milk, honey, etc.), Black Coffee Only (NO MILK, CREAM OR POWDERED CREAMER of any kind), and Gatorade.    Take these medicines the morning of surgery with A SIP OF WATER  rosuvastatin (CRESTOR)    May take these medicines IF NEEDED: acetaminophen (TYLENOL)  Polyethyl Glyc-Propyl Glyc PF (SYSTANE ULTRA PF)    Follow your surgeon's instructions on when to stop Aspirin.  If no instructions were given by your surgeon then you will need to call the office to get those instructions.    One week prior to surgery, STOP taking any Aleve, Naproxen, Ibuprofen, Motrin, Advil, Goody's, BC's, all herbal medications, fish oil, and non-prescription vitamins.                     Do NOT Smoke (Tobacco/Vaping) for 24 hours prior to your procedure.  If you use a CPAP at night, you may bring your mask/headgear for your overnight stay.   You will be asked to remove any contacts, glasses, piercing's, hearing aid's, dentures/partials prior to surgery. Please bring cases for these items if needed.    Patients discharged the day of surgery will not be allowed to drive home, and someone needs to stay with them for 24 hours.  SURGICAL WAITING ROOM VISITATION Patients may  have no more than 2 support people in the waiting area - these visitors may rotate.   Pre-op nurse will coordinate an appropriate time for 1 ADULT support person, who may not rotate, to accompany patient in pre-op.  Children under the age of 53 must have an adult with them who is not the patient and must remain in the main waiting area with an adult.  If the patient needs to stay at the hospital during part of their recovery, the visitor guidelines for inpatient rooms apply.  Please refer to the [facility] website for the visitor guidelines for any additional information.   If you received a COVID test during your pre-op visit  it is requested that you wear a mask when out in public, stay away from anyone that may not be feeling well and notify your surgeon if you develop symptoms. If you have been in contact with anyone that has tested positive in the last 10 days please notify you surgeon.      Pre-operative CHG Bathing Instructions   You can play a key role in reducing the risk of infection after surgery. Your skin needs to be as free of germs as possible. You can reduce the number of germs on your skin by washing with CHG (chlorhexidine  gluconate) soap before surgery. CHG is an antiseptic soap that kills germs and continues to kill germs even after washing.   DO NOT use if you have an allergy to  chlorhexidine /CHG or antibacterial soaps. If your skin becomes reddened or irritated, stop using the CHG and notify one of our RNs at [phone].              TAKE A SHOWER THE NIGHT BEFORE SURGERY AND THE DAY OF SURGERY    Please keep in mind the following:  DO NOT shave, including legs and underarms, 48 hours prior to surgery.   You may shave your face before/day of surgery.  Place clean sheets on your bed the night before surgery Use a clean washcloth (not used since being washed) for each shower. DO NOT sleep with pet's night before surgery.  CHG Shower Instructions:  Wash your face  and private area with normal soap. If you choose to wash your hair, wash first with your normal shampoo.  After you use shampoo/soap, rinse your hair and body thoroughly to remove shampoo/soap residue.  Turn the water OFF and apply half the bottle of CHG soap to a CLEAN washcloth.  Apply CHG soap ONLY FROM YOUR NECK DOWN TO YOUR TOES (washing for 3-5 minutes)  DO NOT use CHG soap on face, private areas, open wounds, or sores.  Pay special attention to the area where your surgery is being performed.  If you are having back surgery, having someone wash your back for you may be helpful. Wait 2 minutes after CHG soap is applied, then you may rinse off the CHG soap.  Pat dry with a clean towel  Put on clean pajamas    Additional instructions for the day of surgery: DO NOT APPLY any lotions, deodorants, cologne, or perfumes.   Do not wear jewelry or makeup Do not wear nail polish, gel polish, artificial nails, or any other type of covering on natural nails (fingers and toes) Do not bring valuables to the hospital. [facility] is not responsible for valuables/personal belongings. Put on clean/comfortable clothes.  Please brush your teeth.  Ask your nurse before applying any prescription medications to the skin.

## 2024-05-14 ENCOUNTER — Encounter (HOSPITAL_COMMUNITY): Payer: Self-pay

## 2024-05-14 ENCOUNTER — Encounter (HOSPITAL_COMMUNITY)
Admission: RE | Admit: 2024-05-14 | Discharge: 2024-05-14 | Disposition: A | Discharge status: Home / Self Care | Source: Ambulatory Visit | Attending: Surgery | Admitting: Surgery

## 2024-05-14 ENCOUNTER — Other Ambulatory Visit: Payer: Self-pay

## 2024-05-14 VITALS — BP 130/78 | HR 57 | Temp 97.9°F | Resp 18 | Ht 69.0 in | Wt 191.4 lb

## 2024-05-14 DIAGNOSIS — R001 Bradycardia, unspecified: Secondary | ICD-10-CM | POA: Insufficient documentation

## 2024-05-14 DIAGNOSIS — Z01818 Encounter for other preprocedural examination: Secondary | ICD-10-CM | POA: Insufficient documentation

## 2024-05-14 DIAGNOSIS — I129 Hypertensive chronic kidney disease with stage 1 through stage 4 chronic kidney disease, or unspecified chronic kidney disease: Secondary | ICD-10-CM | POA: Insufficient documentation

## 2024-05-14 DIAGNOSIS — F0390 Unspecified dementia without behavioral disturbance: Secondary | ICD-10-CM | POA: Diagnosis not present

## 2024-05-14 DIAGNOSIS — K4090 Unilateral inguinal hernia, without obstruction or gangrene, not specified as recurrent: Secondary | ICD-10-CM | POA: Diagnosis not present

## 2024-05-14 DIAGNOSIS — Z8679 Personal history of other diseases of the circulatory system: Secondary | ICD-10-CM | POA: Insufficient documentation

## 2024-05-14 DIAGNOSIS — R4701 Aphasia: Secondary | ICD-10-CM | POA: Diagnosis not present

## 2024-05-14 DIAGNOSIS — K429 Umbilical hernia without obstruction or gangrene: Secondary | ICD-10-CM | POA: Diagnosis not present

## 2024-05-14 DIAGNOSIS — I491 Atrial premature depolarization: Secondary | ICD-10-CM | POA: Diagnosis not present

## 2024-05-14 DIAGNOSIS — E785 Hyperlipidemia, unspecified: Secondary | ICD-10-CM | POA: Diagnosis not present

## 2024-05-14 DIAGNOSIS — I10 Essential (primary) hypertension: Secondary | ICD-10-CM

## 2024-05-14 DIAGNOSIS — Z87438 Personal history of other diseases of male genital organs: Secondary | ICD-10-CM | POA: Diagnosis not present

## 2024-05-14 DIAGNOSIS — I440 Atrioventricular block, first degree: Secondary | ICD-10-CM | POA: Insufficient documentation

## 2024-05-14 DIAGNOSIS — Z7982 Long term (current) use of aspirin: Secondary | ICD-10-CM | POA: Insufficient documentation

## 2024-05-14 DIAGNOSIS — N183 Chronic kidney disease, stage 3 unspecified: Secondary | ICD-10-CM | POA: Insufficient documentation

## 2024-05-14 HISTORY — DX: Carcinoma in situ of penis: D07.4

## 2024-05-14 LAB — BASIC METABOLIC PANEL WITH GFR
Anion gap: 7 (ref 5–15)
BUN: 17 mg/dL (ref 8–23)
CO2: 27 mmol/L (ref 22–32)
Calcium: 9.1 mg/dL (ref 8.9–10.3)
Chloride: 104 mmol/L (ref 98–111)
Creatinine, Ser: 1.34 mg/dL — ABNORMAL HIGH (ref 0.61–1.24)
GFR, Estimated: 53 mL/min — ABNORMAL LOW (ref >60)
Glucose, Bld: 92 mg/dL (ref 70–99)
Potassium: 4.6 mmol/L (ref 3.5–5.1)
Sodium: 138 mmol/L (ref 135–145)

## 2024-05-14 LAB — CBC
HCT: 45.7 % (ref 39.0–52.0)
Hemoglobin: 15 g/dL (ref 13.0–17.0)
MCH: 33.2 pg (ref 26.0–34.0)
MCHC: 32.8 g/dL (ref 30.0–36.0)
MCV: 101.1 fL — ABNORMAL HIGH (ref 80.0–100.0)
Platelets: 244 10*3/uL (ref 150–400)
RBC: 4.52 MIL/uL (ref 4.22–5.81)
RDW: 12.2 % (ref 11.5–15.5)
WBC: 6.2 10*3/uL (ref 4.0–10.5)
nRBC: 0 % (ref 0.0–0.2)

## 2024-05-14 NOTE — Progress Notes (Signed)
 PCP - Dr. Kristina Pfeiffer.  Cardiologist - denies  PPM/ICD - denies   Chest x-ray - 10/25/2008 EKG - 05/14/24 Stress Test - denies ECHO - denies Cardiac Cath - denies  Sleep Study - denies   DM- denies  Last dose of GLP1 agonist-  n/a   Blood Thinner Instructions: n/a Aspirin Instructions: Hold 5 days. Last dose 5/23  ERAS Protcol - clears until 0830   COVID TEST- n/a   Anesthesia review: yes, abnormal EKG  Patient denies shortness of breath, fever, cough and chest pain at PAT appointment   All instructions explained to the patient, with a verbal understanding of the material. Patient agrees to go over the instructions while at home for a better understanding. The opportunity to ask questions was provided.

## 2024-05-15 NOTE — Anesthesia Preprocedure Evaluation (Signed)
 Anesthesia Evaluation  Patient identified by MRN, date of birth, ID band Patient awake    Reviewed: Allergy & Precautions, NPO status , Patient's Chart, lab work & pertinent test results, reviewed documented beta blocker date and time   Airway Mallampati: II  TM Distance: >3 FB     Dental no notable dental hx.    Pulmonary neg pulmonary ROS   Pulmonary exam normal breath sounds clear to auscultation       Cardiovascular hypertension, Pt. on medications + Peripheral Vascular Disease  Normal cardiovascular exam Rhythm:Regular Rate:Normal  EKG 05/14/24 Sinus bradycardia with 1st degree A-V block with Premature supraventricular complexes Minimal voltage criteria for LVH, may be normal variant ( R in aVL ) Septal infarct , age undetermined  Bilateral Carotid artery disease (right CEA 10/28/08; 1-39% BICA 01/23/24),    Neuro/Psych  PSYCHIATRIC DISORDERS     Dementia negative neurological ROS     GI/Hepatic negative GI ROS, Neg liver ROS,,,  Endo/Other  HLD  Renal/GU Renal InsufficiencyRenal disease  negative genitourinary   Musculoskeletal  (+) Arthritis , Osteoarthritis,  Umbilical hernia Left inguinal hernia   Abdominal   Peds  Hematology negative hematology ROS (+)   Anesthesia Other Findings   Reproductive/Obstetrics ED                              Anesthesia Physical Anesthesia Plan  ASA: 3  Anesthesia Plan: General   Post-op Pain Management: Regional block* and Minimal or no pain anticipated   Induction: Intravenous  PONV Risk Score and Plan: 4 or greater and Treatment may vary due to age or medical condition, Ondansetron  and Dexamethasone  Airway Management Planned: Oral ETT  Additional Equipment: None  Intra-op Plan:   Post-operative Plan: Extubation in OR  Informed Consent: I have reviewed the patients History and Physical, chart, labs and discussed the procedure  including the risks, benefits and alternatives for the proposed anesthesia with the patient or authorized representative who has indicated his/her understanding and acceptance.     Dental advisory given  Plan Discussed with: CRNA and Anesthesiologist  Anesthesia Plan Comments: (PAT note written 05/15/2024 by Allison Zelenak, PA-C.  )        Anesthesia Quick Evaluation

## 2024-05-15 NOTE — Progress Notes (Signed)
 Anesthesia Chart Review:  Case: [id number] Date/Time: 05/24/24 1115   Procedures:      REPAIR, HERNIA, UMBILICAL, ADULT - LMA TAP BLOCK     REPAIR, HERNIA, INGUINAL, ADULT (Left) - LMA TAP BLOCK   Anesthesia type: General   Diagnosis:      Umbilical hernia without obstruction or gangrene [K42.9]     Inguinal hernia without obstruction or gangrene, recurrence not specified, unspecified laterality [K40.90]   Pre-op diagnosis: UMBILICAL HERNIA, LEFT INGUINAL HERNIA   Location: [facility]OM 09 / [facility]   Surgeons: Dareen Ebbing, MD       DISCUSSION: Patient is an 83 year old male scheduled for the above procedure.  History includes never smoker, HTN, CKD (stage 3), dementia (was some aphasia), HLD, carotid artery disease (right CEA 10/28/08; 1-39% BICA 01/23/24),  Bowen's disease of penis (s/p excision of carcinoma in situ 06/08/22).   Per notes, despite his dementia and aphasia, he remains fairly active. His umbilical and left inguinal hernias have become more symptomatic.   Last aspirin dose planned for 05/18/2024.  Anesthesia team to evaluate on the day of surgery. Preoperative labs and EKG appear stable. Wife to accompany him to [facility] given his dementia.    VS: BP 130/78   Pulse (!) 57   Temp 36.6 C   Resp 18   Ht 5\' 9"  (1.753 m)   Wt 86.8 kg   SpO2 98%   BMI 28.26 kg/m   PROVIDERS: Jeannine Milroy., MD Carotid artery disease is followed at [facility]  Perley Bradley, MD is urologist Evaluation by neurologist Tat, Ivette Marks, DO on 05/12/21 for essential tremor, dementia, and REM behavior disorder. She did not think he showed evidence of Parkinson's disease, so as needed follow-up in the [facility] recommended.    LABS: Labs reviewed: Acceptable for surgery. Creatinine 1.34, previously 1.3 on 06/10/23, 1.64 on 05/31/22.  (all labs ordered are listed, but only abnormal results are displayed)  Labs Reviewed  BASIC METABOLIC PANEL WITH GFR -  Abnormal; Notable for the following components:      Result Value   Creatinine, Ser 1.34 (*)    GFR, Estimated 53 (*)    All other components within normal limits  CBC - Abnormal; Notable for the following components:   MCV 101.1 (*)    All other components within normal limits     IMAGES: CT Abd/pelvis 07/05/23: IMPRESSION: Tiny umbilical hernia, which contains only fat. Small left inguinal hernia, which contains only fat. No acute findings. Aortic Atherosclerosis (ICD10-I70.0).   EKG: 05/14/24: Sinus bradycardia at 51 bpm with 1st degree A-V block with Premature supraventricular complexes Minimal voltage criteria for LVH, may be normal variant ( R in aVL ) Septal infarct , age undetermined Abnormal ECG When compared with ECG of 31-May-2022 09:31, No significant change was found Confirmed by Carson Clara [phone]) on 05/14/2024 4:45:38 PM   CV:  US  Carotid 01/23/24: Summary:  - Right Carotid: Velocities in the right ICA are consistent with a 1-39% stenosis.  - Left Carotid: Velocities in the left ICA are consistent with a 1-39%  stenosis.  - Vertebrals: Bilateral vertebral arteries demonstrate antegrade flow.  - Subclavians: Normal flow hemodynamics were seen in bilateral subclavian arteries.    Past Medical History:  Diagnosis Date   Arthritis    Bowen's disease of penis    x2   Carotid artery occlusion    Chronic kidney disease    Dementia (HCC)    Hyperlipidemia  Hypertension     Past Surgical History:  Procedure Laterality Date   APPENDECTOMY     CAROTID ENDARTERECTOMY Right 2009   CEA   COLONOSCOPY     EYE SURGERY Left Jan. 2014   Cataract and Lens implant   EYE SURGERY Right Feb. 2014   Cataract and Lens implant   KNEE SURGERY     right   PENILE BIOPSY N/A 06/08/2022   Procedure: EXCISION OF PENILE LESION;  Surgeon: Roxane Copp, MD;  Location: [facility];  Service: Urology;  Laterality: N/A;  1 HR    MEDICATIONS:  acetaminophen  (TYLENOL) 500 MG tablet   aspirin EC 81 MG tablet   donepezil (ARICEPT) 10 MG tablet   Multiple Vitamins-Minerals (PRESERVISION AREDS 2) CAPS   olmesartan (BENICAR) 20 MG tablet   Polyethyl Glyc-Propyl Glyc PF (SYSTANE ULTRA PF) 0.4-0.3 % SOLN   rosuvastatin (CRESTOR) 20 MG tablet   sildenafil (VIAGRA) 100 MG tablet   No current facility-administered medications for this encounter.    Ella Gun, PA-C Surgical Short Stay/Anesthesiology [facility] Phone [phone] [facility] Phone [phone] 05/15/2024 4:26 PM

## 2024-05-24 ENCOUNTER — Encounter (HOSPITAL_COMMUNITY): Payer: Self-pay | Admitting: Surgery

## 2024-05-24 ENCOUNTER — Ambulatory Visit (HOSPITAL_COMMUNITY)
Admission: RE | Admit: 2024-05-24 | Discharge: 2024-05-24 | Disposition: A | Discharge status: Home / Self Care | Attending: Surgery | Admitting: Surgery

## 2024-05-24 ENCOUNTER — Encounter (HOSPITAL_COMMUNITY)
Admission: RE | Disposition: A | Discharge status: Home / Self Care | Payer: Self-pay | Source: Home / Self Care | Attending: Surgery

## 2024-05-24 ENCOUNTER — Other Ambulatory Visit (HOSPITAL_COMMUNITY): Payer: Self-pay

## 2024-05-24 ENCOUNTER — Other Ambulatory Visit: Payer: Self-pay

## 2024-05-24 ENCOUNTER — Ambulatory Visit (HOSPITAL_COMMUNITY): Admitting: Anesthesiology

## 2024-05-24 ENCOUNTER — Ambulatory Visit (HOSPITAL_COMMUNITY): Payer: Self-pay | Admitting: Vascular Surgery

## 2024-05-24 DIAGNOSIS — Z7982 Long term (current) use of aspirin: Secondary | ICD-10-CM | POA: Diagnosis not present

## 2024-05-24 DIAGNOSIS — I700 Atherosclerosis of aorta: Secondary | ICD-10-CM | POA: Diagnosis not present

## 2024-05-24 DIAGNOSIS — N189 Chronic kidney disease, unspecified: Secondary | ICD-10-CM | POA: Diagnosis not present

## 2024-05-24 DIAGNOSIS — K4090 Unilateral inguinal hernia, without obstruction or gangrene, not specified as recurrent: Secondary | ICD-10-CM

## 2024-05-24 DIAGNOSIS — E785 Hyperlipidemia, unspecified: Secondary | ICD-10-CM | POA: Diagnosis not present

## 2024-05-24 DIAGNOSIS — Z79899 Other long term (current) drug therapy: Secondary | ICD-10-CM | POA: Insufficient documentation

## 2024-05-24 DIAGNOSIS — I10 Essential (primary) hypertension: Secondary | ICD-10-CM | POA: Diagnosis not present

## 2024-05-24 DIAGNOSIS — R001 Bradycardia, unspecified: Secondary | ICD-10-CM | POA: Diagnosis not present

## 2024-05-24 DIAGNOSIS — K429 Umbilical hernia without obstruction or gangrene: Secondary | ICD-10-CM | POA: Diagnosis not present

## 2024-05-24 DIAGNOSIS — G8918 Other acute postprocedural pain: Secondary | ICD-10-CM | POA: Diagnosis not present

## 2024-05-24 DIAGNOSIS — I129 Hypertensive chronic kidney disease with stage 1 through stage 4 chronic kidney disease, or unspecified chronic kidney disease: Secondary | ICD-10-CM | POA: Diagnosis not present

## 2024-05-24 DIAGNOSIS — I739 Peripheral vascular disease, unspecified: Secondary | ICD-10-CM

## 2024-05-24 DIAGNOSIS — R4701 Aphasia: Secondary | ICD-10-CM | POA: Diagnosis not present

## 2024-05-24 DIAGNOSIS — M1990 Unspecified osteoarthritis, unspecified site: Secondary | ICD-10-CM | POA: Insufficient documentation

## 2024-05-24 DIAGNOSIS — I440 Atrioventricular block, first degree: Secondary | ICD-10-CM | POA: Insufficient documentation

## 2024-05-24 DIAGNOSIS — F0390 Unspecified dementia without behavioral disturbance: Secondary | ICD-10-CM | POA: Diagnosis not present

## 2024-05-24 HISTORY — PX: INGUINAL HERNIA REPAIR: SHX194

## 2024-05-24 HISTORY — PX: UMBILICAL HERNIA REPAIR: SHX196

## 2024-05-24 SURGERY — REPAIR, HERNIA, UMBILICAL, ADULT
Anesthesia: General

## 2024-05-24 MED ORDER — BUPIVACAINE-EPINEPHRINE 0.25% -1:200000 IJ SOLN
INTRAMUSCULAR | Status: DC | PRN
  Administered 2024-05-24: 10 mL

## 2024-05-24 MED ORDER — PROPOFOL 10 MG/ML IV BOLUS
INTRAVENOUS | Status: DC | PRN
  Administered 2024-05-24: 100 mg via INTRAVENOUS

## 2024-05-24 MED ORDER — ACETAMINOPHEN 500 MG PO TABS
ORAL_TABLET | ORAL | Status: AC
  Administered 2024-05-24: 1000 mg via ORAL
  Filled 2024-05-24: qty 2

## 2024-05-24 MED ORDER — EPHEDRINE 5 MG/ML INJ
INTRAVENOUS | Status: AC
  Filled 2024-05-24: qty 5

## 2024-05-24 MED ORDER — OXYCODONE HCL 5 MG PO TABS: 5.0000 mg | ORAL_TABLET | Freq: Once | ORAL | Status: DC | PRN

## 2024-05-24 MED ORDER — FENTANYL CITRATE (PF) 250 MCG/5ML IJ SOLN
INTRAMUSCULAR | Status: DC | PRN
  Administered 2024-05-24: 100 ug via INTRAVENOUS

## 2024-05-24 MED ORDER — LIDOCAINE 2% (20 MG/ML) 5 ML SYRINGE
INTRAMUSCULAR | Status: AC
  Filled 2024-05-24: qty 5

## 2024-05-24 MED ORDER — PROPOFOL 500 MG/50ML IV EMUL
INTRAVENOUS | Status: DC | PRN
  Administered 2024-05-24: 125 ug/kg/min via INTRAVENOUS

## 2024-05-24 MED ORDER — FENTANYL CITRATE (PF) 100 MCG/2ML IJ SOLN: 25.0000 ug | INTRAMUSCULAR | Status: DC | PRN

## 2024-05-24 MED ORDER — ONDANSETRON HCL 4 MG/2ML IJ SOLN: 4.0000 mg | Freq: Once | INTRAMUSCULAR | Status: DC | PRN

## 2024-05-24 MED ORDER — LIDOCAINE 2% (20 MG/ML) 5 ML SYRINGE
INTRAMUSCULAR | Status: DC | PRN
  Administered 2024-05-24: 60 mg via INTRAVENOUS

## 2024-05-24 MED ORDER — ONDANSETRON HCL 4 MG/2ML IJ SOLN
INTRAMUSCULAR | Status: AC
  Filled 2024-05-24: qty 2

## 2024-05-24 MED ORDER — PROPOFOL 10 MG/ML IV BOLUS
INTRAVENOUS | Status: AC
  Filled 2024-05-24: qty 20

## 2024-05-24 MED ORDER — ACETAMINOPHEN 500 MG PO TABS: 1000.0000 mg | ORAL_TABLET | ORAL | Status: AC

## 2024-05-24 MED ORDER — OXYCODONE HCL 5 MG PO TABS
5.0000 mg | ORAL_TABLET | Freq: Four times a day (QID) | ORAL | 0 refills | Status: DC | PRN
  Filled 2024-05-24: qty 15, 4d supply, fill #0

## 2024-05-24 MED ORDER — ROCURONIUM BROMIDE 100 MG/10ML IV SOLN
INTRAVENOUS | Status: DC | PRN
  Administered 2024-05-24: 60 mg via INTRAVENOUS

## 2024-05-24 MED ORDER — EPHEDRINE SULFATE-NACL 50-0.9 MG/10ML-% IV SOSY
PREFILLED_SYRINGE | INTRAVENOUS | Status: DC | PRN
  Administered 2024-05-24 (×2): 5 mg via INTRAVENOUS
  Administered 2024-05-24 (×2): 10 mg via INTRAVENOUS

## 2024-05-24 MED ORDER — DEXAMETHASONE SODIUM PHOSPHATE 10 MG/ML IJ SOLN
INTRAMUSCULAR | Status: DC | PRN
  Administered 2024-05-24: 5 mg via INTRAVENOUS

## 2024-05-24 MED ORDER — CEFAZOLIN SODIUM-DEXTROSE 2-4 GM/100ML-% IV SOLN
INTRAVENOUS | Status: AC
  Filled 2024-05-24: qty 100

## 2024-05-24 MED ORDER — BUPIVACAINE HCL (PF) 0.5 % IJ SOLN
INTRAMUSCULAR | Status: DC | PRN
  Administered 2024-05-24: 20 mL via PERINEURAL

## 2024-05-24 MED ORDER — OXYCODONE HCL 5 MG/5ML PO SOLN: 5.0000 mg | Freq: Once | ORAL | Status: DC | PRN

## 2024-05-24 MED ORDER — LACTATED RINGERS IV SOLN: INTRAVENOUS | Status: DC

## 2024-05-24 MED ORDER — CHLORHEXIDINE GLUCONATE CLOTH 2 % EX PADS: 6.0000 | MEDICATED_PAD | Freq: Once | CUTANEOUS | Status: DC

## 2024-05-24 MED ORDER — FENTANYL CITRATE (PF) 100 MCG/2ML IJ SOLN
INTRAMUSCULAR | Status: AC
  Filled 2024-05-24: qty 2

## 2024-05-24 MED ORDER — CHLORHEXIDINE GLUCONATE 0.12 % MT SOLN
OROMUCOSAL | Status: AC
  Administered 2024-05-24: 15 mL via OROMUCOSAL
  Filled 2024-05-24: qty 15

## 2024-05-24 MED ORDER — BUPIVACAINE-EPINEPHRINE (PF) 0.25% -1:200000 IJ SOLN
INTRAMUSCULAR | Status: AC
  Filled 2024-05-24: qty 30

## 2024-05-24 MED ORDER — BUPIVACAINE LIPOSOME 1.3 % IJ SUSP
INTRAMUSCULAR | Status: AC
  Filled 2024-05-24: qty 10

## 2024-05-24 MED ORDER — SUGAMMADEX SODIUM 200 MG/2ML IV SOLN
INTRAVENOUS | Status: DC | PRN
  Administered 2024-05-24: 200 mg via INTRAVENOUS

## 2024-05-24 MED ORDER — CHLORHEXIDINE GLUCONATE 0.12 % MT SOLN: 15.0000 mL | Freq: Once | OROMUCOSAL | Status: AC

## 2024-05-24 MED ORDER — FENTANYL CITRATE PF 50 MCG/ML IJ SOSY
25.0000 ug | PREFILLED_SYRINGE | Freq: Once | INTRAMUSCULAR | Status: AC
  Administered 2024-05-24: 25 ug via INTRAVENOUS

## 2024-05-24 MED ORDER — ONDANSETRON HCL 4 MG/2ML IJ SOLN
INTRAMUSCULAR | Status: DC | PRN
  Administered 2024-05-24: 4 mg via INTRAVENOUS

## 2024-05-24 MED ORDER — CEFAZOLIN SODIUM-DEXTROSE 2-4 GM/100ML-% IV SOLN
2.0000 g | INTRAVENOUS | Status: AC
  Administered 2024-05-24: 2 g via INTRAVENOUS

## 2024-05-24 MED ORDER — FENTANYL CITRATE (PF) 250 MCG/5ML IJ SOLN
INTRAMUSCULAR | Status: AC
Start: 2024-05-24 — End: ?
  Filled 2024-05-24: qty 5

## 2024-05-24 MED ORDER — BUPIVACAINE LIPOSOME 1.3 % IJ SUSP
INTRAMUSCULAR | Status: DC | PRN
  Administered 2024-05-24: 10 mL via PERINEURAL

## 2024-05-24 MED ORDER — ORAL CARE MOUTH RINSE: 15.0000 mL | Freq: Once | OROMUCOSAL | Status: AC

## 2024-05-24 MED ORDER — DEXAMETHASONE SODIUM PHOSPHATE 10 MG/ML IJ SOLN
INTRAMUSCULAR | Status: AC
  Filled 2024-05-24: qty 1

## 2024-05-24 SURGICAL SUPPLY — 43 items
BAG COUNTER SPONGE SURGICOUNT (BAG) ×2 IMPLANT
BENZOIN TINCTURE PRP APPL 2/3 (GAUZE/BANDAGES/DRESSINGS) ×2 IMPLANT
BLADE CLIPPER SURG (BLADE) IMPLANT
CANISTER SUCTION 3000ML PPV (SUCTIONS) IMPLANT
CHLORAPREP W/TINT 26 (MISCELLANEOUS) ×2 IMPLANT
COVER SURGICAL LIGHT HANDLE (MISCELLANEOUS) ×2 IMPLANT
DRAIN PENROSE .5X12 LATEX STL (DRAIN) ×2 IMPLANT
DRAPE LAPAROSCOPIC ABDOMINAL (DRAPES) IMPLANT
DRAPE LAPAROTOMY 100X72 PEDS (DRAPES) ×2 IMPLANT
DRAPE LAPAROTOMY TRNSV 102X78 (DRAPES) IMPLANT
DRSG TEGADERM 4X4.75 (GAUZE/BANDAGES/DRESSINGS) ×2 IMPLANT
ELECT CAUTERY BLADE 6.4 (BLADE) ×2 IMPLANT
ELECTRODE REM PT RTRN 9FT ADLT (ELECTROSURGICAL) ×2 IMPLANT
GAUZE 4X4 16PLY ~~LOC~~+RFID DBL (SPONGE) ×2 IMPLANT
GAUZE SPONGE 2X2 8PLY STRL LF (GAUZE/BANDAGES/DRESSINGS) ×2 IMPLANT
GAUZE SPONGE 4X4 12PLY STRL (GAUZE/BANDAGES/DRESSINGS) ×2 IMPLANT
GLOVE BIO SURGEON STRL SZ7 (GLOVE) ×2 IMPLANT
GLOVE BIOGEL PI IND STRL 7.5 (GLOVE) ×2 IMPLANT
GOWN STRL REUS W/ TWL LRG LVL3 (GOWN DISPOSABLE) ×4 IMPLANT
KIT BASIN OR (CUSTOM PROCEDURE TRAY) ×2 IMPLANT
KIT TURNOVER KIT B (KITS) ×2 IMPLANT
MESH PARIETEX PROGRIP LEFT (Mesh General) IMPLANT
NDL HYPO 25GX1X1/2 BEV (NEEDLE) ×2 IMPLANT
NEEDLE HYPO 25GX1X1/2 BEV (NEEDLE) ×2 IMPLANT
NS IRRIG 1000ML POUR BTL (IV SOLUTION) ×2 IMPLANT
PACK GENERAL/GYN (CUSTOM PROCEDURE TRAY) ×2 IMPLANT
PAD ARMBOARD POSITIONER FOAM (MISCELLANEOUS) ×2 IMPLANT
SPONGE INTESTINAL PEANUT (DISPOSABLE) ×2 IMPLANT
STRIP CLOSURE SKIN 1/2X4 (GAUZE/BANDAGES/DRESSINGS) ×2 IMPLANT
SUT MNCRL AB 4-0 PS2 18 (SUTURE) ×2 IMPLANT
SUT NOVA NAB DX-16 0-1 5-0 T12 (SUTURE) ×2 IMPLANT
SUT NOVA NAB GS-21 0 18 T12 DT (SUTURE) ×2 IMPLANT
SUT PDS AB 0 CT 36 (SUTURE) IMPLANT
SUT SILK 2 0 SH (SUTURE) IMPLANT
SUT SILK 3-0 18XBRD TIE 12 (SUTURE) IMPLANT
SUT VIC AB 0 CT2 27 (SUTURE) ×2 IMPLANT
SUT VIC AB 2-0 SH 27X BRD (SUTURE) ×2 IMPLANT
SUT VIC AB 3-0 SH 27X BRD (SUTURE) ×2 IMPLANT
SUT VIC AB 3-0 SH 27XBRD (SUTURE) ×2 IMPLANT
SUT VICRYL 0 27 CT2 27 ABS (SUTURE) IMPLANT
SYR CONTROL 10ML LL (SYRINGE) ×2 IMPLANT
TOWEL GREEN STERILE (TOWEL DISPOSABLE) ×2 IMPLANT
TOWEL GREEN STERILE FF (TOWEL DISPOSABLE) ×2 IMPLANT

## 2024-05-24 NOTE — Discharge Instructions (Signed)

## 2024-05-24 NOTE — Transfer of Care (Signed)
 Immediate Anesthesia Transfer of Care Note  Patient: Blake Atkins  Procedure(s) Performed: REPAIR, HERNIA, UMBILICAL, ADULT REPAIR, HERNIA, INGUINAL, ADULT (Left)  Patient Location: PACU  Anesthesia Type:General  Level of Consciousness: drowsy  Airway & Oxygen Therapy: Patient Spontanous Breathing and Patient connected to face mask oxygen  Post-op Assessment: Report given to RN and Post -op Vital signs reviewed and stable  Post vital signs: Reviewed and stable  Last Vitals:  Vitals Value Taken Time  BP 124/99 05/24/24 1350  Temp 36.4 C 05/24/24 1350  Pulse 64 05/24/24 1356  Resp 17 05/24/24 1356  SpO2 96 % 05/24/24 1356  Vitals shown include unfiled device data.  Last Pain:  Vitals:   05/24/24 1350  PainSc: Asleep         Complications: No notable events documented.

## 2024-05-24 NOTE — Op Note (Signed)
 Left inguinal hernia repair with mesh/ umbilical hernia repair Procedure Note  Indications:  This is an 83 year old male with a history of carotid endarterectomy in 2009. The patient has progressive dementia and some aphasia but is otherwise quite mobile. He is accompanied by his wife. The patient previously played golf almost every day. However, recently his umbilical hernia has become more tender and he is no longer playing golf. He denies any symptoms in his left groin. The patient had a CT scan performed in July 2024 that showed a small fat-containing umbilical hernia and a small fat-containing left inguinal hernia. Due to the worsening symptoms and the slight enlargement, the patient and his wife present now to discuss surgical repair.   Pre-operative Diagnosis: left reducible inguinal hernia/ umbilical hernia Post-operative Diagnosis: same  Surgeon: Rella Cardinal   Assistants: None  Anesthesia: General LMA anesthesia and TAP block  ASA Class: 2  Procedure Details  The patient was seen again in the Holding Room. The risks, benefits, complications, treatment options, and expected outcomes were discussed with the patient. The possibilities of reaction to medication, pulmonary aspiration, perforation of viscus, bleeding, recurrent infection, the need for additional procedures, and development of a complication requiring transfusion or further operation were discussed with the patient and/or family. The likelihood of success in repairing the hernia and returning the patient to their previous functional status is good.  There was concurrence with the proposed plan, and informed consent was obtained. The site of surgery was properly noted/marked. The patient was taken to the Operating Room, identified as Blake Atkins, and the procedure verified as left inguinal hernia repair and umbilical hernia repair. A Time Out was held and the above information confirmed.  The patient was placed in the  supine position and underwent induction of anesthesia. The lower abdomen and groin was prepped with Chloraprep and draped in the standard fashion, and 0.25% Marcaine  with epinephrine was used to anesthetize the skin over the mid-portion of the inguinal canal. An oblique incision was made. Dissection was carried down through the subcutaneous tissue with cautery to the external oblique fascia.  We opened the external oblique fascia along the direction of its fibers to the external ring.  The spermatic cord was circumferentially dissected bluntly and retracted with a Penrose drain.  The ilioinguinal nerve was identified and preserved.  The floor of the inguinal canal was inspected and revealed a 1.5 cm direct hernia defect.  We dissected the hernia sac free from the surrounding tissues and reduced the direct hernia completely.  The floor of the inguinal canal was closed with 0 Vicryl.  We skeletonized the spermatic cord and reduced a small cord lipoma.  We used a left Progrip mesh which was inserted and deployed across the floor of the inguinal canal. The mesh was tucked underneath the external oblique fascia laterally.  The flap of the mesh was closed around the spermatic cord to recreate the internal inguinal ring.  The mesh was secured to the pubic tubercle with 0 Vicryl.  Additional 0 Vicryl sutures were used to secure the mesh to the shelving edge and to close the flap of mesh around the spermatic cord. The external oblique fascia was reapproximated with 2-0 Vicryl.  3-0 Vicryl was used to close the subcutaneous tissues and 4-0 Monocryl was used to close the skin in subcuticular fashion.    We then turned our attention to the umbilicus.   We made a transverse incision above the umbilicus after infiltrating with local anesthetic.  Dissection was carried down to the hernia sac with cautery.  We dissected bluntly around the hernia sac down to the edge of the fascial defect.  The hernia defect is only 5 mm.  The  hernia sac was amputated.  We cleared the fascia in all directions.  The fascial defect was closed with a single interrupted figure-of-eight 0 Novofil suture.  The base of the umbilicus was tacked down with 3-0 Vicryl.  3-0 Vicryl was used to close the subcutaneous tissues and 4-0 Monocryl was used to close the skin.  Steri-strips and clean dressing were applied.  The patient was extubated and brought to the recovery room in stable condition.  All sponge, instrument, and needle counts were correct prior to closure and at the conclusion of the case.     Estimated Blood Loss: Minimal                 Complications: None; patient tolerated the procedure well.         Disposition: PACU - hemodynamically stable.         Condition: stable  Blake Atkins. Blake Grizzle, MD, [facility] Surgery  General Surgery   05/24/2024 1:50 PM

## 2024-05-24 NOTE — Anesthesia Postprocedure Evaluation (Signed)
 Anesthesia Post Note  Patient: Blake Atkins  Procedure(s) Performed: REPAIR, HERNIA, UMBILICAL, ADULT REPAIR, HERNIA, INGUINAL, ADULT (Left)     Patient location during evaluation: PACU Anesthesia Type: General Level of consciousness: awake and alert and oriented Pain management: pain level controlled Vital Signs Assessment: post-procedure vital signs reviewed and stable Respiratory status: spontaneous breathing, nonlabored ventilation and respiratory function stable Cardiovascular status: blood pressure returned to baseline and stable Postop Assessment: no apparent nausea or vomiting Anesthetic complications: no   No notable events documented.  Last Vitals:  Vitals:   05/24/24 1400 05/24/24 1415  BP: (!) 147/79 (!) 144/68  Pulse: 63 (!) 59  Resp: 19 16  Temp:    SpO2: 95% 96%    Last Pain:  Vitals:   05/24/24 1350  PainSc: Asleep                 Irania Durell A.

## 2024-05-24 NOTE — H&P (Signed)
 Subjective    Chief Complaint: New Consultation (umbilical hernia)       History of Present Illness: Blake Atkins is a 83 y.o. male who is seen today as an office consultation at the request of Dr. Erma Hay for evaluation of New Consultation (umbilical hernia) .   This is an 83 year old male with a history of carotid endarterectomy in 2009.  The patient has progressive dementia and some aphasia but is otherwise quite mobile.  He is accompanied by his wife.  The patient previously played golf almost every day.  However, recently his umbilical hernia has become more tender and he is no longer playing golf.  He denies any symptoms in his left groin.  The patient had a CT scan performed in July 2024 that showed a small fat-containing umbilical hernia and a small fat-containing left inguinal hernia.  Due to the worsening symptoms and the slight enlargement, the patient and his wife present now to discuss surgical repair.  The patient normally takes a baby aspirin on a daily basis as a blood thinner.   Review of Systems: A complete review of systems was obtained from the patient.  I have reviewed this information and discussed as appropriate with the patient.  See HPI as well for other ROS.   Review of Systems  Constitutional: Negative.   HENT: Negative.    Eyes:  Positive for redness.  Respiratory: Negative.    Cardiovascular: Negative.   Gastrointestinal: Negative.   Genitourinary: Negative.   Musculoskeletal: Negative.   Skin: Negative.   Neurological: Negative.   Endo/Heme/Allergies: Negative.   Psychiatric/Behavioral:  Positive for memory loss. The patient is nervous/anxious.         Medical History: Past Medical History         Past Medical History:  Diagnosis Date   Arthritis     Hypertension          Problem List       Patient Active Problem List  Diagnosis   Umbilical hernia without obstruction or gangrene   Left inguinal hernia        Past Surgical History            Past Surgical History:  Procedure Laterality Date   CATARACT EXTRACTION       JOINT REPLACEMENT            Allergies  No Known Allergies      Medications Ordered Prior to Encounter             Current Outpatient Medications on File Prior to Visit  Medication Sig Dispense Refill   acetaminophen (TYLENOL) 500 MG tablet Take 1,000 mg by mouth       aspirin 81 MG EC tablet Take 81 mg by mouth once daily       donepeziL (ARICEPT) 10 MG tablet TAKE 1 TABLET BY MOUTH ONCE DAILY AT BEDTIME for 90 days       olmesartan (BENICAR) 20 MG tablet Take 1 tablet by mouth once daily       peg 400-propylene glycol, PF, (SYSTANE ULTRA) 0.4-0.3 % ophthalmic drops Place 1 drop into both eyes as needed for Dry Eyes       rosuvastatin (CRESTOR) 20 MG tablet TAKE 1 TABLET BY MOUTH DAILY for 90       sildenafiL (VIAGRA) 100 MG tablet TAKE 1/2 TO 1 TABLET BY MOUTH ONCE DAILY AS NEEDED for 30       vit A/vit C/vit E/zinc /copper (OCUVITE PRESERVISION ORAL) Take by  mouth        No current facility-administered medications on file prior to visit.        Family History  History reviewed. No pertinent family history.      Tobacco Use History  Social History         Tobacco Use  Smoking Status Never  Smokeless Tobacco Never        Social History  Social History           Socioeconomic History   Marital status: Married  Tobacco Use   Smoking status: Never   Smokeless tobacco: Never  Vaping Use   Vaping status: Never Used  Substance and Sexual Activity   Alcohol  use: Not Currently   Drug use: Never    Social Drivers of Health           Housing Stability: Unknown (04/20/2024)    Housing Stability Vital Sign     Homeless in the Last Year: No        Objective:             Vitals:    04/20/24 1119 04/20/24 1120  BP: (!) 143/87    Pulse: 72    Temp: 36.8 C (98.3 F)    SpO2: 99%    Weight: 86.5 kg (190 lb 9.6 oz)    Height: 175.3 cm (5\' 9" )    PainSc:     7   PainLoc:   Umbilicus    Body mass index is 28.15 kg/m.   Physical Exam    Constitutional:  WDWN in NAD, conversant, no obvious deformities; lying in bed comfortably Eyes:  Pupils equal, round; sclera anicteric; moist conjunctiva; no lid lag HENT:  Oral mucosa moist; good dentition  Neck:  No masses palpated, trachea midline; no thyromegaly Lungs:  CTA bilaterally; normal respiratory effort CV:  Regular rate and rhythm; no murmurs; extremities well-perfused with no edema Abd:  +bowel sounds, soft, non-tender, no palpable organomegaly; small protruding umbilical hernia that is not reducible.  The defect is likely less than 1 cm.  The hernia sac is only 1.5 cm. Musc:  Slow shuffling gait; no apparent clubbing or cyanosis in extremities GU: Bilateral descended testes, no testicular masses, scars from previous penile surgery.  No sign of right inguinal hernia.  Small reducible left inguinal hernia Lymphatic:  No palpable cervical or axillary lymphadenopathy Skin:  Warm, dry; no sign of jaundice Psychiatric - alert and oriented x 4; calm mood and affect     Labs, Imaging and Diagnostic Testing: CLINICAL DATA:  Umbilical hernia.   EXAM: CT ABDOMEN AND PELVIS WITH CONTRAST   TECHNIQUE: Multidetector CT imaging of the abdomen and pelvis was performed using the standard protocol following bolus administration of intravenous contrast.   RADIATION DOSE REDUCTION: This exam was performed according to the departmental dose-optimization program which includes automated exposure control, adjustment of the mA and/or kV according to patient size and/or use of iterative reconstruction technique.   CONTRAST:  80mL ISOVUE -300 IOPAMIDOL  (ISOVUE -300) INJECTION 61%   COMPARISON:  None Available.   FINDINGS: Lower Chest: No acute findings.   Hepatobiliary: No suspicious hepatic masses identified. Gallbladder is unremarkable. No evidence of biliary ductal dilatation.   Pancreas:  No mass or  inflammatory changes.   Spleen: Within normal limits in size and appearance.   Adrenals/Urinary Tract: No suspicious masses identified. No evidence of ureteral calculi or hydronephrosis.   Stomach/Bowel: No evidence of obstruction, inflammatory process or abnormal fluid collections.   Vascular/Lymphatic:  No pathologically enlarged lymph nodes. No acute vascular findings. Aortic atherosclerotic calcification incidentally noted.   Reproductive:  No mass or other significant abnormality.   Other: Small left inguinal hernia, which contains only fat. Tiny umbilical hernia also seen, which contains only fat.   Musculoskeletal: No suspicious bone lesions identified. Bilateral L5 pars defects incidentally noted, without associated spondylolisthesis.   IMPRESSION: Tiny umbilical hernia, which contains only fat.   Small left inguinal hernia, which contains only fat.   No acute findings.   Aortic Atherosclerosis (ICD10-I70.0).     Electronically Signed   By: Marlyce Sine M.D.   On: 07/05/2023 18:05   Assessment and Plan:  Diagnoses and all orders for this visit:   Umbilical hernia without obstruction or gangrene   Left inguinal hernia     Despite his dementia and aphasia, the patient remains fairly active.  His hernias have become more symptomatic, especially the umbilical hernia.  Recommend umbilical hernia repair and left inguinal hernia repair with mesh.  The umbilical hernia defect is likely less than a centimeter and should not require mesh.The surgical procedure has been discussed with the patient.  Potential risks, benefits, alternative treatments, and expected outcomes have been explained.  All of the patient's questions at this time have been answered.  The likelihood of reaching the patient's treatment goal is good.  The patient understands the proposed surgical procedure and wishes to proceed.  Kari Otto. Eli Grizzle, MD, [facility] Surgery  General  Surgery   05/24/2024 10:29 AM

## 2024-05-24 NOTE — Anesthesia Procedure Notes (Signed)
 Anesthesia Regional Block: TAP block   Pre-Anesthetic Checklist: , timeout performed,  Correct Patient, Correct Site, Correct Laterality,  Correct Procedure, Correct Position, site marked,  Risks and benefits discussed,  Surgical consent,  Pre-op evaluation,  At surgeon's request and post-op pain management  Laterality: Left  Prep: chloraprep       Needles:  Injection technique: Single-shot  Needle Type: Echogenic Stimulator Needle     Needle Length: 10cm  Needle Gauge: 21   Needle insertion depth: 7 cm   Additional Needles:   Procedures:,,,, ultrasound used (permanent image in chart),,    Narrative:  Start time: 05/24/2024 11:32 AM End time: 05/24/2024 11:37 AM Injection made incrementally with aspirations every 5 mL.  Performed by: Personally  Anesthesiologist: Tura Gaines, MD  Additional Notes: Timeout performed. Patient sedated. Relevant anatomy ID'd using US . Incremental 2-5ml injection of LA with frequent aspiration. Patient tolerated procedure well.

## 2024-05-24 NOTE — Anesthesia Procedure Notes (Signed)
 Procedure Name: Intubation Date/Time: 05/24/2024 12:36 PM  Performed by: Eon Zunker J, CRNAPre-anesthesia Checklist: Patient identified, Emergency Drugs available, Suction available and Patient being monitored Patient Re-evaluated:Patient Re-evaluated prior to induction Oxygen Delivery Method: Circle System Utilized Preoxygenation: Pre-oxygenation with 100% oxygen Induction Type: IV induction Ventilation: Mask ventilation without difficulty Laryngoscope Size: Miller and 3 Tube type: Oral Tube size: 8.0 mm Number of attempts: 1 Airway Equipment and Method: Stylet and Oral airway Placement Confirmation: ETT inserted through vocal cords under direct vision, positive ETCO2 and breath sounds checked- equal and bilateral Secured at: 23 cm Tube secured with: Tape Dental Injury: Teeth and Oropharynx as per pre-operative assessment

## 2024-05-25 ENCOUNTER — Encounter (HOSPITAL_COMMUNITY): Payer: Self-pay | Admitting: Surgery

## 2024-05-25 LAB — SURGICAL PATHOLOGY

## 2024-06-20 DIAGNOSIS — E785 Hyperlipidemia, unspecified: Secondary | ICD-10-CM | POA: Diagnosis not present

## 2024-06-20 DIAGNOSIS — I129 Hypertensive chronic kidney disease with stage 1 through stage 4 chronic kidney disease, or unspecified chronic kidney disease: Secondary | ICD-10-CM | POA: Diagnosis not present

## 2024-06-20 DIAGNOSIS — N1831 Chronic kidney disease, stage 3a: Secondary | ICD-10-CM | POA: Diagnosis not present

## 2024-06-20 DIAGNOSIS — R972 Elevated prostate specific antigen [PSA]: Secondary | ICD-10-CM | POA: Diagnosis not present

## 2024-07-06 DIAGNOSIS — Z1331 Encounter for screening for depression: Secondary | ICD-10-CM | POA: Diagnosis not present

## 2024-07-06 DIAGNOSIS — H3530 Unspecified macular degeneration: Secondary | ICD-10-CM | POA: Diagnosis not present

## 2024-07-06 DIAGNOSIS — Z0000 Encounter for general adult medical examination without abnormal findings: Secondary | ICD-10-CM | POA: Diagnosis not present

## 2024-07-06 DIAGNOSIS — G300 Alzheimer's disease with early onset: Secondary | ICD-10-CM | POA: Diagnosis not present

## 2024-07-06 DIAGNOSIS — Z1339 Encounter for screening examination for other mental health and behavioral disorders: Secondary | ICD-10-CM | POA: Diagnosis not present

## 2024-07-06 DIAGNOSIS — N1831 Chronic kidney disease, stage 3a: Secondary | ICD-10-CM | POA: Diagnosis not present

## 2024-07-06 DIAGNOSIS — R82998 Other abnormal findings in urine: Secondary | ICD-10-CM | POA: Diagnosis not present

## 2024-07-06 DIAGNOSIS — I6529 Occlusion and stenosis of unspecified carotid artery: Secondary | ICD-10-CM | POA: Diagnosis not present

## 2024-07-06 DIAGNOSIS — E785 Hyperlipidemia, unspecified: Secondary | ICD-10-CM | POA: Diagnosis not present

## 2024-07-06 DIAGNOSIS — Z860101 Personal history of adenomatous and serrated colon polyps: Secondary | ICD-10-CM | POA: Diagnosis not present

## 2024-07-06 DIAGNOSIS — I129 Hypertensive chronic kidney disease with stage 1 through stage 4 chronic kidney disease, or unspecified chronic kidney disease: Secondary | ICD-10-CM | POA: Diagnosis not present

## 2024-07-06 DIAGNOSIS — R4701 Aphasia: Secondary | ICD-10-CM | POA: Diagnosis not present

## 2024-07-06 DIAGNOSIS — I10 Essential (primary) hypertension: Secondary | ICD-10-CM | POA: Diagnosis not present

## 2024-07-06 DIAGNOSIS — F02B0 Dementia in other diseases classified elsewhere, moderate, without behavioral disturbance, psychotic disturbance, mood disturbance, and anxiety: Secondary | ICD-10-CM | POA: Diagnosis not present

## 2024-08-13 ENCOUNTER — Inpatient Hospital Stay (HOSPITAL_COMMUNITY)
Admission: EM | Admit: 2024-08-13 | Discharge: 2024-08-16 | DRG: 072 | Disposition: A | Discharge status: Home Health | Attending: Internal Medicine | Admitting: Internal Medicine

## 2024-08-13 ENCOUNTER — Emergency Department (HOSPITAL_COMMUNITY)

## 2024-08-13 ENCOUNTER — Other Ambulatory Visit: Payer: Self-pay

## 2024-08-13 DIAGNOSIS — I672 Cerebral atherosclerosis: Secondary | ICD-10-CM | POA: Diagnosis not present

## 2024-08-13 DIAGNOSIS — Z96651 Presence of right artificial knee joint: Secondary | ICD-10-CM | POA: Diagnosis present

## 2024-08-13 DIAGNOSIS — Z8673 Personal history of transient ischemic attack (TIA), and cerebral infarction without residual deficits: Secondary | ICD-10-CM | POA: Diagnosis not present

## 2024-08-13 DIAGNOSIS — I69320 Aphasia following cerebral infarction: Secondary | ICD-10-CM

## 2024-08-13 DIAGNOSIS — Z9842 Cataract extraction status, left eye: Secondary | ICD-10-CM | POA: Diagnosis not present

## 2024-08-13 DIAGNOSIS — Z961 Presence of intraocular lens: Secondary | ICD-10-CM | POA: Diagnosis present

## 2024-08-13 DIAGNOSIS — R4701 Aphasia: Secondary | ICD-10-CM | POA: Diagnosis not present

## 2024-08-13 DIAGNOSIS — R55 Syncope and collapse: Secondary | ICD-10-CM | POA: Diagnosis present

## 2024-08-13 DIAGNOSIS — G309 Alzheimer's disease, unspecified: Secondary | ICD-10-CM | POA: Diagnosis present

## 2024-08-13 DIAGNOSIS — N1832 Chronic kidney disease, stage 3b: Secondary | ICD-10-CM | POA: Diagnosis present

## 2024-08-13 DIAGNOSIS — M13861 Other specified arthritis, right knee: Secondary | ICD-10-CM | POA: Diagnosis present

## 2024-08-13 DIAGNOSIS — W06XXXA Fall from bed, initial encounter: Secondary | ICD-10-CM | POA: Diagnosis present

## 2024-08-13 DIAGNOSIS — G9341 Metabolic encephalopathy: Secondary | ICD-10-CM | POA: Diagnosis present

## 2024-08-13 DIAGNOSIS — Z7982 Long term (current) use of aspirin: Secondary | ICD-10-CM

## 2024-08-13 DIAGNOSIS — M109 Gout, unspecified: Secondary | ICD-10-CM | POA: Diagnosis present

## 2024-08-13 DIAGNOSIS — E785 Hyperlipidemia, unspecified: Secondary | ICD-10-CM | POA: Diagnosis present

## 2024-08-13 DIAGNOSIS — G250 Essential tremor: Secondary | ICD-10-CM | POA: Diagnosis present

## 2024-08-13 DIAGNOSIS — F0390 Unspecified dementia without behavioral disturbance: Secondary | ICD-10-CM | POA: Diagnosis not present

## 2024-08-13 DIAGNOSIS — R569 Unspecified convulsions: Principal | ICD-10-CM

## 2024-08-13 DIAGNOSIS — Z9841 Cataract extraction status, right eye: Secondary | ICD-10-CM | POA: Diagnosis not present

## 2024-08-13 DIAGNOSIS — I10 Essential (primary) hypertension: Secondary | ICD-10-CM | POA: Insufficient documentation

## 2024-08-13 DIAGNOSIS — Z8659 Personal history of other mental and behavioral disorders: Secondary | ICD-10-CM | POA: Diagnosis not present

## 2024-08-13 DIAGNOSIS — Z79899 Other long term (current) drug therapy: Secondary | ICD-10-CM | POA: Diagnosis not present

## 2024-08-13 DIAGNOSIS — R159 Full incontinence of feces: Secondary | ICD-10-CM | POA: Diagnosis present

## 2024-08-13 DIAGNOSIS — R531 Weakness: Secondary | ICD-10-CM | POA: Diagnosis present

## 2024-08-13 DIAGNOSIS — R4182 Altered mental status, unspecified: Secondary | ICD-10-CM | POA: Diagnosis not present

## 2024-08-13 DIAGNOSIS — M7989 Other specified soft tissue disorders: Secondary | ICD-10-CM | POA: Diagnosis not present

## 2024-08-13 DIAGNOSIS — R29818 Other symptoms and signs involving the nervous system: Secondary | ICD-10-CM | POA: Diagnosis not present

## 2024-08-13 DIAGNOSIS — M25561 Pain in right knee: Secondary | ICD-10-CM | POA: Diagnosis not present

## 2024-08-13 DIAGNOSIS — M25461 Effusion, right knee: Secondary | ICD-10-CM | POA: Diagnosis present

## 2024-08-13 DIAGNOSIS — F0280 Dementia in other diseases classified elsewhere without behavioral disturbance: Secondary | ICD-10-CM | POA: Diagnosis present

## 2024-08-13 DIAGNOSIS — Z833 Family history of diabetes mellitus: Secondary | ICD-10-CM

## 2024-08-13 DIAGNOSIS — I129 Hypertensive chronic kidney disease with stage 1 through stage 4 chronic kidney disease, or unspecified chronic kidney disease: Secondary | ICD-10-CM | POA: Diagnosis present

## 2024-08-13 DIAGNOSIS — G20A1 Parkinson's disease without dyskinesia, without mention of fluctuations: Secondary | ICD-10-CM | POA: Diagnosis present

## 2024-08-13 DIAGNOSIS — M1711 Unilateral primary osteoarthritis, right knee: Secondary | ICD-10-CM | POA: Diagnosis not present

## 2024-08-13 DIAGNOSIS — Z0389 Encounter for observation for other suspected diseases and conditions ruled out: Secondary | ICD-10-CM | POA: Diagnosis not present

## 2024-08-13 LAB — CBC WITH DIFFERENTIAL/PLATELET
Abs Immature Granulocytes: 0.04 K/uL (ref 0.00–0.07)
Basophils Absolute: 0.1 K/uL (ref 0.0–0.1)
Basophils Relative: 1 %
Eosinophils Absolute: 0 K/uL (ref 0.0–0.5)
Eosinophils Relative: 0 %
HCT: 45.4 % (ref 39.0–52.0)
Hemoglobin: 15.1 g/dL (ref 13.0–17.0)
Immature Granulocytes: 1 %
Lymphocytes Relative: 14 %
Lymphs Abs: 1.3 K/uL (ref 0.7–4.0)
MCH: 33.7 pg (ref 26.0–34.0)
MCHC: 33.3 g/dL (ref 30.0–36.0)
MCV: 101.3 fL — ABNORMAL HIGH (ref 80.0–100.0)
Monocytes Absolute: 1 K/uL (ref 0.1–1.0)
Monocytes Relative: 11 %
Neutro Abs: 6.4 K/uL (ref 1.7–7.7)
Neutrophils Relative %: 73 %
Platelets: 269 K/uL (ref 150–400)
RBC: 4.48 MIL/uL (ref 4.22–5.81)
RDW: 12.4 % (ref 11.5–15.5)
WBC: 8.7 K/uL (ref 4.0–10.5)
nRBC: 0 % (ref 0.0–0.2)

## 2024-08-13 LAB — COMPREHENSIVE METABOLIC PANEL WITH GFR
ALT: 28 U/L (ref 0–44)
AST: 28 U/L (ref 15–41)
Albumin: 3.4 g/dL — ABNORMAL LOW (ref 3.5–5.0)
Alkaline Phosphatase: 92 U/L (ref 38–126)
Anion gap: 11 (ref 5–15)
BUN: 20 mg/dL (ref 8–23)
CO2: 24 mmol/L (ref 22–32)
Calcium: 9.4 mg/dL (ref 8.9–10.3)
Chloride: 103 mmol/L (ref 98–111)
Creatinine, Ser: 1.43 mg/dL — ABNORMAL HIGH (ref 0.61–1.24)
GFR, Estimated: 49 mL/min — ABNORMAL LOW (ref >60)
Glucose, Bld: 137 mg/dL — ABNORMAL HIGH (ref 70–99)
Potassium: 4.2 mmol/L (ref 3.5–5.1)
Sodium: 138 mmol/L (ref 135–145)
Total Bilirubin: 1.3 mg/dL — ABNORMAL HIGH (ref 0.0–1.2)
Total Protein: 7.3 g/dL (ref 6.5–8.1)

## 2024-08-13 LAB — PROTIME-INR
INR: 1.1 (ref 0.8–1.2)
Prothrombin Time: 14.3 s (ref 11.4–15.2)

## 2024-08-13 LAB — RESP PANEL BY RT-PCR (RSV, FLU A&B, COVID)  RVPGX2
Influenza A by PCR: NEGATIVE
Influenza B by PCR: NEGATIVE
Resp Syncytial Virus by PCR: NEGATIVE
SARS Coronavirus 2 by RT PCR: NEGATIVE

## 2024-08-13 LAB — I-STAT CG4 LACTIC ACID, ED
Lactic Acid, Venous: 1.1 mmol/L (ref 0.5–1.9)
Lactic Acid, Venous: 1.5 mmol/L (ref 0.5–1.9)

## 2024-08-13 LAB — TROPONIN I (HIGH SENSITIVITY): Troponin I (High Sensitivity): 13 ng/L (ref <18)

## 2024-08-13 MED ORDER — DIAZEPAM 5 MG/ML IJ SOLN: 2.5000 mg | Freq: Once | INTRAMUSCULAR | Status: DC | PRN
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
## 2024-08-13 NOTE — ED Provider Notes (Signed)
 Neuro to see  Anticipate admission for syncope vs seizure vs AMS   Vicky Charleston, PA-C 08/14/24 0544    Theadore Ozell HERO, MD 08/16/24 [phone]
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
## 2024-08-13 NOTE — ED Triage Notes (Signed)
 Pt bib [facility] from home where he was walking from the bathroom back to bed, he just slid off the bed. He did not hit his head and does not have any injuries from the fall. Wife c/o pt having generalized weakness today and just not acting himself. Pt has dementia and aphasia// arrives to ED at baseline.
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
## 2024-08-14 ENCOUNTER — Inpatient Hospital Stay (HOSPITAL_COMMUNITY)

## 2024-08-14 ENCOUNTER — Emergency Department (HOSPITAL_COMMUNITY)

## 2024-08-14 ENCOUNTER — Encounter (HOSPITAL_COMMUNITY): Payer: Self-pay | Admitting: Internal Medicine

## 2024-08-14 DIAGNOSIS — Z8659 Personal history of other mental and behavioral disorders: Secondary | ICD-10-CM

## 2024-08-14 DIAGNOSIS — N1832 Chronic kidney disease, stage 3b: Secondary | ICD-10-CM | POA: Diagnosis present

## 2024-08-14 DIAGNOSIS — R55 Syncope and collapse: Secondary | ICD-10-CM

## 2024-08-14 DIAGNOSIS — F0280 Dementia in other diseases classified elsewhere without behavioral disturbance: Secondary | ICD-10-CM | POA: Diagnosis present

## 2024-08-14 DIAGNOSIS — G250 Essential tremor: Secondary | ICD-10-CM | POA: Diagnosis present

## 2024-08-14 DIAGNOSIS — I10 Essential (primary) hypertension: Secondary | ICD-10-CM | POA: Insufficient documentation

## 2024-08-14 DIAGNOSIS — R569 Unspecified convulsions: Principal | ICD-10-CM | POA: Insufficient documentation

## 2024-08-14 DIAGNOSIS — Z79899 Other long term (current) drug therapy: Secondary | ICD-10-CM | POA: Diagnosis not present

## 2024-08-14 DIAGNOSIS — E785 Hyperlipidemia, unspecified: Secondary | ICD-10-CM | POA: Diagnosis present

## 2024-08-14 DIAGNOSIS — R159 Full incontinence of feces: Secondary | ICD-10-CM | POA: Diagnosis present

## 2024-08-14 DIAGNOSIS — R4701 Aphasia: Secondary | ICD-10-CM | POA: Insufficient documentation

## 2024-08-14 DIAGNOSIS — M7989 Other specified soft tissue disorders: Secondary | ICD-10-CM | POA: Diagnosis not present

## 2024-08-14 DIAGNOSIS — Z9842 Cataract extraction status, left eye: Secondary | ICD-10-CM | POA: Diagnosis not present

## 2024-08-14 DIAGNOSIS — W06XXXA Fall from bed, initial encounter: Secondary | ICD-10-CM | POA: Diagnosis present

## 2024-08-14 DIAGNOSIS — Z8673 Personal history of transient ischemic attack (TIA), and cerebral infarction without residual deficits: Secondary | ICD-10-CM

## 2024-08-14 DIAGNOSIS — G9341 Metabolic encephalopathy: Secondary | ICD-10-CM | POA: Diagnosis present

## 2024-08-14 DIAGNOSIS — I129 Hypertensive chronic kidney disease with stage 1 through stage 4 chronic kidney disease, or unspecified chronic kidney disease: Secondary | ICD-10-CM | POA: Diagnosis present

## 2024-08-14 DIAGNOSIS — Z9841 Cataract extraction status, right eye: Secondary | ICD-10-CM | POA: Diagnosis not present

## 2024-08-14 DIAGNOSIS — Z833 Family history of diabetes mellitus: Secondary | ICD-10-CM | POA: Diagnosis not present

## 2024-08-14 DIAGNOSIS — G20A1 Parkinson's disease without dyskinesia, without mention of fluctuations: Secondary | ICD-10-CM | POA: Diagnosis present

## 2024-08-14 DIAGNOSIS — M13861 Other specified arthritis, right knee: Secondary | ICD-10-CM | POA: Diagnosis present

## 2024-08-14 DIAGNOSIS — R4182 Altered mental status, unspecified: Secondary | ICD-10-CM

## 2024-08-14 DIAGNOSIS — Z7982 Long term (current) use of aspirin: Secondary | ICD-10-CM | POA: Diagnosis not present

## 2024-08-14 DIAGNOSIS — Z96651 Presence of right artificial knee joint: Secondary | ICD-10-CM | POA: Diagnosis present

## 2024-08-14 DIAGNOSIS — G309 Alzheimer's disease, unspecified: Secondary | ICD-10-CM | POA: Diagnosis present

## 2024-08-14 DIAGNOSIS — M109 Gout, unspecified: Secondary | ICD-10-CM | POA: Diagnosis present

## 2024-08-14 DIAGNOSIS — Z961 Presence of intraocular lens: Secondary | ICD-10-CM | POA: Diagnosis present

## 2024-08-14 DIAGNOSIS — M25461 Effusion, right knee: Secondary | ICD-10-CM | POA: Diagnosis present

## 2024-08-14 DIAGNOSIS — R531 Weakness: Secondary | ICD-10-CM | POA: Diagnosis present

## 2024-08-14 DIAGNOSIS — I69320 Aphasia following cerebral infarction: Secondary | ICD-10-CM | POA: Diagnosis not present

## 2024-08-14 LAB — CBC
HCT: 41.6 % (ref 39.0–52.0)
Hemoglobin: 14.4 g/dL (ref 13.0–17.0)
MCH: 34.4 pg — ABNORMAL HIGH (ref 26.0–34.0)
MCHC: 34.6 g/dL (ref 30.0–36.0)
MCV: 99.5 fL (ref 80.0–100.0)
Platelets: 232 K/uL (ref 150–400)
RBC: 4.18 MIL/uL — ABNORMAL LOW (ref 4.22–5.81)
RDW: 12.4 % (ref 11.5–15.5)
WBC: 8.2 K/uL (ref 4.0–10.5)
nRBC: 0 % (ref 0.0–0.2)

## 2024-08-14 LAB — COMPREHENSIVE METABOLIC PANEL WITH GFR
ALT: 26 U/L (ref 0–44)
AST: 28 U/L (ref 15–41)
Albumin: 3 g/dL — ABNORMAL LOW (ref 3.5–5.0)
Alkaline Phosphatase: 81 U/L (ref 38–126)
Anion gap: 10 (ref 5–15)
BUN: 18 mg/dL (ref 8–23)
CO2: 21 mmol/L — ABNORMAL LOW (ref 22–32)
Calcium: 9.2 mg/dL (ref 8.9–10.3)
Chloride: 108 mmol/L (ref 98–111)
Creatinine, Ser: 1.24 mg/dL (ref 0.61–1.24)
GFR, Estimated: 58 mL/min — ABNORMAL LOW (ref >60)
Glucose, Bld: 105 mg/dL — ABNORMAL HIGH (ref 70–99)
Potassium: 3.9 mmol/L (ref 3.5–5.1)
Sodium: 139 mmol/L (ref 135–145)
Total Bilirubin: 1.5 mg/dL — ABNORMAL HIGH (ref 0.0–1.2)
Total Protein: 6.8 g/dL (ref 6.5–8.1)

## 2024-08-14 LAB — C-REACTIVE PROTEIN: CRP: 8.6 mg/dL — ABNORMAL HIGH (ref <1.0)

## 2024-08-14 LAB — URINALYSIS, W/ REFLEX TO CULTURE (INFECTION SUSPECTED)
Bacteria, UA: NONE SEEN
Bilirubin Urine: NEGATIVE
Glucose, UA: NEGATIVE mg/dL
Hgb urine dipstick: NEGATIVE
Ketones, ur: 5 mg/dL — AB
Leukocytes,Ua: NEGATIVE
Nitrite: NEGATIVE
Protein, ur: NEGATIVE mg/dL
Specific Gravity, Urine: 1.026 (ref 1.005–1.030)
pH: 6 (ref 5.0–8.0)

## 2024-08-14 LAB — ECHOCARDIOGRAM COMPLETE
Area-P 1/2: 3.65 cm2
S' Lateral: 2.4 cm
Weight: 3051.17 [oz_av]

## 2024-08-14 LAB — SYNOVIAL CELL COUNT + DIFF, W/ CRYSTALS
Eosinophils-Synovial: 0 % (ref 0–1)
Lymphocytes-Synovial Fld: 11 % (ref 0–20)
Monocyte-Macrophage-Synovial Fluid: 8 % — ABNORMAL LOW (ref 50–90)
Neutrophil, Synovial: 81 % — ABNORMAL HIGH (ref 0–25)
WBC, Synovial: 10650 /mm3 — ABNORMAL HIGH (ref 0–200)

## 2024-08-14 LAB — URIC ACID: Uric Acid, Serum: 4.7 mg/dL (ref 3.7–8.6)

## 2024-08-14 LAB — TROPONIN I (HIGH SENSITIVITY): Troponin I (High Sensitivity): 12 ng/L (ref <18)

## 2024-08-14 LAB — SEDIMENTATION RATE: Sed Rate: 30 mm/h — ABNORMAL HIGH (ref 0–16)

## 2024-08-14 MED ORDER — DIAZEPAM 5 MG/ML IJ SOLN: 2.5000 mg | Freq: Four times a day (QID) | INTRAMUSCULAR | Status: DC | PRN

## 2024-08-14 MED ORDER — COLCHICINE 0.6 MG PO TABS
0.6000 mg | ORAL_TABLET | Freq: Once | ORAL | Status: AC
  Administered 2024-08-15: 0.6 mg via ORAL
  Filled 2024-08-14: qty 1

## 2024-08-14 MED ORDER — ACETAMINOPHEN 650 MG RE SUPP: 650.0000 mg | Freq: Four times a day (QID) | RECTAL | Status: DC | PRN

## 2024-08-14 MED ORDER — COLCHICINE 0.6 MG PO TABS
1.2000 mg | ORAL_TABLET | Freq: Once | ORAL | Status: AC
  Administered 2024-08-14: 1.2 mg via ORAL
  Filled 2024-08-14: qty 2

## 2024-08-14 MED ORDER — SODIUM CHLORIDE 0.9% FLUSH: 3.0000 mL | INTRAVENOUS | Status: DC | PRN

## 2024-08-14 MED ORDER — DIVALPROEX SODIUM 125 MG PO DR TAB
125.0000 mg | DELAYED_RELEASE_TABLET | Freq: Two times a day (BID) | ORAL | Status: DC
  Administered 2024-08-14: 125 mg via ORAL
  Filled 2024-08-14 (×2): qty 1

## 2024-08-14 MED ORDER — ACETAMINOPHEN 325 MG PO TABS
650.0000 mg | ORAL_TABLET | Freq: Four times a day (QID) | ORAL | Status: DC | PRN
  Administered 2024-08-14 – 2024-08-15 (×2): 650 mg via ORAL
  Filled 2024-08-14 (×2): qty 2

## 2024-08-14 MED ORDER — IRBESARTAN 75 MG PO TABS
37.5000 mg | ORAL_TABLET | Freq: Every day | ORAL | Status: DC
  Administered 2024-08-14 – 2024-08-16 (×3): 37.5 mg via ORAL
  Filled 2024-08-14 (×4): qty 0.5

## 2024-08-14 MED ORDER — DOCUSATE SODIUM 100 MG PO CAPS
100.0000 mg | ORAL_CAPSULE | Freq: Two times a day (BID) | ORAL | Status: DC
  Administered 2024-08-14 – 2024-08-16 (×5): 100 mg via ORAL
  Filled 2024-08-14 (×5): qty 1

## 2024-08-14 MED ORDER — HEPARIN SODIUM (PORCINE) 5000 UNIT/ML IJ SOLN
5000.0000 [IU] | Freq: Three times a day (TID) | INTRAMUSCULAR | Status: DC
  Administered 2024-08-14 – 2024-08-16 (×7): 5000 [IU] via SUBCUTANEOUS
  Filled 2024-08-14 (×7): qty 1

## 2024-08-14 MED ORDER — POLYVINYL ALCOHOL 1.4 % OP SOLN
1.0000 [drp] | OPHTHALMIC | Status: DC | PRN
  Filled 2024-08-14: qty 15

## 2024-08-14 MED ORDER — DONEPEZIL HCL 10 MG PO TABS
10.0000 mg | ORAL_TABLET | Freq: Every day | ORAL | Status: DC
  Administered 2024-08-14 – 2024-08-15 (×3): 10 mg via ORAL
  Filled 2024-08-14 (×3): qty 1

## 2024-08-14 MED ORDER — SODIUM CHLORIDE 0.9% FLUSH
3.0000 mL | Freq: Two times a day (BID) | INTRAVENOUS | Status: DC
  Administered 2024-08-14 – 2024-08-15 (×3): 3 mL via INTRAVENOUS

## 2024-08-14 MED ORDER — ONDANSETRON HCL 4 MG PO TABS: 4.0000 mg | ORAL_TABLET | Freq: Four times a day (QID) | ORAL | Status: DC | PRN

## 2024-08-14 MED ORDER — SODIUM CHLORIDE 0.9 % IV SOLN: INTRAVENOUS | Status: AC

## 2024-08-14 MED ORDER — ROSUVASTATIN CALCIUM 20 MG PO TABS
20.0000 mg | ORAL_TABLET | Freq: Every day | ORAL | Status: DC
  Administered 2024-08-14 – 2024-08-16 (×3): 20 mg via ORAL
  Filled 2024-08-14 (×3): qty 1

## 2024-08-14 MED ORDER — SODIUM CHLORIDE 0.9 % IV SOLN: 250.0000 mL | INTRAVENOUS | Status: AC | PRN

## 2024-08-14 MED ORDER — COLCHICINE 0.6 MG PO TABS
0.6000 mg | ORAL_TABLET | Freq: Every day | ORAL | Status: DC
  Administered 2024-08-15 – 2024-08-16 (×2): 0.6 mg via ORAL
  Filled 2024-08-14 (×2): qty 1

## 2024-08-14 MED ORDER — ASPIRIN 81 MG PO TBEC
81.0000 mg | DELAYED_RELEASE_TABLET | Freq: Every day | ORAL | Status: DC
  Administered 2024-08-14 – 2024-08-16 (×3): 81 mg via ORAL
  Filled 2024-08-14 (×3): qty 1

## 2024-08-14 MED ORDER — ONDANSETRON HCL 4 MG/2ML IJ SOLN
4.0000 mg | Freq: Four times a day (QID) | INTRAMUSCULAR | Status: DC | PRN
  Administered 2024-08-15: 4 mg via INTRAVENOUS
  Filled 2024-08-14: qty 2

## 2024-08-14 NOTE — Progress Notes (Signed)
 Echocardiogram 2D Echocardiogram has been performed.  Damien FALCON Darianny Momon RDCS 08/14/2024, 1:14 PM

## 2024-08-14 NOTE — Progress Notes (Addendum)
 Progress Note   Patient: Blake Atkins FMW:[id number] DOB: 1941-07-30 DOA: 08/13/2024     0 DOS: the patient was seen and examined on 08/14/2024   Brief hospital course:  83 y.o. male with medical history significant of dementia complicated by expressive aphasia, essential hypertension, CVA, hyperlipidemia and CKD stage III presented emergency department transient episodes of lower extremity tremulousness, bowel incontinence and confusion. CT head and MRI brain negative for stroke. No epileptiform activities on EEG. He had swollen and tender Right knee and lower leg so a CT has been ordered.   Assessment and Plan:  Acute metabolic encephalopathy,POA: No evidence of acute stroke. I have reviewed both CT head and MRI brain. Patient has expressive aphasia at baseline No fever or neck stiffness to suggest meningitis No growth on blood cultures He does have right knee tenderness, warmth and swelling. An active infection there will explain acute worsening of his mentation Close neuro checks Neurology on board. Discussed with Dr Michaela on 8/19 and depakote  will be discontinued. No evidence of epileptiform activity on the EEG. It does show moderate diffuse encephalopathy.   Right knee swelling and tenderness, concern for septic arthritis vs acute gout H/o prior right knee replacement Ordered CT right knee F/u Uric acid level. No prior history of gout F/u RLE venous doppler to rule out DVT Discussed with ortho PA Ozell Purchase who did arthrocentesis and sent fluid for analysis. Management will depend on fluid analysis and CT right knee results.    Alzheimer's dementia,POA: Continue with memantine  Essential Hypertension: Takes Olmesartan at home. BP has been up and down here in the hospital.  CKD 3A: continue hydration  Disposition: Lives at home with his wife. Will consult PT OT.        Subjective: Patient is still confused and disoriented. He has expressive aphasia at  baseline. Family present at the bedside. We discussed about the results of CT brain, MRI brain and EEG. Patient is active at baseline and can walk without any assistive devices. He developed LE weakness, confusion and tremulousness yesterday and had one episode of bowel incontinence.  Physical Exam: Vitals:   08/14/24 0600 08/14/24 0630 08/14/24 0934 08/14/24 0943  BP: (!) 143/76 (!) 147/80 128/78   Pulse: (!) 57 63 (!) 53   Resp: 19 19 17    Temp:    98.1 F (36.7 C)  TempSrc:    Oral  SpO2: 98% 98% 97%   Weight:       Constitutional: appears confused and disoriented. Expressive aphasia at baseline. Eyes: PERRL. Slight conjunctival injection ENMT: Mucous membranes are moist. Posterior pharynx clear of any exudate or lesions. Neck: normal, supple, no masses, no thyromegaly Respiratory: clear to auscultation bilaterally, no wheezing, no crackles. Normal respiratory effort. No accessory muscle use.  Cardiovascular: Bradycardia, no murmurs / rubs / gallops. No extremity edema. 2+ pedal pulses. No carotid bruits.  Abdomen: no tenderness, no masses palpated. No hepatosplenomegaly. Bowel sounds positive. Scar from prior right inguinal hernia surgery and prior appendectomy Musculoskeletal: Scar from prior knee replacement, Right knee is swollen, warm and tender, no erythema. Swelling is extending into right lower leg Skin: no rashes, lesions, ulcers. No induration Neurologic: appears confused and disoriented, unable to follow commands Psychiatric: Disoriented and confused   Data Reviewed:  There are no new results to review at this time.  Family Communication: Family at the bedside  Disposition: Status is: Inpatient Remains inpatient appropriate because: AMS  Planned Discharge Destination: Home with Home Health  Time spent: 43 minutes  Author: Deliliah Room, MD 08/14/2024 2:58 PM  For on call review [email or web address].

## 2024-08-14 NOTE — Progress Notes (Addendum)
 Pt arrived to 5W rm 10, Pt is anxious, and sensitive to touch, AAOx1, wife is at bedside, VSS, oriented to unit, call light within reach, tele started.    Laneta JAYSON Rao, RN 08/14/2024 1800

## 2024-08-14 NOTE — Progress Notes (Signed)
 RLE venous exam is completed. Hason Ofarrell, RVT
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
## 2024-08-14 NOTE — Procedures (Signed)
 Procedure: Right knee aspiration and injection   Indication: Right knee effusion(s)   Surgeon: Ozell Ned, PA-C   Assist: None   Anesthesia: Topical refrigerant   EBL: None   Complications: None   Findings: After risks/benefits explained patient desires to undergo procedure. Consent obtained and time out performed. The right knee was sterilely prepped and aspirated. 45ml dark yellow fluid obtained. Pt tolerated the procedure well.       Ozell DOROTHA Ned, PA-C Orthopedic Surgery [phone]

## 2024-08-14 NOTE — Plan of Care (Signed)
 Patient saw up at the bedside requesting for EEG right now as per patient's wife patient is at his baseline now.  Per family request point-of-care EEG has been ordered.

## 2024-08-14 NOTE — Procedures (Signed)
 Patient Name: Blake Atkins  MRN: [id number]  Epilepsy Attending: Arlin MALVA Krebs  Referring Physician/Provider: Sundil, Subrina, MD  Date: 08/14/2024 Duration: 21.58 mins  Patient history: 83 y.o. male with history of dementia, expressive aphasia, essential tremor, HTN, bilateral carotid stenosis (s/p R CEA 2009), CKD III who presents after a transient episode of inability to follow instructions, bowel incontinence. EEG to evaluate for seizure  Level of alertness: Awake  AEDs during EEG study: VPA  Technical aspects: This EEG study was done with scalp electrodes positioned according to the 10-20 International system of electrode placement. Electrical activity was reviewed with band pass filter of 1-70Hz , sensitivity of 7 uV/mm, display speed of 89mm/sec with a 60Hz  notched filter applied as appropriate. EEG data were recorded continuously and digitally stored.  Video monitoring was available and reviewed as appropriate.  Description: No clear posterior dominant rhythm was seen. EEG showed continuous generalized 3 to 6 Hz theta-delta slowing. Physiologic photic driving was not seen during photic stimulation. Hyperventilation was not performed.     ABNORMALITY - Continuous slow, generalized  IMPRESSION: This study is suggestive of moderate diffuse encephalopathy. No seizures or epileptiform discharges were seen throughout the recording.  Noni Stonesifer O Heba Ige

## 2024-08-14 NOTE — Consult Note (Signed)
 Reason for Consult:Right knee effusion Referring Physician: Deliliah Room Time called: 1525 Time at bedside: 1540   Blake Atkins is an 83 y.o. male.  HPI: Blake Atkins was brought to the ED yesterday with e/o LE tremors and confusion. In addition he also has right knee pain and it has swollen in the last 24h. Orthopedic surgery was consulted for arthrocentesis. He has a marked expressive aphasia and most of the history is through his wife. No prior e/o similar. Had surgery on that knee as a teenager.  Past Medical History:  Diagnosis Date   Arthritis    Bowen's disease of penis    x2   Carotid artery occlusion    Chronic kidney disease    Dementia (HCC)    Hyperlipidemia    Hypertension     Past Surgical History:  Procedure Laterality Date   APPENDECTOMY     CAROTID ENDARTERECTOMY Right 2009   CEA   COLONOSCOPY     EYE SURGERY Left Jan. 2014   Cataract and Lens implant   EYE SURGERY Right Feb. 2014   Cataract and Lens implant   INGUINAL HERNIA REPAIR Left 05/24/2024   Procedure: REPAIR, HERNIA, INGUINAL, ADULT;  Surgeon: Belinda Cough, MD;  Location: [facility] OR;  Service: General;  Laterality: Left;  LMA TAP BLOCK   KNEE SURGERY     right   PENILE BIOPSY N/A 06/08/2022   Procedure: EXCISION OF PENILE LESION;  Surgeon: Elisabeth Valli BIRCH, MD;  Location: [facility];  Service: Urology;  Laterality: N/A;  1 HR   UMBILICAL HERNIA REPAIR N/A 05/24/2024   Procedure: REPAIR, HERNIA, UMBILICAL, ADULT;  Surgeon: Belinda Cough, MD;  Location: [facility] OR;  Service: General;  Laterality: N/A;  LMA TAP BLOCK    Family History  Problem Relation Age of Onset   Diabetes Maternal Grandmother    Cancer Mother        Lung   Cancer Father        Abdominal    Colon cancer Neg Hx     Social History:  reports that he has never smoked. He has never used smokeless tobacco. He reports that he does not drink alcohol  and does not use drugs.  Allergies: No Known Allergies  Medications: I have reviewed the  patient's current medications.  Results for orders placed or performed during the hospital encounter of 08/13/24 (from the past 48 hours)  Blood Culture (routine x 2)     Status: None (Preliminary result)   Collection Time: 08/13/24  8:20 PM   Specimen: BLOOD LEFT FOREARM  Result Value Ref Range   Specimen Description BLOOD LEFT FOREARM    Special Requests      BOTTLES DRAWN AEROBIC AND ANAEROBIC Blood Culture adequate volume   Culture      NO GROWTH < 12 HOURS Performed at [facility], [address], [place], [place] [address]    Report Status PENDING   Comprehensive metabolic panel     Status: Abnormal   Collection Time: 08/13/24  8:25 PM  Result Value Ref Range   Sodium 138 135 - 145 mmol/L   Potassium 4.2 3.5 - 5.1 mmol/L   Chloride 103 98 - 111 mmol/L   CO2 24 22 - 32 mmol/L   Glucose, Bld 137 (H) 70 - 99 mg/dL    Comment: Glucose reference range applies only to samples taken after fasting for at least 8 hours.   BUN 20 8 - 23 mg/dL   Creatinine, Ser 8.56 (H)  0.61 - 1.24 mg/dL   Calcium  9.4 8.9 - 10.3 mg/dL   Total Protein 7.3 6.5 - 8.1 g/dL   Albumin 3.4 (L) 3.5 - 5.0 g/dL   AST 28 15 - 41 U/L   ALT 28 0 - 44 U/L   Alkaline Phosphatase 92 38 - 126 U/L   Total Bilirubin 1.3 (H) 0.0 - 1.2 mg/dL   GFR, Estimated 49 (L) >60 mL/min    Comment: (NOTE) Calculated using the CKD-EPI Creatinine Equation (2021)    Anion gap 11 5 - 15    Comment: Performed at [facility], [address] [place], [place] [address]  CBC with Differential     Status: Abnormal   Collection Time: 08/13/24  8:25 PM  Result Value Ref Range   WBC 8.7 4.0 - 10.5 K/uL   RBC 4.48 4.22 - 5.81 MIL/uL   Hemoglobin 15.1 13.0 - 17.0 g/dL   HCT 54.5 60.9 - 47.9 %   MCV 101.3 (H) 80.0 - 100.0 fL   MCH 33.7 26.0 - 34.0 pg   MCHC 33.3 30.0 - 36.0 g/dL   RDW 87.5 88.4 - 84.4 %   Platelets 269 150 - 400 K/uL   nRBC 0.0 0.0 - 0.2 %   Neutrophils Relative % 73 %   Neutro Abs 6.4 1.7 - 7.7 K/uL    Lymphocytes Relative 14 %   Lymphs Abs 1.3 0.7 - 4.0 K/uL   Monocytes Relative 11 %   Monocytes Absolute 1.0 0.1 - 1.0 K/uL   Eosinophils Relative 0 %   Eosinophils Absolute 0.0 0.0 - 0.5 K/uL   Basophils Relative 1 %   Basophils Absolute 0.1 0.0 - 0.1 K/uL   Immature Granulocytes 1 %   Abs Immature Granulocytes 0.04 0.00 - 0.07 K/uL    Comment: Performed at [facility], [address] [place], [place] [address]  Protime-INR     Status: None   Collection Time: 08/13/24  8:25 PM  Result Value Ref Range   Prothrombin Time 14.3 11.4 - 15.2 seconds   INR 1.1 0.8 - 1.2    Comment: (NOTE) INR goal varies based on device and disease states. Performed at [facility], [address] [place], [place] [address]   Blood Culture (routine x 2)     Status: None (Preliminary result)   Collection Time: 08/13/24  8:25 PM   Specimen: BLOOD  Result Value Ref Range   Specimen Description BLOOD RIGHT ANTECUBITAL    Special Requests      BOTTLES DRAWN AEROBIC AND ANAEROBIC Blood Culture results may not be optimal due to an inadequate volume of blood received in culture bottles   Culture      NO GROWTH < 12 HOURS Performed at [facility], [address], [place], [place] [address]    Report Status PENDING   Troponin I (High Sensitivity)     Status: None   Collection Time: 08/13/24  8:25 PM  Result Value Ref Range   Troponin I (High Sensitivity) 13 <18 ng/L    Comment: (NOTE) Elevated high sensitivity troponin I (hsTnI) values and significant  changes across serial measurements may suggest ACS but many other  chronic and acute conditions are known to elevate hsTnI results.  Refer to the Links section for chest pain algorithms and additional  guidance. Performed at [facility], [address], [place], [place] [address]   Resp panel by RT-PCR (RSV, Flu A&B, Covid)  Status: None   Collection Time: 08/13/24  9:34 PM   Specimen: Nasal Swab  Result Value Ref  Range   SARS Coronavirus 2 by RT PCR NEGATIVE NEGATIVE   Influenza A by PCR NEGATIVE NEGATIVE   Influenza B by PCR NEGATIVE NEGATIVE    Comment: (NOTE) The Xpert Xpress SARS-CoV-2/FLU/RSV plus assay is intended as an aid in the diagnosis of influenza from Nasopharyngeal swab specimens and should not be used as a sole basis for treatment. Nasal washings and aspirates are unacceptable for Xpert Xpress SARS-CoV-2/FLU/RSV testing.  Fact Sheet for Patients: BloggerCourse.com  Fact Sheet for Healthcare Providers: SeriousBroker.it  This test is not yet approved or cleared by the [place]  FDA and has been authorized for detection and/or diagnosis of SARS-CoV-2 by FDA under an Emergency Use Authorization (EUA). This EUA will remain in effect (meaning this test can be used) for the duration of the COVID-19 declaration under Section 564(b)(1) of the Act, 21 U.S.C. section 360bbb-3(b)(1), unless the authorization is terminated or revoked.     Resp Syncytial Virus by PCR NEGATIVE NEGATIVE    Comment: (NOTE) Fact Sheet for Patients: BloggerCourse.com  Fact Sheet for Healthcare Providers: SeriousBroker.it  This test is not yet approved or cleared by the [place]  FDA and has been authorized for detection and/or diagnosis of SARS-CoV-2 by FDA under an Emergency Use Authorization (EUA). This EUA will remain in effect (meaning this test can be used) for the duration of the COVID-19 declaration under Section 564(b)(1) of the Act, 21 U.S.C. section 360bbb-3(b)(1), unless the authorization is terminated or revoked.  Performed at [facility], [address], [place], [place] [address]   I-Stat Lactic Acid, ED     Status: None   Collection Time: 08/13/24  9:53 PM  Result Value Ref Range   Lactic Acid, Venous 1.1 0.5 - 1.9 mmol/L  Troponin I (High Sensitivity)     Status: None    Collection Time: 08/13/24 11:32 PM  Result Value Ref Range   Troponin I (High Sensitivity) 12 <18 ng/L    Comment: (NOTE) Elevated high sensitivity troponin I (hsTnI) values and significant  changes across serial measurements may suggest ACS but many other  chronic and acute conditions are known to elevate hsTnI results.  Refer to the Links section for chest pain algorithms and additional  guidance. Performed at [facility], [address] [place], [place] [address]   I-Stat Lactic Acid, ED     Status: None   Collection Time: 08/13/24 11:40 PM  Result Value Ref Range   Lactic Acid, Venous 1.5 0.5 - 1.9 mmol/L  Urinalysis, w/ Reflex to Culture (Infection Suspected) -Urine, Clean Catch     Status: Abnormal   Collection Time: 08/14/24  1:42 AM  Result Value Ref Range   Specimen Source URINE, CLEAN CATCH    Color, Urine YELLOW YELLOW   APPearance CLEAR CLEAR   Specific Gravity, Urine 1.026 1.005 - 1.030   pH 6.0 5.0 - 8.0   Glucose, UA NEGATIVE NEGATIVE mg/dL   Hgb urine dipstick NEGATIVE NEGATIVE   Bilirubin Urine NEGATIVE NEGATIVE   Ketones, ur 5 (A) NEGATIVE mg/dL   Protein, ur NEGATIVE NEGATIVE mg/dL   Nitrite NEGATIVE NEGATIVE   Leukocytes,Ua NEGATIVE NEGATIVE   RBC / HPF 6-10 0 - 5 RBC/hpf   WBC, UA 0-5 0 - 5 WBC/hpf    Comment:        Reflex urine culture not performed if WBC <=10, OR if  Squamous epithelial cells >5. If Squamous epithelial cells >5 suggest recollection.    Bacteria, UA NONE SEEN NONE SEEN   Squamous Epithelial / HPF 0-5 0 - 5 /HPF   Mucus PRESENT     Comment: Performed at [facility], [address], [place], [place] [address]  Comprehensive metabolic panel     Status: Abnormal   Collection Time: 08/14/24  5:50 AM  Result Value Ref Range   Sodium 139 135 - 145 mmol/L   Potassium 3.9 3.5 - 5.1 mmol/L   Chloride 108 98 - 111 mmol/L   CO2 21 (L) 22 - 32 mmol/L   Glucose, Bld 105 (H) 70 - 99 mg/dL    Comment: Glucose reference range  applies only to samples taken after fasting for at least 8 hours.   BUN 18 8 - 23 mg/dL   Creatinine, Ser 8.75 0.61 - 1.24 mg/dL   Calcium  9.2 8.9 - 10.3 mg/dL   Total Protein 6.8 6.5 - 8.1 g/dL   Albumin 3.0 (L) 3.5 - 5.0 g/dL   AST 28 15 - 41 U/L   ALT 26 0 - 44 U/L   Alkaline Phosphatase 81 38 - 126 U/L   Total Bilirubin 1.5 (H) 0.0 - 1.2 mg/dL   GFR, Estimated 58 (L) >60 mL/min    Comment: (NOTE) Calculated using the CKD-EPI Creatinine Equation (2021)    Anion gap 10 5 - 15    Comment: Performed at [facility], [address], [place], [place] [address]  CBC     Status: Abnormal   Collection Time: 08/14/24  5:50 AM  Result Value Ref Range   WBC 8.2 4.0 - 10.5 K/uL   RBC 4.18 (L) 4.22 - 5.81 MIL/uL   Hemoglobin 14.4 13.0 - 17.0 g/dL   HCT 58.3 60.9 - 47.9 %   MCV 99.5 80.0 - 100.0 fL   MCH 34.4 (H) 26.0 - 34.0 pg   MCHC 34.6 30.0 - 36.0 g/dL   RDW 87.5 88.4 - 84.4 %   Platelets 232 150 - 400 K/uL   nRBC 0.0 0.0 - 0.2 %    Comment: Performed at [facility], [address], [place], [place] [address]    ECHOCARDIOGRAM COMPLETE Result Date: 08/14/2024    ECHOCARDIOGRAM REPORT   Patient Name:   Ismar Ginzburg Date of Exam: 08/14/2024 Medical Rec #:  [id number]      Height:       69.0 in Accession #:    [id number]     Weight:       190.7 lb Date of Birth:  12/05/1941       BSA:          2.025 m Patient Age:    82 years       BP:           128/78 mmHg Patient Gender: M              HR:           60 bpm. Exam Location:  Inpatient Procedure: 2D Echo, Cardiac Doppler and Color Doppler (Both Spectral and Color            Flow Doppler were utilized during procedure). Indications:    R55 Syncope  History:        Patient has no prior history of Echocardiogram examinations.                 Risk Factors:Hypertension and Dyslipidemia.  Sonographer:    Damien Senior RDCS Referring Phys: [id number] SUBRINA SUNDIL  Sonographer Comments: Some pictures limited by patient movement, patient is  pleasantly confused throughout exam IMPRESSIONS  1. Left ventricular ejection fraction, by estimation, is 65 to 70%. The left ventricle has normal function. The left ventricle has no regional wall motion abnormalities. Left ventricular diastolic parameters were normal.  2. Right ventricular systolic function is normal. The right ventricular size is normal. Tricuspid regurgitation signal is inadequate for assessing PA pressure.  3. Left atrial size was moderately dilated.  4. Right atrial size was moderately dilated.  5. The mitral valve is normal in structure. Trivial mitral valve regurgitation. No evidence of mitral stenosis.  6. The aortic valve is tricuspid. Aortic valve regurgitation is not visualized. No aortic stenosis is present.  7. There is mild dilatation of the ascending aorta, measuring 39 mm.  8. The inferior vena cava is normal in size with greater than 50% respiratory variability, suggesting right atrial pressure of 3 mmHg. FINDINGS  Left Ventricle: Left ventricular ejection fraction, by estimation, is 65 to 70%. The left ventricle has normal function. The left ventricle has no regional wall motion abnormalities. The left ventricular internal cavity size was normal in size. There is  borderline asymmetric left ventricular hypertrophy of the septal segment. Left ventricular diastolic parameters were normal. Right Ventricle: The right ventricular size is normal. No increase in right ventricular wall thickness. Right ventricular systolic function is normal. Tricuspid regurgitation signal is inadequate for assessing PA pressure. Left Atrium: Left atrial size was moderately dilated. Right Atrium: Right atrial size was moderately dilated. Pericardium: There is no evidence of pericardial effusion. Mitral Valve: The mitral valve is normal in structure. Trivial mitral valve regurgitation. No evidence of mitral valve stenosis. Tricuspid Valve: The tricuspid valve is grossly normal. Tricuspid valve regurgitation  is trivial. No evidence of tricuspid stenosis. Aortic Valve: The aortic valve is tricuspid. Aortic valve regurgitation is not visualized. No aortic stenosis is present. Pulmonic Valve: The pulmonic valve was grossly normal. Pulmonic valve regurgitation is not visualized. No evidence of pulmonic stenosis. Aorta: The aortic root is normal in size and structure. Ascending aorta measurements are within normal limits for age when indexed to body surface area. There is mild dilatation of the ascending aorta, measuring 39 mm. Venous: The inferior vena cava is normal in size with greater than 50% respiratory variability, suggesting right atrial pressure of 3 mmHg. IAS/Shunts: The atrial septum is grossly normal.  LEFT VENTRICLE PLAX 2D LVIDd:         3.70 cm   Diastology LVIDs:         2.40 cm   LV e' medial:    7.46 cm/s LV PW:         1.00 cm   LV E/e' medial:  12.3 LV IVS:        1.10 cm   LV e' lateral:   11.70 cm/s LVOT diam:     2.40 cm   LV E/e' lateral: 7.8 LV SV:         95 LV SV Index:   47 LVOT Area:     4.52 cm  RIGHT VENTRICLE RV S prime:     15.20 cm/s TAPSE (M-mode): 2.5 cm LEFT ATRIUM             Index        RIGHT ATRIUM           Index LA diam:  3.70 cm 1.83 cm/m   RA Area:     25.20 cm LA Vol (A2C):   62.0 ml 30.62 ml/m  RA Volume:   76.10 ml  37.59 ml/m LA Vol (A4C):   91.6 ml 45.24 ml/m LA Biplane Vol: 78.3 ml 38.67 ml/m  AORTIC VALVE LVOT Vmax:   96.00 cm/s LVOT Vmean:  68.200 cm/s LVOT VTI:    0.210 m  AORTA Ao Root diam: 3.40 cm Ao Asc diam:  3.90 cm MITRAL VALVE MV Area (PHT): 3.65 cm    SHUNTS MV Decel Time: 208 msec    Systemic VTI:  0.21 m MV E velocity: 91.40 cm/s  Systemic Diam: 2.40 cm MV A velocity: 76.40 cm/s MV E/A ratio:  1.20 Mihai Croitoru MD Electronically signed by Jerel Balding MD Signature Date/Time: 08/14/2024/3:18:31 PM    Final    EEG adult Result Date: 08/14/2024 Shelton Arlin KIDD, MD     08/14/2024  8:35 AM Patient Name: Blake Atkins MRN: [id number] Epilepsy  Attending: Arlin KIDD Shelton Referring Physician/Provider: Sundil, Subrina, MD Date: 08/14/2024 Duration: 21.58 mins Patient history: 83 y.o. male with history of dementia, expressive aphasia, essential tremor, HTN, bilateral carotid stenosis (s/p R CEA 2009), CKD III who presents after a transient episode of inability to follow instructions, bowel incontinence. EEG to evaluate for seizure Level of alertness: Awake AEDs during EEG study: VPA Technical aspects: This EEG study was done with scalp electrodes positioned according to the 10-20 International system of electrode placement. Electrical activity was reviewed with band pass filter of 1-70Hz , sensitivity of 7 uV/mm, display speed of 18mm/sec with a 60Hz  notched filter applied as appropriate. EEG data were recorded continuously and digitally stored.  Video monitoring was available and reviewed as appropriate. Description: No clear posterior dominant rhythm was seen. EEG showed continuous generalized 3 to 6 Hz theta-delta slowing. Physiologic photic driving was not seen during photic stimulation. Hyperventilation was not performed.   ABNORMALITY - Continuous slow, generalized IMPRESSION: This study is suggestive of moderate diffuse encephalopathy. No seizures or epileptiform discharges were seen throughout the recording. Arlin KIDD Shelton   MR BRAIN WO CONTRAST Result Date: 08/14/2024 CLINICAL DATA:  Neuro deficit, acute, stroke suspected EXAM: MRI HEAD WITHOUT CONTRAST TECHNIQUE: Multiplanar, multiecho pulse sequences of the brain and surrounding structures were obtained without intravenous contrast. COMPARISON:  CT head from earlier today.  MRI head June 3, 21. FINDINGS: Due to patient intolerance, incomplete exam. Only DWI/ADC and sagittal T1 were performed. No evidence of acute infarct. No obvious evidence of acute hemorrhage, mass lesion, midline shift or hydrocephalus. IMPRESSION: Incomplete exam without evidence of acute infarct or other obvious acute  abnormality. Electronically Signed   By: Gilmore GORMAN Molt M.D.   On: 08/14/2024 00:58   CT Head Wo Contrast Result Date: 08/13/2024 CLINICAL DATA:  Mental status changes, unknown cause. EXAM: CT HEAD WITHOUT CONTRAST TECHNIQUE: Contiguous axial images were obtained from the base of the skull through the vertex without intravenous contrast. RADIATION DOSE REDUCTION: This exam was performed according to the departmental dose-optimization program which includes automated exposure control, adjustment of the mA and/or kV according to patient size and/or use of iterative reconstruction technique. COMPARISON:  MRI brain 05/29/2020. FINDINGS: Brain: There is moderately advanced cerebral atrophy, small-vessel disease and atrophic ventriculomegaly, with unremarkable cerebellum and brainstem. There is a partially empty sella. No cortical based acute infarct, hemorrhage, mass or mass effect is seen with CT. There is no midline shift. Basal cisterns are clear. Vascular: There are calcific plaques  in both distal vertebral arteries and both siphons. No hyperdense central vessel is seen. Skull: Negative for fractures or focal lesions. Sinuses/Orbits: There is mild membrane thickening in the maxillary sinuses. Other paranasal sinuses, bilateral mastoid air cells, and middle ears are clear. The nasal septum is midline. Old lens replacements noted with otherwise negative orbits. Other: None. IMPRESSION: 1. No acute intracranial CT findings. 2. Atrophy and small-vessel disease. Slightly interval progressed since 2021. 3. Sinus membrane disease. Electronically Signed   By: Francis Quam M.D.   On: 08/13/2024 22:16   DG Chest Port 1 View Result Date: 08/13/2024 CLINICAL DATA:  Questionable sepsis - evaluate for abnormality EXAM: PORTABLE CHEST 1 VIEW COMPARISON:  None Available. FINDINGS: The heart and mediastinal contours are within normal limits. No focal consolidation. No pulmonary edema. No pleural effusion. No pneumothorax.  No acute osseous abnormality. IMPRESSION: No active disease. Electronically Signed   By: Morgane  Naveau M.D.   On: 08/13/2024 22:12    Review of Systems  Unable to perform ROS: Patient nonverbal   Blood pressure 128/78, pulse (!) 53, temperature 98.1 F (36.7 C), temperature source Oral, resp. rate 17, weight 86.5 kg, SpO2 97%. Physical Exam Constitutional:      General: He is not in acute distress.    Appearance: He is well-developed. He is not diaphoretic.  HENT:     Head: Normocephalic and atraumatic.  Eyes:     General: No scleral icterus.       Right eye: No discharge.        Left eye: No discharge.     Conjunctiva/sclera: Conjunctivae normal.  Cardiovascular:     Rate and Rhythm: Normal rate and regular rhythm.  Pulmonary:     Effort: Pulmonary effort is normal. No respiratory distress.  Musculoskeletal:     Cervical back: Normal range of motion.     Comments: RLE No traumatic wounds, ecchymosis, or rash  Mod TTP knee, pain with A/PROM  Mod knee effusioin  Knee stable to varus/ valgus and anterior/posterior stress  Sens DPN, SPN, TN intact  Motor EHL, ext, flex, evers 5/5  DP 2+, PT 2+, No significant edema  Skin:    General: Skin is warm and dry.  Neurological:     Mental Status: He is alert.  Psychiatric:        Mood and Affect: Mood normal.        Behavior: Behavior normal.     Assessment/Plan: Right knee effusion -- Knee tapped and sent for analysis    Ozell DOROTHA Ned, PA-C Orthopedic Surgery [phone] 08/14/2024, 4:10 PM

## 2024-08-14 NOTE — Progress Notes (Signed)
 EEG complete, no initial skin breakdown

## 2024-08-15 DIAGNOSIS — R4182 Altered mental status, unspecified: Secondary | ICD-10-CM | POA: Diagnosis not present

## 2024-08-15 LAB — BASIC METABOLIC PANEL WITH GFR
Anion gap: 9 (ref 5–15)
BUN: 18 mg/dL (ref 8–23)
CO2: 18 mmol/L — ABNORMAL LOW (ref 22–32)
Calcium: 8.6 mg/dL — ABNORMAL LOW (ref 8.9–10.3)
Chloride: 106 mmol/L (ref 98–111)
Creatinine, Ser: 1.19 mg/dL (ref 0.61–1.24)
GFR, Estimated: >60 mL/min (ref >60)
Glucose, Bld: 124 mg/dL — ABNORMAL HIGH (ref 70–99)
Potassium: 3.9 mmol/L (ref 3.5–5.1)
Sodium: 133 mmol/L — ABNORMAL LOW (ref 135–145)

## 2024-08-15 LAB — CBC
HCT: 42.8 % (ref 39.0–52.0)
Hemoglobin: 14.6 g/dL (ref 13.0–17.0)
MCH: 33.3 pg (ref 26.0–34.0)
MCHC: 34.1 g/dL (ref 30.0–36.0)
MCV: 97.5 fL (ref 80.0–100.0)
Platelets: 278 K/uL (ref 150–400)
RBC: 4.39 MIL/uL (ref 4.22–5.81)
RDW: 12.1 % (ref 11.5–15.5)
WBC: 9.3 K/uL (ref 4.0–10.5)
nRBC: 0 % (ref 0.0–0.2)

## 2024-08-15 LAB — PROCALCITONIN: Procalcitonin: <0.1 ng/mL

## 2024-08-15 LAB — C-REACTIVE PROTEIN: CRP: 12 mg/dL — ABNORMAL HIGH (ref <1.0)

## 2024-08-15 MED ORDER — GUAIFENESIN-DM 100-10 MG/5ML PO SYRP
5.0000 mL | ORAL_SOLUTION | ORAL | Status: DC | PRN
  Administered 2024-08-15 – 2024-08-16 (×2): 5 mL via ORAL
  Filled 2024-08-15 (×2): qty 5

## 2024-08-15 MED ORDER — ENSURE PLUS HIGH PROTEIN PO LIQD
237.0000 mL | Freq: Two times a day (BID) | ORAL | Status: DC
  Administered 2024-08-16: 237 mL via ORAL

## 2024-08-15 NOTE — Plan of Care (Signed)

## 2024-08-15 NOTE — Evaluation (Signed)
 Occupational Therapy Evaluation Patient Details Name: Blake Atkins MRN: [id number] DOB: 07-14-1941 Today's Date: 08/15/2024   History of Present Illness   Pt is an 83 y.o. male presenting 8/18 after fall from EOB, bowel incontinence, confusion, BLE tremor. PMH significant for dementia complicated by expressive aphasia, HTN, CVA, HLD, CKD III.     Clinical Impressions PTA, pt lived with wife and received assist for dressing, bathing, IADL, and transportation. Does his own grooming and mows the lawn at baseline. Upon eval, pt presents with decreased strength, balance, cognition, and BUE tremor. Pt needing up to min A for UB ADL and mod A for LB ADL. Pt to continue to benefit from acute OT services. Due to significant decline in functional status, and pt with good support, recommending intensive multidisciplinary rehabilitation >3 hours/day to optimize safety and independence in ADL.        If plan is discharge home, recommend the following:   A lot of help with walking and/or transfers;A lot of help with bathing/dressing/bathroom;Two people to help with bathing/dressing/bathroom;Assist for transportation;Help with stairs or ramp for entrance     Functional Status Assessment   Patient has had a recent decline in their functional status and demonstrates the ability to make significant improvements in function in a reasonable and predictable amount of time.     Equipment Recommendations   Other (comment) (defer)     Recommendations for Other Services   Rehab consult     Precautions/Restrictions   Precautions Precautions: Fall Restrictions Weight Bearing Restrictions Per Provider Order: No     Mobility Bed Mobility Overal bed mobility: Needs Assistance Bed Mobility: Supine to Sit, Sit to Supine     Supine to sit: Contact guard Sit to supine: Contact guard assist   General bed mobility comments: no physical assistance provided however pt requiring Mod cues.     Transfers Overall transfer level: Needs assistance Equipment used: 2 person hand held assist Transfers: Sit to/from Stand Sit to Stand: +2 physical assistance, Min assist           General transfer comment: +2 Min A to power up.      Balance Overall balance assessment: Needs assistance Sitting-balance support: Single extremity supported, Feet supported, No upper extremity supported Sitting balance-Leahy Scale: Fair Sitting balance - Comments: Pt would occasionally lean posteriorly when seated EOB.   Standing balance support: Bilateral upper extremity supported, During functional activity Standing balance-Leahy Scale: Poor Standing balance comment: Reliant on HHA                           ADL either performed or assessed with clinical judgement   ADL Overall ADL's : Needs assistance/impaired Eating/Feeding: Minimal assistance;Sitting   Grooming: Minimal assistance;Standing   Upper Body Bathing: Set up;Sitting   Lower Body Bathing: Moderate assistance;Sit to/from stand   Upper Body Dressing : Sitting;Minimal assistance   Lower Body Dressing: Moderate assistance;Sit to/from stand   Toilet Transfer: Minimal assistance;+2 for physical assistance;+2 for safety/equipment;Ambulation           Functional mobility during ADLs: Minimal assistance;+2 for physical assistance;+2 for safety/equipment       Vision Patient Visual Report: No change from baseline Vision Assessment?: No apparent visual deficits     Perception         Praxis         Pertinent Vitals/Pain Pain Assessment Pain Assessment: Faces Faces Pain Scale: No hurt Pain Intervention(s): Monitored during session  Extremity/Trunk Assessment Upper Extremity Assessment Upper Extremity Assessment: Generalized weakness (BUE tremor not present at baseline)   Lower Extremity Assessment Lower Extremity Assessment: Defer to PT evaluation   Cervical / Trunk Assessment Cervical / Trunk  Assessment: Normal   Communication Communication Communication: Impaired Factors Affecting Communication: Difficulty expressing self (receptive difficulties at times if not given direct simple commands)   Cognition Arousal: Alert Behavior During Therapy: WFL for tasks assessed/performed, Impulsive Cognition: History of cognitive impairments             OT - Cognition Comments: wife believes cognition is near bsaeline, however, pt needing cues for safety with mobility, and pt typically mobilizes without A                 Following commands: Impaired Following commands impaired: Follows one step commands with increased time, Follows one step commands inconsistently (needs direct simple commands.)     Cueing  General Comments   Cueing Techniques: Verbal cues;Tactile cues;Gestural cues  VSS   Exercises     Shoulder Instructions      Home Living Family/patient expects to be discharged to:: Private residence Living Arrangements: Spouse/significant other Available Help at Discharge: Family;Available 24 hours/day Type of Home: House Home Access: Stairs to enter [employer] of Steps: 4 STE back bil rails, cannot reach both; 6 STE at garage bil rails can reach both Entrance Stairs-Rails: Right;Left Home Layout: One level     Bathroom Shower/Tub: Producer, television/film/video: Handicapped height     Home Equipment: Engineer, materials - single point;Grab bars - tub/shower          Prior Functioning/Environment Prior Level of Function : Needs assist             Mobility Comments: no AD ADLs Comments: wife assist with bathing/dressing. pt normally able to groom self standing at sink, able to mow the yard. pt does not drive    OT Problem List: Decreased strength;Decreased activity tolerance;Impaired balance (sitting and/or standing);Decreased cognition;Decreased safety awareness;Impaired UE functional use   OT Treatment/Interventions:  Self-care/ADL training;Therapeutic exercise;DME and/or AE instruction;Therapeutic activities;Patient/family education;Balance training      OT Goals(Current goals can be found in the care plan section)   Acute Rehab OT Goals Patient Stated Goal: get better OT Goal Formulation: With patient/family Time For Goal Achievement: 08/29/24 Potential to Achieve Goals: Good   OT Frequency:  Min 2X/week    Co-evaluation PT/OT/SLP Co-Evaluation/Treatment: Yes Reason for Co-Treatment: Complexity of the patient's impairments (multi-system involvement);For patient/therapist safety;To address functional/ADL transfers;Necessary to address cognition/behavior during functional activity PT goals addressed during session: Mobility/safety with mobility OT goals addressed during session: ADL's and self-care;Strengthening/ROM      AM-PAC OT 6 Clicks Daily Activity     Outcome Measure Help from another person eating meals?: A Little Help from another person taking care of personal grooming?: A Little Help from another person toileting, which includes using toliet, bedpan, or urinal?: A Lot Help from another person bathing (including washing, rinsing, drying)?: A Lot Help from another person to put on and taking off regular upper body clothing?: A Little Help from another person to put on and taking off regular lower body clothing?: A Lot 6 Click Score: 15   End of Session Equipment Utilized During Treatment: Gait belt Nurse Communication: Mobility status  Activity Tolerance: Patient tolerated treatment well Patient left: in bed;with call bell/phone within reach;with bed alarm set  OT Visit Diagnosis: Unsteadiness on feet (R26.81);Muscle weakness (generalized) (M62.81);Other symptoms and  signs involving cognitive function                Time: 9062-8990 OT Time Calculation (min): 32 min Charges:  OT General Charges $OT Visit: 1 Visit OT Evaluation $OT Eval Low Complexity: 1 Low  Elma JONETTA Lebron FREDERICK, OTR/L [facility] Acute Rehabilitation Office: [phone]   Elma JONETTA Lebron 08/15/2024, 12:39 PM

## 2024-08-15 NOTE — Progress Notes (Signed)
 Family requests external cath remain despite education.

## 2024-08-15 NOTE — TOC Progression Note (Signed)
 Transition of Care [facility]) - Progression Note    Patient Details  Name: Blake Atkins MRN: [id number] Date of Birth: 01-18-1941  Transition of [facility]) CM/SW Contact  Marval Gell, RN Phone Number: 08/15/2024, 2:59 PM  Clinical Narrative:     Ordered BSC and WC through [employer] to be delivered to the room  Expected Discharge Plan: Home w Home Health Services Barriers to Discharge: Continued Medical Work up               Expected Discharge Plan and Services     Post Acute Care Choice: Home Health Living arrangements for the past 2 months: Single Family Home                 DME Arranged: Bedside commode, Lightweight manual wheelchair with seat cushion DME Agency: [employer] Date DME Agency Contacted: 08/15/24 Time DME Agency Contacted: [id number] Representative spoke with at DME Agency: [place]             Social Drivers of Health (SDOH) Interventions SDOH Screenings   Food Insecurity: No Food Insecurity (08/14/2024)  Housing: Low Risk  (08/14/2024)  Transportation Needs: No Transportation Needs (08/14/2024)  Utilities: Not At Risk (08/14/2024)  Social Connections: Socially Integrated (08/14/2024)  Tobacco Use: Low Risk  (08/14/2024)    Readmission Risk Interventions     No data to display

## 2024-08-15 NOTE — Progress Notes (Signed)
 PROGRESS NOTE    Blake Atkins  FMW:[id number] DOB: Mar 03, 1941 DOA: 08/13/2024 PCP: Blake Atkins    Chief Complaint  Patient presents with   Fall   Weakness    Brief Narrative:    83 y.o. male with medical history significant of dementia complicated by expressive aphasia, essential hypertension, CVA, hyperlipidemia and CKD stage III presented emergency department transient episodes of lower extremity tremulousness, bowel incontinence and confusion. CT head and MRI brain negative for stroke. No epileptiform activities on EEG. He had swollen and tender Right knee and lower leg so a CT has been ordered.   Assessment & Plan:   Principal Problem:   New onset seizure (HCC) Active Problems:   Altered mental status   History of dementia   History of expressive aphasia   Essential hypertension   CKD stage 3b, GFR 30-44 ml/min (HCC)   Syncope   Acute metabolic encephalopathy,POA: No evidence of acute stroke. Patient has expressive aphasia at baseline No fever or neck stiffness to suggest meningitis No growth on blood cultures He does have right knee tenderness, warmth and swelling. An  Neurology on board.  Devious Atkins discussed with Dr Michaela on 8/19 and depakote  will be discontinued. No evidence of epileptiform activity on the EEG. It does show moderate diffuse encephalopathy.  -Patient much improved, back at baseline as discussed with wife at bedside   Right knee inflammatory arthritis  Acute gout arthritis  -H/o prior right knee replacement -Was negative for DVT -Therapeutic consult greatly appreciated, status post arthrocentesis, workup significant for inflammatory arthritis, secondary to gout/pseudogout showing both urate and phosphate crystals -Continue with colchicine     Alzheimer's dementia,POA: Continue with memantine   Essential Hypertension: Takes Olmesartan at home. BP has been up and down here in the hospital.   CKD 3A: continue hydration    Disposition: Lives at home with his wife. Will consult PT OT.       DVT prophylaxis: Heparin  Code Status: Full Family Communication: Cussed with wife at bedside Disposition:   Status is: Inpatient    Consultants:  Orthopedic  Procedures:  - Arthrocentesis   Subjective:  Wife report he had a good night sleep, less confused and less delirious  Objective: Vitals:   08/14/24 1759 08/15/24 0000 08/15/24 0100 08/15/24 0800  BP: (!) 180/92   121/83  Pulse:    (!) 59  Resp:    18  Temp: 98.2 F (36.8 C) 100.1 F (37.8 C) 100.1 F (37.8 C) 98.2 F (36.8 C)  TempSrc: Oral Oral Oral Oral  SpO2:    98%  Weight: 86.5 kg     Height: 5' 9 (1.753 m)       Intake/Output Summary (Last 24 hours) at 08/15/2024 1131 Last data filed at 08/15/2024 0821 Gross per 24 hour  Intake 971.43 ml  Output 800 ml  Net 171.43 ml   Filed Weights   08/14/24 0200 08/14/24 1759  Weight: 86.5 kg 86.5 kg    Examination:  Sleeping comfortably, expressive aphasia at baseline Symmetrical Chest wall movement, Good air movement bilaterally, CTAB RRR,No Gallops,Rubs or new Murmurs, No Parasternal Heave +ve B.Sounds, Abd Soft, No tenderness, No rebound - guarding or rigidity. Right lower extremity edema at baseline   Data Reviewed: I have personally reviewed following labs and imaging studies  CBC: Recent Labs  Lab 08/13/24 2025 08/14/24 0550 08/15/24 1009  WBC 8.7 8.2 9.3  NEUTROABS 6.4  --   --   HGB 15.1 14.4 14.6  HCT 45.4 41.6 42.8  MCV 101.3* 99.5 97.5  PLT 269 232 278    Basic Metabolic Panel: Recent Labs  Lab 08/13/24 2025 08/14/24 0550  NA 138 139  K 4.2 3.9  CL 103 108  CO2 24 21*  GLUCOSE 137* 105*  BUN 20 18  CREATININE 1.43* 1.24  CALCIUM  9.4 9.2    GFR: Estimated Creatinine Clearance: 50.9 mL/min (by C-G formula based on SCr of 1.24 mg/dL).  Liver Function Tests: Recent Labs  Lab 08/13/24 2025 08/14/24 0550  AST 28 28  ALT 28 26  ALKPHOS 92 81   BILITOT 1.3* 1.5*  PROT 7.3 6.8  ALBUMIN 3.4* 3.0*    CBG: No results for input(s): GLUCAP in the last 168 hours.   Recent Results (from the past 240 hours)  Blood Culture (routine x 2)     Status: None (Preliminary result)   Collection Time: 08/13/24  8:20 PM   Specimen: BLOOD LEFT FOREARM  Result Value Ref Range Status   Specimen Description BLOOD LEFT FOREARM  Final   Special Requests   Final    BOTTLES DRAWN AEROBIC AND ANAEROBIC Blood Culture adequate volume   Culture   Final    NO GROWTH 2 DAYS Performed at [facility], [address] [place], [place] [address]    Report Status PENDING  Incomplete  Blood Culture (routine x 2)     Status: None (Preliminary result)   Collection Time: 08/13/24  8:25 PM   Specimen: BLOOD  Result Value Ref Range Status   Specimen Description BLOOD RIGHT ANTECUBITAL  Final   Special Requests   Final    BOTTLES DRAWN AEROBIC AND ANAEROBIC Blood Culture results may not be optimal due to an inadequate volume of blood received in culture bottles   Culture   Final    NO GROWTH 2 DAYS Performed at [facility], [address] [place], [place] [address]    Report Status PENDING  Incomplete  Resp panel by RT-PCR (RSV, Flu A&B, Covid)     Status: None   Collection Time: 08/13/24  9:34 PM   Specimen: Nasal Swab  Result Value Ref Range Status   SARS Coronavirus 2 by RT PCR NEGATIVE NEGATIVE Final   Influenza A by PCR NEGATIVE NEGATIVE Final   Influenza B by PCR NEGATIVE NEGATIVE Final    Comment: (NOTE) The Xpert Xpress SARS-CoV-2/FLU/RSV plus assay is intended as an aid in the diagnosis of influenza from Nasopharyngeal swab specimens and should not be used as a sole basis for treatment. Nasal washings and aspirates are unacceptable for Xpert Xpress SARS-CoV-2/FLU/RSV testing.  Fact Sheet for Patients: BloggerCourse.com  Fact Sheet for Healthcare  Providers: SeriousBroker.it  This test is not yet approved or cleared by the [place]  FDA and has been authorized for detection and/or diagnosis of SARS-CoV-2 by FDA under an Emergency Use Authorization (EUA). This EUA will remain in effect (meaning this test can be used) for the duration of the COVID-19 declaration under Section 564(b)(1) of the Act, 21 U.S.C. section 360bbb-3(b)(1), unless the authorization is terminated or revoked.     Resp Syncytial Virus by PCR NEGATIVE NEGATIVE Final    Comment: (NOTE) Fact Sheet for Patients: BloggerCourse.com  Fact Sheet for Healthcare Providers: SeriousBroker.it  This test is not yet approved or cleared by the [place]  FDA and has been authorized for detection and/or diagnosis of SARS-CoV-2 by FDA under an Emergency Use Authorization (EUA). This EUA will remain in  effect (meaning this test can be used) for the duration of the COVID-19 declaration under Section 564(b)(1) of the Act, 21 U.S.C. section 360bbb-3(b)(1), unless the authorization is terminated or revoked.  Performed at [facility], [address], [place], [place] [address]   Body fluid culture w Gram Stain     Status: None (Preliminary result)   Collection Time: 08/14/24  4:12 PM   Specimen: Synovium; Body Fluid  Result Value Ref Range Status   Specimen Description SYNOVIAL RIGHT KNEE  Final   Special Requests NONE  Final   Gram Stain   Final    FEW WBC PRESENT, PREDOMINANTLY PMN NO ORGANISMS SEEN    Culture   Final    NO GROWTH < 24 HOURS Performed at [facility], [address] [place], [place] [address]    Report Status PENDING  Incomplete         Radiology Studies: VAS US  LOWER EXTREMITY VENOUS (DVT) Result Date: 08/14/2024  Lower Venous DVT Study Patient Name:  Talor Strout  Date of Exam:   08/14/2024 Medical Rec #: [id number]       Accession #:     [id number] Date of Birth: [birth year]        Patient Gender: M Patient Age:   8 years Exam Location:  [facility] Procedure:      VAS US  LOWER EXTREMITY VENOUS (DVT) Referring Phys: DELILIAH RASHID --------------------------------------------------------------------------------  Indications: Swelling, and Edema.  Limitations: Uncontrollably shaking, pt hard of hearing and unable to follow commands for optiomal imaging. Performing Technologist: Elmarie Lindau, RVT  Examination Guidelines: A complete evaluation includes B-mode imaging, spectral Doppler, color Doppler, and power Doppler as needed of all accessible portions of each vessel. Bilateral testing is considered an integral part of a complete examination. Limited examinations for reoccurring indications may be performed as noted. The reflux portion of the exam is performed with the patient in reverse Trendelenburg.  +---------+---------------+---------+-----------+----------+--------------+ RIGHT    CompressibilityPhasicitySpontaneityPropertiesThrombus Aging +---------+---------------+---------+-----------+----------+--------------+ CFV      Full           Yes      Yes                                 +---------+---------------+---------+-----------+----------+--------------+ SFJ      Full                                                        +---------+---------------+---------+-----------+----------+--------------+ FV Prox  Full                                                        +---------+---------------+---------+-----------+----------+--------------+ FV Mid   Full                                                        +---------+---------------+---------+-----------+----------+--------------+ FV DistalFull                                                        +---------+---------------+---------+-----------+----------+--------------+  PFV      Full                                                         +---------+---------------+---------+-----------+----------+--------------+ POP      Full           Yes      Yes                                 +---------+---------------+---------+-----------+----------+--------------+ PTV      Full                                                        +---------+---------------+---------+-----------+----------+--------------+ PERO     Full                                                        +---------+---------------+---------+-----------+----------+--------------+     Summary: RIGHT: - There is no evidence of deep vein thrombosis in the lower extremity.  - No cystic structure found in the popliteal fossa.   *See table(s) above for measurements and observations. Electronically signed by Penne Colorado Atkins on 08/14/2024 at 6:04:22 PM.    Final    CT KNEE RIGHT WO CONTRAST Result Date: 08/14/2024 CLINICAL DATA:  Right knee swelling and tenderness. EXAM: CT OF THE RIGHT KNEE WITHOUT CONTRAST TECHNIQUE: Multidetector CT imaging of the right knee was performed according to the standard protocol. Multiplanar CT image reconstructions were also generated. RADIATION DOSE REDUCTION: This exam was performed according to the departmental dose-optimization program which includes automated exposure control, adjustment of the mA and/or kV according to patient size and/or use of iterative reconstruction technique. COMPARISON:  None Available. FINDINGS: Evaluation is somewhat limited due to motion artifact. Bones/Joint/Cartilage No acute fracture identified. No dislocation. Moderate to advanced tricompartmental degenerative arthropathy manifested by tricompartmental joint space narrowing and osteophytosis. There is a moderate sized knee joint effusion with foci of mineralization within the medial and lateral femorotibial compartments and joint space. No definite erosive changes, although evaluation is limited due to motion degradation. Ligaments Ligaments are  suboptimally evaluated by CT. Muscles and Tendons Muscles are normal. No muscle atrophy. No intramuscular fluid collection or hematoma. Quadriceps and patellar tendons are grossly intact. Soft tissue Mild prepatellar soft tissue swelling. No loculated fluid collection. No discrete soft tissue mass. IMPRESSION: 1. No acute fracture identified. 2. Moderate to advanced tricompartmental degenerative arthropathy of the knee with moderate-sized knee joint effusion and foci of mineralization, as can be seen in the setting of CPPD arthropathy or gout. No definite erosive changes identified, although evaluation is limited due to motion degradation. Electronically Signed   By: Harrietta Sherry M.D.   On: 08/14/2024 17:04   ECHOCARDIOGRAM COMPLETE Result Date: 08/14/2024    ECHOCARDIOGRAM REPORT   Patient Name:   Adonte Berroa Date of Exam: 08/14/2024 Medical Rec #:  [id number]      Height:  69.0 in Accession #:    [id number]     Weight:       190.7 lb Date of Birth:  Jun 20, 1941       BSA:          2.025 m Patient Age:    82 years       BP:           128/78 mmHg Patient Gender: M              HR:           60 bpm. Exam Location:  Inpatient Procedure: 2D Echo, Cardiac Doppler and Color Doppler (Both Spectral and Color            Flow Doppler were utilized during procedure). Indications:    R55 Syncope  History:        Patient has no prior history of Echocardiogram examinations.                 Risk Factors:Hypertension and Dyslipidemia.  Sonographer:    Damien Senior RDCS Referring Phys: [id number] SUBRINA SUNDIL  Sonographer Comments: Some pictures limited by patient movement, patient is pleasantly confused throughout exam IMPRESSIONS  1. Left ventricular ejection fraction, by estimation, is 65 to 70%. The left ventricle has normal function. The left ventricle has no regional wall motion abnormalities. Left ventricular diastolic parameters were normal.  2. Right ventricular systolic function is normal. The right ventricular  size is normal. Tricuspid regurgitation signal is inadequate for assessing PA pressure.  3. Left atrial size was moderately dilated.  4. Right atrial size was moderately dilated.  5. The mitral valve is normal in structure. Trivial mitral valve regurgitation. No evidence of mitral stenosis.  6. The aortic valve is tricuspid. Aortic valve regurgitation is not visualized. No aortic stenosis is present.  7. There is mild dilatation of the ascending aorta, measuring 39 mm.  8. The inferior vena cava is normal in size with greater than 50% respiratory variability, suggesting right atrial pressure of 3 mmHg. FINDINGS  Left Ventricle: Left ventricular ejection fraction, by estimation, is 65 to 70%. The left ventricle has normal function. The left ventricle has no regional wall motion abnormalities. The left ventricular internal cavity size was normal in size. There is  borderline asymmetric left ventricular hypertrophy of the septal segment. Left ventricular diastolic parameters were normal. Right Ventricle: The right ventricular size is normal. No increase in right ventricular wall thickness. Right ventricular systolic function is normal. Tricuspid regurgitation signal is inadequate for assessing PA pressure. Left Atrium: Left atrial size was moderately dilated. Right Atrium: Right atrial size was moderately dilated. Pericardium: There is no evidence of pericardial effusion. Mitral Valve: The mitral valve is normal in structure. Trivial mitral valve regurgitation. No evidence of mitral valve stenosis. Tricuspid Valve: The tricuspid valve is grossly normal. Tricuspid valve regurgitation is trivial. No evidence of tricuspid stenosis. Aortic Valve: The aortic valve is tricuspid. Aortic valve regurgitation is not visualized. No aortic stenosis is present. Pulmonic Valve: The pulmonic valve was grossly normal. Pulmonic valve regurgitation is not visualized. No evidence of pulmonic stenosis. Aorta: The aortic root is normal in  size and structure. Ascending aorta measurements are within normal limits for age when indexed to body surface area. There is mild dilatation of the ascending aorta, measuring 39 mm. Venous: The inferior vena cava is normal in size with greater than 50% respiratory variability, suggesting right atrial pressure of 3 mmHg. IAS/Shunts: The atrial septum is grossly normal.  LEFT VENTRICLE PLAX 2D LVIDd:         3.70 cm   Diastology LVIDs:         2.40 cm   LV e' medial:    7.46 cm/s LV PW:         1.00 cm   LV E/e' medial:  12.3 LV IVS:        1.10 cm   LV e' lateral:   11.70 cm/s LVOT diam:     2.40 cm   LV E/e' lateral: 7.8 LV SV:         95 LV SV Index:   47 LVOT Area:     4.52 cm  RIGHT VENTRICLE RV S prime:     15.20 cm/s TAPSE (M-mode): 2.5 cm LEFT ATRIUM             Index        RIGHT ATRIUM           Index LA diam:        3.70 cm 1.83 cm/m   RA Area:     25.20 cm LA Vol (A2C):   62.0 ml 30.62 ml/m  RA Volume:   76.10 ml  37.59 ml/m LA Vol (A4C):   91.6 ml 45.24 ml/m LA Biplane Vol: 78.3 ml 38.67 ml/m  AORTIC VALVE LVOT Vmax:   96.00 cm/s LVOT Vmean:  68.200 cm/s LVOT VTI:    0.210 m  AORTA Ao Root diam: 3.40 cm Ao Asc diam:  3.90 cm MITRAL VALVE MV Area (PHT): 3.65 cm    SHUNTS MV Decel Time: 208 msec    Systemic VTI:  0.21 m MV E velocity: 91.40 cm/s  Systemic Diam: 2.40 cm MV A velocity: 76.40 cm/s MV E/A ratio:  1.20 Mihai Croitoru Atkins Electronically signed by Jerel Balding Atkins Signature Date/Time: 08/14/2024/3:18:31 PM    Final    EEG adult Result Date: 08/14/2024 Shelton Arlin KIDD, Atkins     08/14/2024  8:35 AM Patient Name: Davonte Siebenaler MRN: [id number] Epilepsy Attending: Arlin KIDD Shelton Referring Physician/Provider: Sundil, Subrina, Atkins Date: 08/14/2024 Duration: 21.58 mins Patient history: 83 y.o. male with history of dementia, expressive aphasia, essential tremor, HTN, bilateral carotid stenosis (s/p R CEA 2009), CKD III who presents after a transient episode of inability to follow instructions,  bowel incontinence. EEG to evaluate for seizure Level of alertness: Awake AEDs during EEG study: VPA Technical aspects: This EEG study was done with scalp electrodes positioned according to the 10-20 International system of electrode placement. Electrical activity was reviewed with band pass filter of 1-70Hz , sensitivity of 7 uV/mm, display speed of 39mm/sec with a 60Hz  notched filter applied as appropriate. EEG data were recorded continuously and digitally stored.  Video monitoring was available and reviewed as appropriate. Description: No clear posterior dominant rhythm was seen. EEG showed continuous generalized 3 to 6 Hz theta-delta slowing. Physiologic photic driving was not seen during photic stimulation. Hyperventilation was not performed.   ABNORMALITY - Continuous slow, generalized IMPRESSION: This study is suggestive of moderate diffuse encephalopathy. No seizures or epileptiform discharges were seen throughout the recording. Arlin KIDD Shelton   MR BRAIN WO CONTRAST Result Date: 08/14/2024 CLINICAL DATA:  Neuro deficit, acute, stroke suspected EXAM: MRI HEAD WITHOUT CONTRAST TECHNIQUE: Multiplanar, multiecho pulse sequences of the brain and surrounding structures were obtained without intravenous contrast. COMPARISON:  CT head from earlier today.  MRI head June 3, 21. FINDINGS: Due to patient intolerance, incomplete exam. Only DWI/ADC and sagittal T1 were  performed. No evidence of acute infarct. No obvious evidence of acute hemorrhage, mass lesion, midline shift or hydrocephalus. IMPRESSION: Incomplete exam without evidence of acute infarct or other obvious acute abnormality. Electronically Signed   By: Gilmore GORMAN Molt M.D.   On: 08/14/2024 00:58   CT Head Wo Contrast Result Date: 08/13/2024 CLINICAL DATA:  Mental status changes, unknown cause. EXAM: CT HEAD WITHOUT CONTRAST TECHNIQUE: Contiguous axial images were obtained from the base of the skull through the vertex without intravenous contrast.  RADIATION DOSE REDUCTION: This exam was performed according to the departmental dose-optimization program which includes automated exposure control, adjustment of the mA and/or kV according to patient size and/or use of iterative reconstruction technique. COMPARISON:  MRI brain 05/29/2020. FINDINGS: Brain: There is moderately advanced cerebral atrophy, small-vessel disease and atrophic ventriculomegaly, with unremarkable cerebellum and brainstem. There is a partially empty sella. No cortical based acute infarct, hemorrhage, mass or mass effect is seen with CT. There is no midline shift. Basal cisterns are clear. Vascular: There are calcific plaques in both distal vertebral arteries and both siphons. No hyperdense central vessel is seen. Skull: Negative for fractures or focal lesions. Sinuses/Orbits: There is mild membrane thickening in the maxillary sinuses. Other paranasal sinuses, bilateral mastoid air cells, and middle ears are clear. The nasal septum is midline. Old lens replacements noted with otherwise negative orbits. Other: None. IMPRESSION: 1. No acute intracranial CT findings. 2. Atrophy and small-vessel disease. Slightly interval progressed since 2021. 3. Sinus membrane disease. Electronically Signed   By: Francis Quam M.D.   On: 08/13/2024 22:16   DG Chest Port 1 View Result Date: 08/13/2024 CLINICAL DATA:  Questionable sepsis - evaluate for abnormality EXAM: PORTABLE CHEST 1 VIEW COMPARISON:  None Available. FINDINGS: The heart and mediastinal contours are within normal limits. No focal consolidation. No pulmonary edema. No pleural effusion. No pneumothorax. No acute osseous abnormality. IMPRESSION: No active disease. Electronically Signed   By: Morgane  Naveau M.D.   On: 08/13/2024 22:12        Scheduled Meds:  aspirin  EC  81 mg Oral Daily   colchicine   0.6 mg Oral Daily   docusate sodium   100 mg Oral BID   donepezil   10 mg Oral QHS   heparin   5,000 Units Subcutaneous Q8H   irbesartan    37.5 mg Oral Daily   rosuvastatin   20 mg Oral Daily   sodium chloride  flush  3 mL Intravenous Q12H   Continuous Infusions:  sodium chloride  75 mL/hr at 08/15/24 0732     LOS: 1 day      Brayton Lye, Atkins [facility]   To contact the attending provider between 7A-7P or the covering provider during after hours 7P-7A, please log into the web site [email or web address] and access using universal [place] password for that web site. If you do not have the password, please call the hospital operator.  08/15/2024, 11:31 AM

## 2024-08-15 NOTE — Progress Notes (Signed)
 ? ?  Inpatient Rehab Admissions Coordinator : ? ?Per therapy recommendations patient was screened for CIR candidacy by Ottie Glazier RN MSN. Patient does not appear to demonstrate the medical neccesity for a Hospital Rehabilitation /CIR admit. I will not place a Rehab Consult. Recommend other Rehab Venues to be pursued. Please contact me with any questions. ? ?Ottie Glazier RN MSN ?Admissions Coordinator ?[phone]  ?

## 2024-08-15 NOTE — TOC Initial Note (Signed)
 Transition of Care [facility]) - Initial/Assessment Note    Patient Details  Name: Blake Atkins MRN: [id number] Date of Birth: Aug 29, 1941  Transition of Care [facility]) CM/SW Contact:    Sherline Clack, LCSWA Phone Number: 08/15/2024, 2:40 PM  Clinical Narrative:                  CSW spoke with wife/Dianna at bedside to discuss options for anticipated discharge. Patient was asleep when CSW entered the room and awoke later. Wife informed CSW that patient and herself prefer to discharge home with home health instead of going to SNF. Wife indicated patient has a cane and a walker without wheels at home, and asked about getting a bedside commode and a wheelchair for the patient. CSW will reach out to CM to submit a DME referral for a wheelchair and bedside commode. Wife let CSW know she had started a claim on behalf of patient on July 17th with their long-term care insurance for DME, but has not heard back yet. When asked about transportation home after discharge, wife indicated she would decide  tomorrow (8/21) whether to transport patient via private vehicle or nonemergency ambulance. CSW will continue to follow as needs arise.    Expected Discharge Plan: Home w Home Health Services Barriers to Discharge: Continued Medical Work up   Patient Goals and CMS Choice Patient states their goals for this hospitalization and ongoing recovery are:: To get better and go home          Expected Discharge Plan and Services     Post Acute Care Choice: Home Health Living arrangements for the past 2 months: Single Family Home                                      Prior Living Arrangements/Services Living arrangements for the past 2 months: Single Family Home Lives with:: Spouse                   Activities of Daily Living   ADL Screening (condition at time of admission) Independently performs ADLs?: Yes (appropriate for developmental age) Is the patient deaf or have  difficulty hearing?: No Does the patient have difficulty seeing, even when wearing glasses/contacts?: No Does the patient have difficulty concentrating, remembering, or making decisions?: Yes  Permission Sought/Granted                  Emotional Assessment Appearance:: Appears stated age Attitude/Demeanor/Rapport: Unable to Assess Affect (typically observed): Unable to Assess Orientation: : Oriented to Self      Admission diagnosis:  New onset seizure [facility]) [R56.9] Patient Active Problem List   Diagnosis Date Noted   Altered mental status 08/14/2024   New onset seizure (HCC) 08/14/2024   History of dementia 08/14/2024   History of expressive aphasia 08/14/2024   Essential hypertension 08/14/2024   CKD stage 3b, GFR 30-44 ml/min (HCC) 08/14/2024   Syncope 08/14/2024   Aftercare following surgery of the circulatory system, NEC 01/18/2014   Occlusion and stenosis of carotid artery without mention of cerebral infarction 12/23/2011   PCP:  Loreli Elsie JONETTA [name], MD Pharmacy:   [facility] - [place], [place] - [address] [place] [place] [place] [address] Phone: [phone] Fax: [phone]  [facility] [address] [place] [address] Phone: [phone] Fax: [phone]     Social Drivers  of Health (SDOH) Social History: SDOH Screenings   Food Insecurity: No Food Insecurity (08/14/2024)  Housing: Low Risk  (08/14/2024)  Transportation Needs: No Transportation Needs (08/14/2024)  Utilities: Not At Risk (08/14/2024)  Social Connections: Socially Integrated (08/14/2024)  Tobacco Use: Low Risk  (08/14/2024)   SDOH Interventions:     Readmission Risk Interventions     No data to display

## 2024-08-15 NOTE — Evaluation (Signed)
 Physical Therapy Evaluation Patient Details Name: Blake Atkins MRN: [id number] DOB: [birth year] Today's Date: 08/15/2024  History of Present Illness  Pt is an 83 y.o. male presenting 8/18 after fall from EOB, bowel incontinence, confusion, BLE tremor. PMH significant for dementia complicated by expressive aphasia, HTN, CVA, HLD, CKD III.  Clinical Impression  Pt presents with admitting diagnosis above. Co-treat with OT. Pt today was able to ambulate in hallway with +2 Min A HHA. PTA pt wife reports that pt was independent on Monday and even mowing the lawn. Pt does have expressive aphasia at baseline and required frequent cues with simple commands to stay on task. Patient will benefit from intensive inpatient follow-up therapy, >3 hours/day. PT will continue to follow.         If plan is discharge home, recommend the following: A lot of help with walking and/or transfers;A lot of help with bathing/dressing/bathroom;Assistance with cooking/housework;Direct supervision/assist for medications management;Direct supervision/assist for financial management;Assist for transportation;Help with stairs or ramp for entrance;Supervision due to cognitive status   Can travel by private vehicle        Equipment Recommendations Rolling walker (2 wheels)  Recommendations for Other Services  Rehab consult    Functional Status Assessment Patient has had a recent decline in their functional status and demonstrates the ability to make significant improvements in function in a reasonable and predictable amount of time.     Precautions / Restrictions Precautions Precautions: Fall Restrictions Weight Bearing Restrictions Per Provider Order: No      Mobility  Bed Mobility Overal bed mobility: Needs Assistance Bed Mobility: Supine to Sit, Sit to Supine     Supine to sit: Contact guard Sit to supine: Contact guard assist   General bed mobility comments: no physical assistance provided however pt  requiring Mod cues.    Transfers Overall transfer level: Needs assistance Equipment used: 2 person hand held assist Transfers: Sit to/from Stand Sit to Stand: +2 physical assistance, Min assist           General transfer comment: +2 Min A to power up.    Ambulation/Gait Ambulation/Gait assistance: +2 physical assistance, Min assist Gait Distance (Feet): 75 Feet Assistive device: 2 person hand held assist Gait Pattern/deviations: Leaning posteriorly, Decreased stride length, Step-through pattern, Shuffle Gait velocity: decreased     General Gait Details: Pt frequently required cues to keep walking and stay on task. Pt easily distracted by wife. Pt occasionally shuffled and would lean posteriorly at times.  Stairs            Wheelchair Mobility     Tilt Bed    Modified Rankin (Stroke Patients Only)       Balance Overall balance assessment: Needs assistance Sitting-balance support: Single extremity supported, Feet supported, No upper extremity supported Sitting balance-Leahy Scale: Fair Sitting balance - Comments: Pt would occasionally lean posteriorly when seated EOB.   Standing balance support: Bilateral upper extremity supported, During functional activity Standing balance-Leahy Scale: Poor Standing balance comment: Reliant on HHA                             Pertinent Vitals/Pain Pain Assessment Pain Assessment: Faces Faces Pain Scale: No hurt Pain Intervention(s): Monitored during session    Home Living Family/patient expects to be discharged to:: Private residence Living Arrangements: Spouse/significant other Available Help at Discharge: Family;Available 24 hours/day Type of Home: House Home Access: Stairs to enter Entrance Stairs-Rails: Right;Left Entrance Stairs-Number of Steps: 4  STE back bil rails, cannot reach both; 6 STE at garage bil rails can reach both   Home Layout: One level Home Equipment: Standard Walker;Cane - single  point;Grab bars - tub/shower      Prior Function Prior Level of Function : Needs assist             Mobility Comments: no AD ADLs Comments: wife assist with bathing/dressing. pt normally able to groom self standing at sink, able to mow the yard. pt does not drive     Extremity/Trunk Assessment   Upper Extremity Assessment Upper Extremity Assessment: Defer to OT evaluation    Lower Extremity Assessment Lower Extremity Assessment: Generalized weakness    Cervical / Trunk Assessment Cervical / Trunk Assessment: Normal  Communication   Communication Communication: Impaired Factors Affecting Communication: Difficulty expressing self (receptive difficulties at times if not given direct simple commands)    Cognition Arousal: Alert Behavior During Therapy: WFL for tasks assessed/performed, Impulsive                             Following commands: Impaired Following commands impaired: Follows one step commands with increased time, Follows one step commands inconsistently (needs direct simple commands.)     Cueing Cueing Techniques: Verbal cues, Tactile cues, Gestural cues     General Comments General comments (skin integrity, edema, etc.): VSS    Exercises     Assessment/Plan    PT Assessment Patient needs continued PT services  PT Problem List Decreased strength;Decreased range of motion;Decreased activity tolerance;Decreased balance;Decreased mobility;Decreased coordination;Decreased cognition;Decreased knowledge of use of DME;Decreased safety awareness;Decreased knowledge of precautions;Cardiopulmonary status limiting activity       PT Treatment Interventions DME instruction;Gait training;Stair training;Functional mobility training;Therapeutic activities;Therapeutic exercise;Balance training;Neuromuscular re-education;Cognitive remediation;Patient/family education    PT Goals (Current goals can be found in the Care Plan section)  Acute Rehab PT  Goals PT Goal Formulation: With patient/family Time For Goal Achievement: 08/29/24 Potential to Achieve Goals: Fair    Frequency Min 2X/week     Co-evaluation PT/OT/SLP Co-Evaluation/Treatment: Yes Reason for Co-Treatment: Complexity of the patient's impairments (multi-system involvement);For patient/therapist safety;To address functional/ADL transfers;Necessary to address cognition/behavior during functional activity PT goals addressed during session: Mobility/safety with mobility OT goals addressed during session: ADL's and self-care;Strengthening/ROM       AM-PAC PT 6 Clicks Mobility  Outcome Measure Help needed turning from your back to your side while in a flat bed without using bedrails?: A Little Help needed moving from lying on your back to sitting on the side of a flat bed without using bedrails?: A Little Help needed moving to and from a bed to a chair (including a wheelchair)?: A Lot Help needed standing up from a chair using your arms (e.g., wheelchair or bedside chair)?: A Lot Help needed to walk in hospital room?: A Lot Help needed climbing 3-5 steps with a railing? : A Lot 6 Click Score: 14    End of Session Equipment Utilized During Treatment: Gait belt Activity Tolerance: Patient tolerated treatment well Patient left: in bed;with call bell/phone within reach;with bed alarm set;with nursing/sitter in room Nurse Communication: Mobility status PT Visit Diagnosis: Other abnormalities of gait and mobility (R26.89)    Time: 9062-8989 PT Time Calculation (min) (ACUTE ONLY): 33 min   Charges:   PT Evaluation $PT Eval Moderate Complexity: 1 Mod   PT General Charges $$ ACUTE PT VISIT: 1 Visit         Jaydyn Menon  KATHEE, PT, DPT Acute Rehab Services [phone]   Melika Reder 08/15/2024, 11:29 AM

## 2024-08-16 ENCOUNTER — Other Ambulatory Visit (HOSPITAL_COMMUNITY): Payer: Self-pay

## 2024-08-16 DIAGNOSIS — R4182 Altered mental status, unspecified: Secondary | ICD-10-CM | POA: Diagnosis not present

## 2024-08-16 LAB — CBC
HCT: 41.4 % (ref 39.0–52.0)
Hemoglobin: 13.9 g/dL (ref 13.0–17.0)
MCH: 33.5 pg (ref 26.0–34.0)
MCHC: 33.6 g/dL (ref 30.0–36.0)
MCV: 99.8 fL (ref 80.0–100.0)
Platelets: 290 K/uL (ref 150–400)
RBC: 4.15 MIL/uL — ABNORMAL LOW (ref 4.22–5.81)
RDW: 12.3 % (ref 11.5–15.5)
WBC: 6.3 K/uL (ref 4.0–10.5)
nRBC: 0 % (ref 0.0–0.2)

## 2024-08-16 LAB — BASIC METABOLIC PANEL WITH GFR
Anion gap: 9 (ref 5–15)
BUN: 16 mg/dL (ref 8–23)
CO2: 25 mmol/L (ref 22–32)
Calcium: 9.1 mg/dL (ref 8.9–10.3)
Chloride: 104 mmol/L (ref 98–111)
Creatinine, Ser: 1.29 mg/dL — ABNORMAL HIGH (ref 0.61–1.24)
GFR, Estimated: 56 mL/min — ABNORMAL LOW (ref >60)
Glucose, Bld: 89 mg/dL (ref 70–99)
Potassium: 4.5 mmol/L (ref 3.5–5.1)
Sodium: 138 mmol/L (ref 135–145)

## 2024-08-16 MED ORDER — COLCHICINE 0.6 MG PO TABS
0.6000 mg | ORAL_TABLET | Freq: Every day | ORAL | 0 refills | Status: AC
  Filled 2024-08-16: qty 10, 10d supply, fill #0

## 2024-08-16 NOTE — Progress Notes (Signed)
 Occupational Therapy Treatment Patient Details Name: Blake Atkins MRN: [id number] DOB: 1941-05-28 Today's Date: 08/16/2024   History of present illness Pt is an 83 y.o. male presenting 8/18 after fall from EOB, bowel incontinence, confusion, BLE tremor. PMH significant for dementia complicated by expressive aphasia, HTN, CVA, HLD, CKD III.   OT comments  Patient making good gains with OT treatment. Patient's wife present during visit and was provided education on cues and assistance to provided with self care, mobility, and transfers due to patient and family are now deciding on discharging home with Home Health. Patient was CGA to get to EOB and min assist for ambulating to sink and transfer to chair. Patient wife assisted patient with self care with COTA providing education to increase patient independence. Patient wife was able to assist patient to recliner with ambulating with RW.  Currently recommendations for discharge are HHOT with 3n1 BSC and RW.  Acute OT to continue to follow while in this setting to address established goals.       If plan is discharge home, recommend the following:  A lot of help with bathing/dressing/bathroom;Assist for transportation;Help with stairs or ramp for entrance;A little help with walking and/or transfers   Equipment Recommendations  BSC/3in1;Other (comment) (RW)    Recommendations for Other Services      Precautions / Restrictions Precautions Precautions: Fall Restrictions Weight Bearing Restrictions Per Provider Order: No       Mobility Bed Mobility Overal bed mobility: Needs Assistance Bed Mobility: Supine to Sit     Supine to sit: Contact guard     General bed mobility comments: cues to perform and increased time    Transfers Overall transfer level: Needs assistance Equipment used: Rolling walker (2 wheels) Transfers: Sit to/from Stand, Bed to chair/wheelchair/BSC Sit to Stand: Min assist     Step pivot transfers: Min assist      General transfer comment: patient's wife assisted with ambulatng to recliner with education on cues and assistance to provided     Balance Overall balance assessment: Needs assistance Sitting-balance support: Single extremity supported, Feet supported, No upper extremity supported Sitting balance-Leahy Scale: Fair Sitting balance - Comments: required one extremity support initially but progressed to no UE support   Standing balance support: Single extremity supported, Bilateral upper extremity supported, During functional activity Standing balance-Leahy Scale: Poor Standing balance comment: able to assist with self care while standing with one extremity support                           ADL either performed or assessed with clinical judgement   ADL Overall ADL's : Needs assistance/impaired     Grooming: Wash/dry hands;Wash/dry face;Supervision/safety;Cueing for sequencing;Sitting   Upper Body Bathing: Set up;Sitting;Cueing for sequencing   Lower Body Bathing: Moderate assistance;Sit to/from stand Lower Body Bathing Details (indicate cue type and reason): required assistance to complete peri care while standing Upper Body Dressing : Sitting;Minimal assistance   Lower Body Dressing: Moderate assistance;Sit to/from stand Lower Body Dressing Details (indicate cue type and reason): donned pull up brief with mod assist             Functional mobility during ADLs: Minimal assistance;Rolling walker (2 wheels) General ADL Comments: Family education on self care provided to wife during session    Extremity/Trunk Assessment              Vision       Perception     Praxis  Communication Communication Communication: Impaired Factors Affecting Communication: Hearing impaired;Difficulty expressing self (wears hearing aides but not present)   Cognition Arousal: Alert Behavior During Therapy: WFL for tasks assessed/performed, Impulsive Cognition: History of  cognitive impairments             OT - Cognition Comments: requires increased time for follow commands, could possible be also due to no hearing aides                 Following commands: Impaired Following commands impaired: Follows one step commands with increased time, Follows multi-step commands inconsistently      Cueing   Cueing Techniques: Verbal cues, Tactile cues, Gestural cues  Exercises      Shoulder Instructions       General Comments Patient's wife present during visit with education provided on cues and assistance to provide with self care, mobility, and transfers    Pertinent Vitals/ Pain       Pain Assessment Pain Assessment: Faces Faces Pain Scale: No hurt Pain Intervention(s): Monitored during session  Home Living                                          Prior Functioning/Environment              Frequency  Min 2X/week        Progress Toward Goals  OT Goals(current goals can now be found in the care plan section)  Progress towards OT goals: Progressing toward goals  Acute Rehab OT Goals Patient Stated Goal: to go home OT Goal Formulation: With patient/family Time For Goal Achievement: 08/29/24 Potential to Achieve Goals: Good ADL Goals Pt Will Perform Eating: with set-up;sitting Pt Will Perform Grooming: with supervision;standing Pt Will Perform Upper Body Dressing: with set-up;sitting Pt Will Transfer to Toilet: with supervision;ambulating  Plan      Co-evaluation                 AM-PAC OT 6 Clicks Daily Activity     Outcome Measure   Help from another person eating meals?: A Little Help from another person taking care of personal grooming?: A Little Help from another person toileting, which includes using toliet, bedpan, or urinal?: A Lot Help from another person bathing (including washing, rinsing, drying)?: A Lot Help from another person to put on and taking off regular upper body  clothing?: A Little Help from another person to put on and taking off regular lower body clothing?: A Lot 6 Click Score: 15    End of Session Equipment Utilized During Treatment: Gait belt;Rolling walker (2 wheels)  OT Visit Diagnosis: Unsteadiness on feet (R26.81);Muscle weakness (generalized) (M62.81);Other symptoms and signs involving cognitive function   Activity Tolerance Patient tolerated treatment well   Patient Left in chair;with call bell/phone within reach;with chair alarm set;with family/visitor present   Nurse Communication Mobility status        Time: 9179-9144 OT Time Calculation (min): 35 min  Charges: OT General Charges $OT Visit: 1 Visit OT Treatments $Self Care/Home Management : 23-37 mins  Dick Laine, OTA Acute Rehabilitation Services  Office [phone]   Jeb LITTIE Laine 08/16/2024, 9:35 AM

## 2024-08-16 NOTE — Progress Notes (Signed)
 DC order noted per MD. DC RN at bedside.  PIV removed prior to arrival. Skin intact. AVS printed/reviewed with patient wife at bedside. All belongings accounted for. BSC/wheelchair confirmed at bedside per CM. Volunteer transport present for discharge, TOC meds picked up on transport to private vehicle.

## 2024-08-16 NOTE — Progress Notes (Signed)
 Physical Therapy Treatment Patient Details Name: Blake Atkins MRN: [id number] DOB: 12-Dec-1941 Today's Date: 08/16/2024   History of Present Illness Pt is an 83 y.o. male presenting 8/18 after fall from EOB, bowel incontinence, confusion, BLE tremor. PMH significant for dementia complicated by expressive aphasia, HTN, CVA, HLD, CKD III.    PT Comments  Pt tolerated treatment well today. Pt today was able to ambulate in hallway with RW CGA and navigate a flight of stairs with CGA/Min A. Pt wife present and educated on proper use of gait belt and RW. DC recs updated to HHPT. PT will continue to follow. Pt and family anticipate DC home today.    If plan is discharge home, recommend the following: Assistance with cooking/housework;Direct supervision/assist for medications management;Direct supervision/assist for financial management;Assist for transportation;Help with stairs or ramp for entrance;Supervision due to cognitive status;A little help with walking and/or transfers;A little help with bathing/dressing/bathroom   Can travel by private vehicle        Equipment Recommendations  Rolling walker (2 wheels)    Recommendations for Other Services       Precautions / Restrictions Precautions Precautions: Fall Recall of Precautions/Restrictions: Impaired Restrictions Weight Bearing Restrictions Per Provider Order: No     Mobility  Bed Mobility               General bed mobility comments: Up in recliner    Transfers Overall transfer level: Needs assistance Equipment used: Rolling walker (2 wheels), None Transfers: Sit to/from Stand Sit to Stand: Min assist           General transfer comment: Cues for hand placement.    Ambulation/Gait Ambulation/Gait assistance: Contact guard assist Gait Distance (Feet): 200 Feet Assistive device: Rolling walker (2 wheels) Gait Pattern/deviations: Trunk flexed, Decreased stride length, Step-through pattern Gait velocity: decreased      General Gait Details: Cues for proximity to RW.   Stairs Stairs: Yes Stairs assistance: Contact guard assist, Min assist Stair Management: One rail Left, Two rails, Alternating pattern, Forwards Number of Stairs: 10 General stair comments: 1 minor LOB at top step requiring Min A to correct. Pt overall CGA. Pt alternating between two rails and 1 rail with HHA.   Wheelchair Mobility     Tilt Bed    Modified Rankin (Stroke Patients Only)       Balance Overall balance assessment: Needs assistance Sitting-balance support: Single extremity supported, Feet supported, No upper extremity supported Sitting balance-Leahy Scale: Fair Sitting balance - Comments: required one extremity support initially but progressed to no UE support   Standing balance support: Single extremity supported, Bilateral upper extremity supported, During functional activity Standing balance-Leahy Scale: Poor Standing balance comment: able to assist with self care while standing with one extremity support                            Communication Communication Communication: Impaired Factors Affecting Communication: Hearing impaired;Difficulty expressing self (wears hearing aides but not present)  Cognition Arousal: Alert Behavior During Therapy: WFL for tasks assessed/performed, Impulsive                             Following commands: Impaired Following commands impaired: Follows one step commands with increased time, Follows multi-step commands inconsistently    Cueing Cueing Techniques: Verbal cues, Tactile cues, Gestural cues  Exercises      General Comments General comments (skin integrity, edema, etc.): VSS.  Educated wife on gait belt and RW management.      Pertinent Vitals/Pain Pain Assessment Pain Assessment: Faces Faces Pain Scale: No hurt    Home Living                          Prior Function            PT Goals (current goals can now be  found in the care plan section) Progress towards PT goals: Progressing toward goals    Frequency    Min 2X/week      PT Plan      Co-evaluation              AM-PAC PT 6 Clicks Mobility   Outcome Measure  Help needed turning from your back to your side while in a flat bed without using bedrails?: A Little Help needed moving from lying on your back to sitting on the side of a flat bed without using bedrails?: A Little Help needed moving to and from a bed to a chair (including a wheelchair)?: A Little Help needed standing up from a chair using your arms (e.g., wheelchair or bedside chair)?: A Little Help needed to walk in hospital room?: A Little Help needed climbing 3-5 steps with a railing? : A Little 6 Click Score: 18    End of Session Equipment Utilized During Treatment: Gait belt Activity Tolerance: Patient tolerated treatment well Patient left: in bed;with call bell/phone within reach;with bed alarm set;with nursing/sitter in room Nurse Communication: Mobility status PT Visit Diagnosis: Other abnormalities of gait and mobility (R26.89)     Time: 8949-8876 PT Time Calculation (min) (ACUTE ONLY): 33 min  Charges:    $Gait Training: 8-22 mins $Therapeutic Activity: 8-22 mins PT General Charges $$ ACUTE PT VISIT: 1 Visit                     Blake Atkins, PT, DPT Acute Rehab Services [phone]    Blake Atkins 08/16/2024, 12:15 PM

## 2024-08-16 NOTE — Plan of Care (Signed)
  Problem: Education: Goal: Knowledge of General Education information will improve Description: Including pain rating scale, medication(s)/side effects and non-pharmacologic comfort measures Outcome: Progressing   Problem: Clinical Measurements: Goal: Ability to maintain clinical measurements within normal limits will improve Outcome: Progressing Goal: Will remain free from infection Outcome: Progressing Goal: Diagnostic test results will improve Outcome: Progressing Goal: Respiratory complications will improve Outcome: Progressing Goal: Cardiovascular complication will be avoided Outcome: Progressing   Problem: Activity: Goal: Risk for activity intolerance will decrease Outcome: Progressing   Problem: Nutrition: Goal: Adequate nutrition will be maintained Outcome: Progressing   Problem: Elimination: Goal: Will not experience complications related to bowel motility Outcome: Progressing

## 2024-08-16 NOTE — Discharge Summary (Signed)
 Physician Discharge Summary  Blake Atkins FMW:[id number] DOB: 12/25/1941 DOA: 08/13/2024  PCP: Loreli Elsie JONETTA [name], MD  Admit date: 08/13/2024 Discharge date: 08/16/2024  Admitted From: (Home) Disposition:  (Home )  Recommendations for Outpatient Follow-up:  Follow up with PCP in 1-2 weeks Please obtain BMP/CBC in one week   Home Health: (YES)  Diet recommendation: Heart Healthy   Brief/Interim Summary:   83 y.o. male with medical history significant of dementia complicated by expressive aphasia, essential hypertension, CVA, hyperlipidemia and CKD stage III presented emergency department transient episodes of lower extremity tremulousness, bowel incontinence and confusion. CT head and MRI brain negative for stroke. No epileptiform activities on EEG. He had swollen and tender Right knee and lower leg, please see discussion below.        Acute metabolic encephalopathy,POA: No evidence of acute stroke. Patient has expressive aphasia at baseline No fever or neck stiffness to suggest meningitis No growth on blood cultures He does have right knee tenderness, warmth and swelling.  - Seizures ruled out, previous MD discussed with neurology, Depakote  has been discontinued, No evidence of epileptiform activity on the EEG. It does show moderate diffuse encephalopathy.  -Patient much improved, back at baseline as discussed with wife at bedside   Right knee inflammatory arthritis  Acute gout arthritis  H/o prior right knee replacement - Venous Dopplers was negative for DVT - Orthopedic consult greatly appreciated, status post arthrocentesis, workup significant for inflammatory arthritis, secondary to gout/pseudogout showing both urate and phosphate crystals -Procalcitonin within normal limit, no concern for infections, arthrocentesis cultures and Gram stain are negative -Continue with colchicine , pain and swelling much improved, to finish total of 10 days.     Alzheimer's dementia,POA:  Continue with memantine   Essential Hypertension: Takes Olmesartan at home. BP has been up and down here in the hospital.   CKD 3A: continue hydration     Discharge Diagnoses:  Principal Problem:   New onset seizure (HCC) Active Problems:   Altered mental status   History of dementia   History of expressive aphasia   Essential hypertension   CKD stage 3b, GFR 30-44 ml/min (HCC)   Syncope    Discharge Instructions  Discharge Instructions     Diet - low sodium heart healthy   Complete by: As directed    Discharge instructions   Complete by: As directed    Follow with Primary MD Loreli Elsie JONETTA [name], MD in 7 days   Get CBC, CMP, checked  by Primary MD next visit.    Activity: As tolerated with Full fall precautions use walker/cane & assistance as needed   Disposition Home    Diet: Heart Healthy  , with feeding assistance and aspiration precautions.  For Heart failure patients - Check your Weight same time everyday, if you gain over 2 pounds, or you develop in leg swelling, experience more shortness of breath or chest pain, call your Primary MD immediately. Follow Cardiac Low Salt Diet and 1.5 lit/day fluid restriction.   On your next visit with your primary care physician please Get Medicines reviewed and adjusted.   Please request your Prim.MD to go over all Hospital Tests and Procedure/Radiological results at the follow up, please get all Hospital records sent to your Prim MD by signing hospital release before you go home.   If you experience worsening of your admission symptoms, develop shortness of breath, life threatening emergency, suicidal or homicidal thoughts you must seek medical attention immediately by calling 911 or calling your  MD immediately  if symptoms less severe.  You Must read complete instructions/literature along with all the possible adverse reactions/side effects for all the Medicines you take and that have been prescribed to you. Take any new  Medicines after you have completely understood and accpet all the possible adverse reactions/side effects.   Do not drive, operating heavy machinery, perform activities at heights, swimming or participation in water activities or provide baby sitting services if your were admitted for syncope or siezures until you have seen by Primary MD or a Neurologist and advised to do so again.  Do not drive when taking Pain medications.    Do not take more than prescribed Pain, Sleep and Anxiety Medications  Special Instructions: If you have smoked or chewed Tobacco  in the last 2 yrs please stop smoking, stop any regular Alcohol   and or any Recreational drug use.  Wear Seat belts while driving.   Please note  You were cared for by a hospitalist during your hospital stay. If you have any questions about your discharge medications or the care you received while you were in the hospital after you are discharged, you can call the unit and asked to speak with the hospitalist on call if the hospitalist that took care of you is not available. Once you are discharged, your primary care physician will handle any further medical issues. Please note that NO REFILLS for any discharge medications will be authorized once you are discharged, as it is imperative that you return to your primary care physician (or establish a relationship with a primary care physician if you do not have one) for your aftercare needs so that they can reassess your need for medications and monitor your lab values.   Increase activity slowly   Complete by: As directed       Allergies as of 08/16/2024   No Known Allergies      Medication List     STOP taking these medications    sildenafil 100 MG tablet Commonly known as: VIAGRA       TAKE these medications    acetaminophen  500 MG tablet Commonly known as: TYLENOL  Take 1,000 mg by mouth every 8 (eight) hours as needed for moderate pain.   aspirin  EC 81 MG tablet Take 81  mg by mouth daily.   colchicine  0.6 MG tablet Take 1 tablet (0.6 mg total) by mouth daily for 10 days.   donepezil  10 MG tablet Commonly known as: ARICEPT  Take 10 mg by mouth at bedtime.   olmesartan 20 MG tablet Commonly known as: BENICAR Take 20 mg by mouth daily.   PreserVision AREDS 2 Caps Take 1 capsule by mouth in the morning and at bedtime.   rosuvastatin  20 MG tablet Commonly known as: CRESTOR  Take 20 mg by mouth daily.   Systane Ultra PF 0.4-0.3 % Soln Generic drug: Polyethyl Glyc-Propyl Glyc PF Place 1 drop into both eyes 2 (two) times daily as needed (dry eyes).               Durable Medical Equipment  (From admission, onward)           Start     Ordered   08/15/24 1453  For home use only DME Bedside commode  Once       Question:  Patient needs a bedside commode to treat with the following condition  Answer:  Weakness   08/15/24 1453   08/15/24 1452  For home use only DME lightweight manual wheelchair  with seat cushion  Once       Comments: Patient suffers from weakness which impairs their ability to perform daily activities like bathing in the home.  A cane will not resolve  issue with performing activities of daily living. A wheelchair will allow patient to safely perform daily activities. Patient is not able to propel themselves in the home using a standard weight wheelchair due to general weakness. Patient can self propel in the lightweight wheelchair. Length of need Lifetime. Accessories: elevating leg rests (ELRs), wheel locks, extensions and anti-tippers. 5 feet 9 inches 190.3 pounds   08/15/24 1453            No Known Allergies  Consultations: Orthopedic   Procedures/Studies: VAS US  LOWER EXTREMITY VENOUS (DVT) Result Date: 08/14/2024  Lower Venous DVT Study Patient Name:  Blake Atkins  Date of Exam:   08/14/2024 Medical Rec #: [id number]       Accession #:    [id number] Date of Birth: 1941/02/21        Patient Gender: M Patient Age:    39 years Exam Location:  [facility] Procedure:      VAS US  LOWER EXTREMITY VENOUS (DVT) Referring Phys: DELILIAH RASHID --------------------------------------------------------------------------------  Indications: Swelling, and Edema.  Limitations: Uncontrollably shaking, pt hard of hearing and unable to follow commands for optiomal imaging. Performing Technologist: Elmarie Lindau, RVT  Examination Guidelines: A complete evaluation includes B-mode imaging, spectral Doppler, color Doppler, and power Doppler as needed of all accessible portions of each vessel. Bilateral testing is considered an integral part of a complete examination. Limited examinations for reoccurring indications may be performed as noted. The reflux portion of the exam is performed with the patient in reverse Trendelenburg.  +---------+---------------+---------+-----------+----------+--------------+ RIGHT    CompressibilityPhasicitySpontaneityPropertiesThrombus Aging +---------+---------------+---------+-----------+----------+--------------+ CFV      Full           Yes      Yes                                 +---------+---------------+---------+-----------+----------+--------------+ SFJ      Full                                                        +---------+---------------+---------+-----------+----------+--------------+ FV Prox  Full                                                        +---------+---------------+---------+-----------+----------+--------------+ FV Mid   Full                                                        +---------+---------------+---------+-----------+----------+--------------+ FV DistalFull                                                        +---------+---------------+---------+-----------+----------+--------------+  PFV      Full                                                         +---------+---------------+---------+-----------+----------+--------------+ POP      Full           Yes      Yes                                 +---------+---------------+---------+-----------+----------+--------------+ PTV      Full                                                        +---------+---------------+---------+-----------+----------+--------------+ PERO     Full                                                        +---------+---------------+---------+-----------+----------+--------------+     Summary: RIGHT: - There is no evidence of deep vein thrombosis in the lower extremity.  - No cystic structure found in the popliteal fossa.   *See table(s) above for measurements and observations. Electronically signed by Penne Colorado MD on 08/14/2024 at 6:04:22 PM.    Final    CT KNEE RIGHT WO CONTRAST Result Date: 08/14/2024 CLINICAL DATA:  Right knee swelling and tenderness. EXAM: CT OF THE RIGHT KNEE WITHOUT CONTRAST TECHNIQUE: Multidetector CT imaging of the right knee was performed according to the standard protocol. Multiplanar CT image reconstructions were also generated. RADIATION DOSE REDUCTION: This exam was performed according to the departmental dose-optimization program which includes automated exposure control, adjustment of the mA and/or kV according to patient size and/or use of iterative reconstruction technique. COMPARISON:  None Available. FINDINGS: Evaluation is somewhat limited due to motion artifact. Bones/Joint/Cartilage No acute fracture identified. No dislocation. Moderate to advanced tricompartmental degenerative arthropathy manifested by tricompartmental joint space narrowing and osteophytosis. There is a moderate sized knee joint effusion with foci of mineralization within the medial and lateral femorotibial compartments and joint space. No definite erosive changes, although evaluation is limited due to motion degradation. Ligaments Ligaments are suboptimally  evaluated by CT. Muscles and Tendons Muscles are normal. No muscle atrophy. No intramuscular fluid collection or hematoma. Quadriceps and patellar tendons are grossly intact. Soft tissue Mild prepatellar soft tissue swelling. No loculated fluid collection. No discrete soft tissue mass. IMPRESSION: 1. No acute fracture identified. 2. Moderate to advanced tricompartmental degenerative arthropathy of the knee with moderate-sized knee joint effusion and foci of mineralization, as can be seen in the setting of CPPD arthropathy or gout. No definite erosive changes identified, although evaluation is limited due to motion degradation. Electronically Signed   By: Harrietta Sherry M.D.   On: 08/14/2024 17:04   ECHOCARDIOGRAM COMPLETE Result Date: 08/14/2024    ECHOCARDIOGRAM REPORT   Patient Name:   Blake Atkins Date of Exam: 08/14/2024 Medical Rec #:  [id number]      Height:  69.0 in Accession #:    [id number]     Weight:       190.7 lb Date of Birth:  23-Jun-1941       BSA:          2.025 m Patient Age:    82 years       BP:           128/78 mmHg Patient Gender: M              HR:           60 bpm. Exam Location:  Inpatient Procedure: 2D Echo, Cardiac Doppler and Color Doppler (Both Spectral and Color            Flow Doppler were utilized during procedure). Indications:    R55 Syncope  History:        Patient has no prior history of Echocardiogram examinations.                 Risk Factors:Hypertension and Dyslipidemia.  Sonographer:    Damien Senior RDCS Referring Phys: [id number] SUBRINA SUNDIL  Sonographer Comments: Some pictures limited by patient movement, patient is pleasantly confused throughout exam IMPRESSIONS  1. Left ventricular ejection fraction, by estimation, is 65 to 70%. The left ventricle has normal function. The left ventricle has no regional wall motion abnormalities. Left ventricular diastolic parameters were normal.  2. Right ventricular systolic function is normal. The right ventricular size is  normal. Tricuspid regurgitation signal is inadequate for assessing PA pressure.  3. Left atrial size was moderately dilated.  4. Right atrial size was moderately dilated.  5. The mitral valve is normal in structure. Trivial mitral valve regurgitation. No evidence of mitral stenosis.  6. The aortic valve is tricuspid. Aortic valve regurgitation is not visualized. No aortic stenosis is present.  7. There is mild dilatation of the ascending aorta, measuring 39 mm.  8. The inferior vena cava is normal in size with greater than 50% respiratory variability, suggesting right atrial pressure of 3 mmHg. FINDINGS  Left Ventricle: Left ventricular ejection fraction, by estimation, is 65 to 70%. The left ventricle has normal function. The left ventricle has no regional wall motion abnormalities. The left ventricular internal cavity size was normal in size. There is  borderline asymmetric left ventricular hypertrophy of the septal segment. Left ventricular diastolic parameters were normal. Right Ventricle: The right ventricular size is normal. No increase in right ventricular wall thickness. Right ventricular systolic function is normal. Tricuspid regurgitation signal is inadequate for assessing PA pressure. Left Atrium: Left atrial size was moderately dilated. Right Atrium: Right atrial size was moderately dilated. Pericardium: There is no evidence of pericardial effusion. Mitral Valve: The mitral valve is normal in structure. Trivial mitral valve regurgitation. No evidence of mitral valve stenosis. Tricuspid Valve: The tricuspid valve is grossly normal. Tricuspid valve regurgitation is trivial. No evidence of tricuspid stenosis. Aortic Valve: The aortic valve is tricuspid. Aortic valve regurgitation is not visualized. No aortic stenosis is present. Pulmonic Valve: The pulmonic valve was grossly normal. Pulmonic valve regurgitation is not visualized. No evidence of pulmonic stenosis. Aorta: The aortic root is normal in size and  structure. Ascending aorta measurements are within normal limits for age when indexed to body surface area. There is mild dilatation of the ascending aorta, measuring 39 mm. Venous: The inferior vena cava is normal in size with greater than 50% respiratory variability, suggesting right atrial pressure of 3 mmHg. IAS/Shunts: The atrial septum is grossly normal.  LEFT VENTRICLE PLAX 2D LVIDd:         3.70 cm   Diastology LVIDs:         2.40 cm   LV e' medial:    7.46 cm/s LV PW:         1.00 cm   LV E/e' medial:  12.3 LV IVS:        1.10 cm   LV e' lateral:   11.70 cm/s LVOT diam:     2.40 cm   LV E/e' lateral: 7.8 LV SV:         95 LV SV Index:   47 LVOT Area:     4.52 cm  RIGHT VENTRICLE RV S prime:     15.20 cm/s TAPSE (M-mode): 2.5 cm LEFT ATRIUM             Index        RIGHT ATRIUM           Index LA diam:        3.70 cm 1.83 cm/m   RA Area:     25.20 cm LA Vol (A2C):   62.0 ml 30.62 ml/m  RA Volume:   76.10 ml  37.59 ml/m LA Vol (A4C):   91.6 ml 45.24 ml/m LA Biplane Vol: 78.3 ml 38.67 ml/m  AORTIC VALVE LVOT Vmax:   96.00 cm/s LVOT Vmean:  68.200 cm/s LVOT VTI:    0.210 m  AORTA Ao Root diam: 3.40 cm Ao Asc diam:  3.90 cm MITRAL VALVE MV Area (PHT): 3.65 cm    SHUNTS MV Decel Time: 208 msec    Systemic VTI:  0.21 m MV E velocity: 91.40 cm/s  Systemic Diam: 2.40 cm MV A velocity: 76.40 cm/s MV E/A ratio:  1.20 Mihai Croitoru MD Electronically signed by Jerel Balding MD Signature Date/Time: 08/14/2024/3:18:31 PM    Final    EEG adult Result Date: 08/14/2024 Shelton Arlin KIDD, MD     08/14/2024  8:35 AM Patient Name: Blake Atkins MRN: [id number] Epilepsy Attending: Arlin KIDD Shelton Referring Physician/Provider: Sundil, Subrina, MD Date: 08/14/2024 Duration: 21.58 mins Patient history: 83 y.o. male with history of dementia, expressive aphasia, essential tremor, HTN, bilateral carotid stenosis (s/p R CEA 2009), CKD III who presents after a transient episode of inability to follow instructions, bowel  incontinence. EEG to evaluate for seizure Level of alertness: Awake AEDs during EEG study: VPA Technical aspects: This EEG study was done with scalp electrodes positioned according to the 10-20 International system of electrode placement. Electrical activity was reviewed with band pass filter of 1-70Hz , sensitivity of 7 uV/mm, display speed of 66mm/sec with a 60Hz  notched filter applied as appropriate. EEG data were recorded continuously and digitally stored.  Video monitoring was available and reviewed as appropriate. Description: No clear posterior dominant rhythm was seen. EEG showed continuous generalized 3 to 6 Hz theta-delta slowing. Physiologic photic driving was not seen during photic stimulation. Hyperventilation was not performed.   ABNORMALITY - Continuous slow, generalized IMPRESSION: This study is suggestive of moderate diffuse encephalopathy. No seizures or epileptiform discharges were seen throughout the recording. Arlin KIDD Shelton   MR BRAIN WO CONTRAST Result Date: 08/14/2024 CLINICAL DATA:  Neuro deficit, acute, stroke suspected EXAM: MRI HEAD WITHOUT CONTRAST TECHNIQUE: Multiplanar, multiecho pulse sequences of the brain and surrounding structures were obtained without intravenous contrast. COMPARISON:  CT head from earlier today.  MRI head June 3, 21. FINDINGS: Due to patient intolerance, incomplete exam. Only DWI/ADC and sagittal T1 were  performed. No evidence of acute infarct. No obvious evidence of acute hemorrhage, mass lesion, midline shift or hydrocephalus. IMPRESSION: Incomplete exam without evidence of acute infarct or other obvious acute abnormality. Electronically Signed   By: Gilmore GORMAN Molt M.D.   On: 08/14/2024 00:58   CT Head Wo Contrast Result Date: 08/13/2024 CLINICAL DATA:  Mental status changes, unknown cause. EXAM: CT HEAD WITHOUT CONTRAST TECHNIQUE: Contiguous axial images were obtained from the base of the skull through the vertex without intravenous contrast.  RADIATION DOSE REDUCTION: This exam was performed according to the departmental dose-optimization program which includes automated exposure control, adjustment of the mA and/or kV according to patient size and/or use of iterative reconstruction technique. COMPARISON:  MRI brain 05/29/2020. FINDINGS: Brain: There is moderately advanced cerebral atrophy, small-vessel disease and atrophic ventriculomegaly, with unremarkable cerebellum and brainstem. There is a partially empty sella. No cortical based acute infarct, hemorrhage, mass or mass effect is seen with CT. There is no midline shift. Basal cisterns are clear. Vascular: There are calcific plaques in both distal vertebral arteries and both siphons. No hyperdense central vessel is seen. Skull: Negative for fractures or focal lesions. Sinuses/Orbits: There is mild membrane thickening in the maxillary sinuses. Other paranasal sinuses, bilateral mastoid air cells, and middle ears are clear. The nasal septum is midline. Old lens replacements noted with otherwise negative orbits. Other: None. IMPRESSION: 1. No acute intracranial CT findings. 2. Atrophy and small-vessel disease. Slightly interval progressed since 2021. 3. Sinus membrane disease. Electronically Signed   By: Francis Quam M.D.   On: 08/13/2024 22:16   DG Chest Port 1 View Result Date: 08/13/2024 CLINICAL DATA:  Questionable sepsis - evaluate for abnormality EXAM: PORTABLE CHEST 1 VIEW COMPARISON:  None Available. FINDINGS: The heart and mediastinal contours are within normal limits. No focal consolidation. No pulmonary edema. No pleural effusion. No pneumothorax. No acute osseous abnormality. IMPRESSION: No active disease. Electronically Signed   By: Morgane  Naveau M.D.   On: 08/13/2024 22:12      Subjective: Significant events overnight as discussed with staff, wife at bedside, reports patient is back at baseline  Discharge Exam: Vitals:   08/15/24 2343 08/16/24 0000  BP: 133/82   Pulse:     Resp:    Temp: 97.6 F (36.4 C) 97.8 F (36.6 C)  SpO2:     Vitals:   08/15/24 1700 08/15/24 2045 08/15/24 2343 08/16/24 0000  BP: 136/70 (!) 142/88 133/82   Pulse: 71     Resp: (!) 22     Temp:  99.5 F (37.5 C) 97.6 F (36.4 C) 97.8 F (36.6 C)  TempSrc:  Oral Oral Oral  SpO2: 97%     Weight:      Height:        General: Pt is alert, awake, pleasant, has dense aphasia at baseline Cardiovascular: RRR, S1/S2 +, no rubs, no gallops Respiratory: CTA bilaterally, no wheezing, no rhonchi Abdominal: Soft, NT, ND, bowel sounds + Extremities: Right lower extremity edema/knee edema much improved    The results of significant diagnostics from this hospitalization (including imaging, microbiology, ancillary and laboratory) are listed below for reference.     Microbiology: Recent Results (from the past 240 hours)  Blood Culture (routine x 2)     Status: None (Preliminary result)   Collection Time: 08/13/24  8:20 PM   Specimen: BLOOD LEFT FOREARM  Result Value Ref Range Status   Specimen Description BLOOD LEFT FOREARM  Final   Special Requests   Final  BOTTLES DRAWN AEROBIC AND ANAEROBIC Blood Culture adequate volume   Culture   Final    NO GROWTH 3 DAYS Performed at [facility], [address] [place], [place] [address]    Report Status PENDING  Incomplete  Blood Culture (routine x 2)     Status: None (Preliminary result)   Collection Time: 08/13/24  8:25 PM   Specimen: BLOOD  Result Value Ref Range Status   Specimen Description BLOOD RIGHT ANTECUBITAL  Final   Special Requests   Final    BOTTLES DRAWN AEROBIC AND ANAEROBIC Blood Culture results may not be optimal due to an inadequate volume of blood received in culture bottles   Culture   Final    NO GROWTH 3 DAYS Performed at [facility], [address] [place], [place] [address]    Report Status PENDING  Incomplete  Resp panel by RT-PCR (RSV, Flu A&B, Covid)     Status: None   Collection Time:  08/13/24  9:34 PM   Specimen: Nasal Swab  Result Value Ref Range Status   SARS Coronavirus 2 by RT PCR NEGATIVE NEGATIVE Final   Influenza A by PCR NEGATIVE NEGATIVE Final   Influenza B by PCR NEGATIVE NEGATIVE Final    Comment: (NOTE) The Xpert Xpress SARS-CoV-2/FLU/RSV plus assay is intended as an aid in the diagnosis of influenza from Nasopharyngeal swab specimens and should not be used as a sole basis for treatment. Nasal washings and aspirates are unacceptable for Xpert Xpress SARS-CoV-2/FLU/RSV testing.  Fact Sheet for Patients: BloggerCourse.com  Fact Sheet for Healthcare Providers: SeriousBroker.it  This test is not yet approved or cleared by the [place]  FDA and has been authorized for detection and/or diagnosis of SARS-CoV-2 by FDA under an Emergency Use Authorization (EUA). This EUA will remain in effect (meaning this test can be used) for the duration of the COVID-19 declaration under Section 564(b)(1) of the Act, 21 U.S.C. section 360bbb-3(b)(1), unless the authorization is terminated or revoked.     Resp Syncytial Virus by PCR NEGATIVE NEGATIVE Final    Comment: (NOTE) Fact Sheet for Patients: BloggerCourse.com  Fact Sheet for Healthcare Providers: SeriousBroker.it  This test is not yet approved or cleared by the [place]  FDA and has been authorized for detection and/or diagnosis of SARS-CoV-2 by FDA under an Emergency Use Authorization (EUA). This EUA will remain in effect (meaning this test can be used) for the duration of the COVID-19 declaration under Section 564(b)(1) of the Act, 21 U.S.C. section 360bbb-3(b)(1), unless the authorization is terminated or revoked.  Performed at [facility], [address] [place], [place] [address]   Body fluid culture w Gram Stain     Status: None (Preliminary result)   Collection Time: 08/14/24  4:12  PM   Specimen: Synovium; Body Fluid  Result Value Ref Range Status   Specimen Description SYNOVIAL RIGHT KNEE  Final   Special Requests NONE  Final   Gram Stain   Final    FEW WBC PRESENT, PREDOMINANTLY PMN NO ORGANISMS SEEN    Culture   Final    NO GROWTH 2 DAYS Performed at [facility], [address], [place], [place] [address]    Report Status PENDING  Incomplete     Labs: BNP (last 3 results) No results for input(s): BNP in the last 8760 hours. Basic Metabolic Panel: Recent Labs  Lab 08/13/24 2025 08/14/24 0550 08/15/24 1009 08/16/24 0428  NA 138 139 133* 138  K 4.2 3.9 3.9 4.5  CL 103 108 106 104  CO2 24 21* 18* 25  GLUCOSE 137* 105* 124* 89  BUN 20 18 18 16   CREATININE 1.43* 1.24 1.19 1.29*  CALCIUM  9.4 9.2 8.6* 9.1   Liver Function Tests: Recent Labs  Lab 08/13/24 2025 08/14/24 0550  AST 28 28  ALT 28 26  ALKPHOS 92 81  BILITOT 1.3* 1.5*  PROT 7.3 6.8  ALBUMIN 3.4* 3.0*   No results for input(s): LIPASE, AMYLASE in the last 168 hours. No results for input(s): AMMONIA in the last 168 hours. CBC: Recent Labs  Lab 08/13/24 2025 08/14/24 0550 08/15/24 1009 08/16/24 0428  WBC 8.7 8.2 9.3 6.3  NEUTROABS 6.4  --   --   --   HGB 15.1 14.4 14.6 13.9  HCT 45.4 41.6 42.8 41.4  MCV 101.3* 99.5 97.5 99.8  PLT 269 232 278 290   Cardiac Enzymes: No results for input(s): CKTOTAL, CKMB, CKMBINDEX, TROPONINI in the last 168 hours. BNP: Invalid input(s): POCBNP CBG: No results for input(s): GLUCAP in the last 168 hours. D-Dimer No results for input(s): DDIMER in the last 72 hours. Hgb A1c No results for input(s): HGBA1C in the last 72 hours. Lipid Profile No results for input(s): CHOL, HDL, LDLCALC, TRIG, CHOLHDL, LDLDIRECT in the last 72 hours. Thyroid function studies No results for input(s): TSH, T4TOTAL, T3FREE, THYROIDAB in the last 72 hours.  Invalid input(s): FREET3 Anemia work up No  results for input(s): VITAMINB12, FOLATE, FERRITIN, TIBC, IRON, RETICCTPCT in the last 72 hours. Urinalysis    Component Value Date/Time   COLORURINE YELLOW 08/14/2024 0142   APPEARANCEUR CLEAR 08/14/2024 0142   LABSPEC 1.026 08/14/2024 0142   PHURINE 6.0 08/14/2024 0142   GLUCOSEU NEGATIVE 08/14/2024 0142   HGBUR NEGATIVE 08/14/2024 0142   BILIRUBINUR NEGATIVE 08/14/2024 0142   KETONESUR 5 (A) 08/14/2024 0142   PROTEINUR NEGATIVE 08/14/2024 0142   UROBILINOGEN 1.0 10/25/2008 0955   NITRITE NEGATIVE 08/14/2024 0142   LEUKOCYTESUR NEGATIVE 08/14/2024 0142   Sepsis Labs Recent Labs  Lab 08/13/24 2025 08/14/24 0550 08/15/24 1009 08/16/24 0428  WBC 8.7 8.2 9.3 6.3   Microbiology Recent Results (from the past 240 hours)  Blood Culture (routine x 2)     Status: None (Preliminary result)   Collection Time: 08/13/24  8:20 PM   Specimen: BLOOD LEFT FOREARM  Result Value Ref Range Status   Specimen Description BLOOD LEFT FOREARM  Final   Special Requests   Final    BOTTLES DRAWN AEROBIC AND ANAEROBIC Blood Culture adequate volume   Culture   Final    NO GROWTH 3 DAYS Performed at [facility], [address], [place], [place] [address]    Report Status PENDING  Incomplete  Blood Culture (routine x 2)     Status: None (Preliminary result)   Collection Time: 08/13/24  8:25 PM   Specimen: BLOOD  Result Value Ref Range Status   Specimen Description BLOOD RIGHT ANTECUBITAL  Final   Special Requests   Final    BOTTLES DRAWN AEROBIC AND ANAEROBIC Blood Culture results may not be optimal due to an inadequate volume of blood received in culture bottles   Culture   Final    NO GROWTH 3 DAYS Performed at [facility], [address] [place], [place] [address]    Report Status PENDING  Incomplete  Resp panel by RT-PCR (RSV, Flu A&B, Covid)     Status: None  Collection Time: 08/13/24  9:34 PM   Specimen: Nasal Swab  Result Value Ref Range Status   SARS  Coronavirus 2 by RT PCR NEGATIVE NEGATIVE Final   Influenza A by PCR NEGATIVE NEGATIVE Final   Influenza B by PCR NEGATIVE NEGATIVE Final    Comment: (NOTE) The Xpert Xpress SARS-CoV-2/FLU/RSV plus assay is intended as an aid in the diagnosis of influenza from Nasopharyngeal swab specimens and should not be used as a sole basis for treatment. Nasal washings and aspirates are unacceptable for Xpert Xpress SARS-CoV-2/FLU/RSV testing.  Fact Sheet for Patients: BloggerCourse.com  Fact Sheet for Healthcare Providers: SeriousBroker.it  This test is not yet approved or cleared by the [place]  FDA and has been authorized for detection and/or diagnosis of SARS-CoV-2 by FDA under an Emergency Use Authorization (EUA). This EUA will remain in effect (meaning this test can be used) for the duration of the COVID-19 declaration under Section 564(b)(1) of the Act, 21 U.S.C. section 360bbb-3(b)(1), unless the authorization is terminated or revoked.     Resp Syncytial Virus by PCR NEGATIVE NEGATIVE Final    Comment: (NOTE) Fact Sheet for Patients: BloggerCourse.com  Fact Sheet for Healthcare Providers: SeriousBroker.it  This test is not yet approved or cleared by the [place]  FDA and has been authorized for detection and/or diagnosis of SARS-CoV-2 by FDA under an Emergency Use Authorization (EUA). This EUA will remain in effect (meaning this test can be used) for the duration of the COVID-19 declaration under Section 564(b)(1) of the Act, 21 U.S.C. section 360bbb-3(b)(1), unless the authorization is terminated or revoked.  Performed at [facility], [address], [place], [place] [address]   Body fluid culture w Gram Stain     Status: None (Preliminary result)   Collection Time: 08/14/24  4:12 PM   Specimen: Synovium; Body Fluid  Result Value Ref Range Status   Specimen  Description SYNOVIAL RIGHT KNEE  Final   Special Requests NONE  Final   Gram Stain   Final    FEW WBC PRESENT, PREDOMINANTLY PMN NO ORGANISMS SEEN    Culture   Final    NO GROWTH 2 DAYS Performed at [facility], [address], [place], [place] [address]    Report Status PENDING  Incomplete     Time coordinating discharge: Over 30 minutes  SIGNED:   Brayton Lye, MD  [facility] 08/16/2024, 10:20 AM Pager   If 7PM-7AM, please contact night-coverage [email or web address]

## 2024-08-16 NOTE — TOC Transition Note (Addendum)
 Transition of Care [facility]) - Discharge Note   Patient Details  Name: Blake Atkins MRN: [id number] Date of Birth: 1941-10-17  [facility]) CM/SW Contact:  Corean JAYSON Canary, RN Phone Number: 08/16/2024, 11:22 AM   Clinical Narrative:     Met with patient and wife at bedside introduced self and role. Patient and wife both agreeable to home health. They have not had this before. Gave them Medicare.gov choice to look over. DME not in room yet. Called [employer] and it was dropped off in room.  Will return for their choice for home health orders in 1200 Wife made choice for [facility] Messaged with Arna from [facility] for acceptance.   1214 [facility] accepted for home health orders in patient will be discharged with wife.  Barriers to Discharge: Continued Medical Work up   Patient Goals and CMS Choice Patient states their goals for this hospitalization and ongoing recovery are:: To get better and go home          Discharge Placement                       Discharge Plan and Services Additional resources added to the After Visit Summary for       Post Acute Care Choice: Home Health          DME Arranged: Bedside commode, Lightweight manual wheelchair with seat cushion DME Agency: [employer] Date DME Agency Contacted: 08/15/24 Time DME Agency Contacted: 1459 Representative spoke with at DME Agency: [place]            Social Drivers of Health (SDOH) Interventions SDOH Screenings   Food Insecurity: No Food Insecurity (08/14/2024)  Housing: Low Risk  (08/14/2024)  Transportation Needs: No Transportation Needs (08/14/2024)  Utilities: Not At Risk (08/14/2024)  Social Connections: Socially Integrated (08/14/2024)  Tobacco Use: Low Risk  (08/14/2024)     Readmission Risk Interventions     No data to display

## 2024-08-16 NOTE — Discharge Instructions (Signed)
 Follow with Primary MD Loreli Elsie JONETTA [name], MD in 7 days   Get CBC, CMP, checked  by Primary MD next visit.    Activity: As tolerated with Full fall precautions use walker/cane & assistance as needed   Disposition Home    Diet: Heart Healthy    On your next visit with your primary care physician please Get Medicines reviewed and adjusted.   Please request your Prim.MD to go over all Hospital Tests and Procedure/Radiological results at the follow up, please get all Hospital records sent to your Prim MD by signing hospital release before you go home.   If you experience worsening of your admission symptoms, develop shortness of breath, life threatening emergency, suicidal or homicidal thoughts you must seek medical attention immediately by calling 911 or calling your MD immediately  if symptoms less severe.  You Must read complete instructions/literature along with all the possible adverse reactions/side effects for all the Medicines you take and that have been prescribed to you. Take any new Medicines after you have completely understood and accpet all the possible adverse reactions/side effects.   Do not drive, operating heavy machinery, perform activities at heights, swimming or participation in water activities or provide baby sitting services if your were admitted for syncope or siezures until you have seen by Primary MD or a Neurologist and advised to do so again.  Do not drive when taking Pain medications.    Do not take more than prescribed Pain, Sleep and Anxiety Medications  Special Instructions: If you have smoked or chewed Tobacco  in the last 2 yrs please stop smoking, stop any regular Alcohol   and or any Recreational drug use.  Wear Seat belts while driving.   Please note  You were cared for by a hospitalist during your hospital stay. If you have any questions about your discharge medications or the care you received while you were in the hospital after you are  discharged, you can call the unit and asked to speak with the hospitalist on call if the hospitalist that took care of you is not available. Once you are discharged, your primary care physician will handle any further medical issues. Please note that NO REFILLS for any discharge medications will be authorized once you are discharged, as it is imperative that you return to your primary care physician (or establish a relationship with a primary care physician if you do not have one) for your aftercare needs so that they can reassess your need for medications and monitor your lab values.

## 2024-08-17 DIAGNOSIS — G9341 Metabolic encephalopathy: Secondary | ICD-10-CM | POA: Diagnosis not present

## 2024-08-17 DIAGNOSIS — T8489XD Other specified complication of internal orthopedic prosthetic devices, implants and grafts, subsequent encounter: Secondary | ICD-10-CM | POA: Diagnosis not present

## 2024-08-17 DIAGNOSIS — I081 Rheumatic disorders of both mitral and tricuspid valves: Secondary | ICD-10-CM | POA: Diagnosis not present

## 2024-08-17 DIAGNOSIS — M1711 Unilateral primary osteoarthritis, right knee: Secondary | ICD-10-CM | POA: Diagnosis not present

## 2024-08-17 DIAGNOSIS — N1832 Chronic kidney disease, stage 3b: Secondary | ICD-10-CM | POA: Diagnosis not present

## 2024-08-17 DIAGNOSIS — G309 Alzheimer's disease, unspecified: Secondary | ICD-10-CM | POA: Diagnosis not present

## 2024-08-17 DIAGNOSIS — F0280 Dementia in other diseases classified elsewhere without behavioral disturbance: Secondary | ICD-10-CM | POA: Diagnosis not present

## 2024-08-17 DIAGNOSIS — I129 Hypertensive chronic kidney disease with stage 1 through stage 4 chronic kidney disease, or unspecified chronic kidney disease: Secondary | ICD-10-CM | POA: Diagnosis not present

## 2024-08-17 DIAGNOSIS — M10361 Gout due to renal impairment, right knee: Secondary | ICD-10-CM | POA: Diagnosis not present

## 2024-08-17 LAB — BODY FLUID CULTURE W GRAM STAIN: Culture: NO GROWTH

## 2024-08-18 LAB — CULTURE, BLOOD (ROUTINE X 2)
Culture: NO GROWTH
Culture: NO GROWTH
Special Requests: ADEQUATE

## 2024-08-20 DIAGNOSIS — G9341 Metabolic encephalopathy: Secondary | ICD-10-CM | POA: Diagnosis not present

## 2024-08-20 DIAGNOSIS — I081 Rheumatic disorders of both mitral and tricuspid valves: Secondary | ICD-10-CM | POA: Diagnosis not present

## 2024-08-20 DIAGNOSIS — M1711 Unilateral primary osteoarthritis, right knee: Secondary | ICD-10-CM | POA: Diagnosis not present

## 2024-08-20 DIAGNOSIS — G309 Alzheimer's disease, unspecified: Secondary | ICD-10-CM | POA: Diagnosis not present

## 2024-08-20 DIAGNOSIS — M10361 Gout due to renal impairment, right knee: Secondary | ICD-10-CM | POA: Diagnosis not present

## 2024-08-20 DIAGNOSIS — F0280 Dementia in other diseases classified elsewhere without behavioral disturbance: Secondary | ICD-10-CM | POA: Diagnosis not present

## 2024-08-20 DIAGNOSIS — T8489XD Other specified complication of internal orthopedic prosthetic devices, implants and grafts, subsequent encounter: Secondary | ICD-10-CM | POA: Diagnosis not present

## 2024-08-20 DIAGNOSIS — N1832 Chronic kidney disease, stage 3b: Secondary | ICD-10-CM | POA: Diagnosis not present

## 2024-08-20 DIAGNOSIS — I129 Hypertensive chronic kidney disease with stage 1 through stage 4 chronic kidney disease, or unspecified chronic kidney disease: Secondary | ICD-10-CM | POA: Diagnosis not present

## 2024-08-21 DIAGNOSIS — F0280 Dementia in other diseases classified elsewhere without behavioral disturbance: Secondary | ICD-10-CM | POA: Diagnosis not present

## 2024-08-21 DIAGNOSIS — I129 Hypertensive chronic kidney disease with stage 1 through stage 4 chronic kidney disease, or unspecified chronic kidney disease: Secondary | ICD-10-CM | POA: Diagnosis not present

## 2024-08-21 DIAGNOSIS — G300 Alzheimer's disease with early onset: Secondary | ICD-10-CM | POA: Diagnosis not present

## 2024-08-21 DIAGNOSIS — N1832 Chronic kidney disease, stage 3b: Secondary | ICD-10-CM | POA: Diagnosis not present

## 2024-08-21 DIAGNOSIS — G9341 Metabolic encephalopathy: Secondary | ICD-10-CM | POA: Diagnosis not present

## 2024-08-21 DIAGNOSIS — I081 Rheumatic disorders of both mitral and tricuspid valves: Secondary | ICD-10-CM | POA: Diagnosis not present

## 2024-08-21 DIAGNOSIS — M1711 Unilateral primary osteoarthritis, right knee: Secondary | ICD-10-CM | POA: Diagnosis not present

## 2024-08-21 DIAGNOSIS — M109 Gout, unspecified: Secondary | ICD-10-CM | POA: Diagnosis not present

## 2024-08-21 DIAGNOSIS — T8489XD Other specified complication of internal orthopedic prosthetic devices, implants and grafts, subsequent encounter: Secondary | ICD-10-CM | POA: Diagnosis not present

## 2024-08-21 DIAGNOSIS — R4701 Aphasia: Secondary | ICD-10-CM | POA: Diagnosis not present

## 2024-08-21 DIAGNOSIS — F02B0 Dementia in other diseases classified elsewhere, moderate, without behavioral disturbance, psychotic disturbance, mood disturbance, and anxiety: Secondary | ICD-10-CM | POA: Diagnosis not present

## 2024-08-21 DIAGNOSIS — R748 Abnormal levels of other serum enzymes: Secondary | ICD-10-CM | POA: Diagnosis not present

## 2024-08-21 DIAGNOSIS — G9340 Encephalopathy, unspecified: Secondary | ICD-10-CM | POA: Diagnosis not present

## 2024-08-21 DIAGNOSIS — G309 Alzheimer's disease, unspecified: Secondary | ICD-10-CM | POA: Diagnosis not present

## 2024-08-21 DIAGNOSIS — M10361 Gout due to renal impairment, right knee: Secondary | ICD-10-CM | POA: Diagnosis not present

## 2024-08-22 DIAGNOSIS — N1832 Chronic kidney disease, stage 3b: Secondary | ICD-10-CM | POA: Diagnosis not present

## 2024-08-22 DIAGNOSIS — F0280 Dementia in other diseases classified elsewhere without behavioral disturbance: Secondary | ICD-10-CM | POA: Diagnosis not present

## 2024-08-22 DIAGNOSIS — T8489XD Other specified complication of internal orthopedic prosthetic devices, implants and grafts, subsequent encounter: Secondary | ICD-10-CM | POA: Diagnosis not present

## 2024-08-22 DIAGNOSIS — M1711 Unilateral primary osteoarthritis, right knee: Secondary | ICD-10-CM | POA: Diagnosis not present

## 2024-08-22 DIAGNOSIS — I081 Rheumatic disorders of both mitral and tricuspid valves: Secondary | ICD-10-CM | POA: Diagnosis not present

## 2024-08-22 DIAGNOSIS — G9341 Metabolic encephalopathy: Secondary | ICD-10-CM | POA: Diagnosis not present

## 2024-08-22 DIAGNOSIS — M10361 Gout due to renal impairment, right knee: Secondary | ICD-10-CM | POA: Diagnosis not present

## 2024-08-22 DIAGNOSIS — G309 Alzheimer's disease, unspecified: Secondary | ICD-10-CM | POA: Diagnosis not present

## 2024-08-22 DIAGNOSIS — I129 Hypertensive chronic kidney disease with stage 1 through stage 4 chronic kidney disease, or unspecified chronic kidney disease: Secondary | ICD-10-CM | POA: Diagnosis not present

## 2024-08-28 DIAGNOSIS — I129 Hypertensive chronic kidney disease with stage 1 through stage 4 chronic kidney disease, or unspecified chronic kidney disease: Secondary | ICD-10-CM | POA: Diagnosis not present

## 2024-08-28 DIAGNOSIS — I081 Rheumatic disorders of both mitral and tricuspid valves: Secondary | ICD-10-CM | POA: Diagnosis not present

## 2024-08-28 DIAGNOSIS — N1832 Chronic kidney disease, stage 3b: Secondary | ICD-10-CM | POA: Diagnosis not present

## 2024-08-28 DIAGNOSIS — G309 Alzheimer's disease, unspecified: Secondary | ICD-10-CM | POA: Diagnosis not present

## 2024-08-28 DIAGNOSIS — G9341 Metabolic encephalopathy: Secondary | ICD-10-CM | POA: Diagnosis not present

## 2024-08-28 DIAGNOSIS — M10361 Gout due to renal impairment, right knee: Secondary | ICD-10-CM | POA: Diagnosis not present

## 2024-08-28 DIAGNOSIS — T8489XD Other specified complication of internal orthopedic prosthetic devices, implants and grafts, subsequent encounter: Secondary | ICD-10-CM | POA: Diagnosis not present

## 2024-08-28 DIAGNOSIS — M1711 Unilateral primary osteoarthritis, right knee: Secondary | ICD-10-CM | POA: Diagnosis not present

## 2024-08-28 DIAGNOSIS — F0280 Dementia in other diseases classified elsewhere without behavioral disturbance: Secondary | ICD-10-CM | POA: Diagnosis not present

## 2024-08-30 DIAGNOSIS — T8489XD Other specified complication of internal orthopedic prosthetic devices, implants and grafts, subsequent encounter: Secondary | ICD-10-CM | POA: Diagnosis not present

## 2024-08-30 DIAGNOSIS — N1832 Chronic kidney disease, stage 3b: Secondary | ICD-10-CM | POA: Diagnosis not present

## 2024-08-30 DIAGNOSIS — G309 Alzheimer's disease, unspecified: Secondary | ICD-10-CM | POA: Diagnosis not present

## 2024-08-30 DIAGNOSIS — I081 Rheumatic disorders of both mitral and tricuspid valves: Secondary | ICD-10-CM | POA: Diagnosis not present

## 2024-08-30 DIAGNOSIS — M10361 Gout due to renal impairment, right knee: Secondary | ICD-10-CM | POA: Diagnosis not present

## 2024-08-30 DIAGNOSIS — F0280 Dementia in other diseases classified elsewhere without behavioral disturbance: Secondary | ICD-10-CM | POA: Diagnosis not present

## 2024-08-30 DIAGNOSIS — I129 Hypertensive chronic kidney disease with stage 1 through stage 4 chronic kidney disease, or unspecified chronic kidney disease: Secondary | ICD-10-CM | POA: Diagnosis not present

## 2024-08-30 DIAGNOSIS — G9341 Metabolic encephalopathy: Secondary | ICD-10-CM | POA: Diagnosis not present

## 2024-08-30 DIAGNOSIS — M1711 Unilateral primary osteoarthritis, right knee: Secondary | ICD-10-CM | POA: Diagnosis not present

## 2024-09-04 DIAGNOSIS — R748 Abnormal levels of other serum enzymes: Secondary | ICD-10-CM | POA: Diagnosis not present

## 2024-09-05 DIAGNOSIS — G9341 Metabolic encephalopathy: Secondary | ICD-10-CM | POA: Diagnosis not present

## 2024-09-05 DIAGNOSIS — T8489XD Other specified complication of internal orthopedic prosthetic devices, implants and grafts, subsequent encounter: Secondary | ICD-10-CM | POA: Diagnosis not present

## 2024-09-05 DIAGNOSIS — M10361 Gout due to renal impairment, right knee: Secondary | ICD-10-CM | POA: Diagnosis not present

## 2024-09-05 DIAGNOSIS — I129 Hypertensive chronic kidney disease with stage 1 through stage 4 chronic kidney disease, or unspecified chronic kidney disease: Secondary | ICD-10-CM | POA: Diagnosis not present

## 2024-09-05 DIAGNOSIS — I081 Rheumatic disorders of both mitral and tricuspid valves: Secondary | ICD-10-CM | POA: Diagnosis not present

## 2024-09-05 DIAGNOSIS — N1832 Chronic kidney disease, stage 3b: Secondary | ICD-10-CM | POA: Diagnosis not present

## 2024-09-05 DIAGNOSIS — F0280 Dementia in other diseases classified elsewhere without behavioral disturbance: Secondary | ICD-10-CM | POA: Diagnosis not present

## 2024-09-05 DIAGNOSIS — G309 Alzheimer's disease, unspecified: Secondary | ICD-10-CM | POA: Diagnosis not present

## 2024-09-05 DIAGNOSIS — M1711 Unilateral primary osteoarthritis, right knee: Secondary | ICD-10-CM | POA: Diagnosis not present

## 2024-09-10 DIAGNOSIS — I081 Rheumatic disorders of both mitral and tricuspid valves: Secondary | ICD-10-CM | POA: Diagnosis not present

## 2024-09-10 DIAGNOSIS — T8489XD Other specified complication of internal orthopedic prosthetic devices, implants and grafts, subsequent encounter: Secondary | ICD-10-CM | POA: Diagnosis not present

## 2024-09-10 DIAGNOSIS — M10361 Gout due to renal impairment, right knee: Secondary | ICD-10-CM | POA: Diagnosis not present

## 2024-09-10 DIAGNOSIS — I129 Hypertensive chronic kidney disease with stage 1 through stage 4 chronic kidney disease, or unspecified chronic kidney disease: Secondary | ICD-10-CM | POA: Diagnosis not present

## 2024-09-10 DIAGNOSIS — M1711 Unilateral primary osteoarthritis, right knee: Secondary | ICD-10-CM | POA: Diagnosis not present

## 2024-09-10 DIAGNOSIS — G309 Alzheimer's disease, unspecified: Secondary | ICD-10-CM | POA: Diagnosis not present

## 2024-09-10 DIAGNOSIS — G9341 Metabolic encephalopathy: Secondary | ICD-10-CM | POA: Diagnosis not present

## 2024-09-10 DIAGNOSIS — N1832 Chronic kidney disease, stage 3b: Secondary | ICD-10-CM | POA: Diagnosis not present

## 2024-09-10 DIAGNOSIS — F0280 Dementia in other diseases classified elsewhere without behavioral disturbance: Secondary | ICD-10-CM | POA: Diagnosis not present

## 2024-09-20 DIAGNOSIS — I129 Hypertensive chronic kidney disease with stage 1 through stage 4 chronic kidney disease, or unspecified chronic kidney disease: Secondary | ICD-10-CM | POA: Diagnosis not present

## 2024-09-20 DIAGNOSIS — F0280 Dementia in other diseases classified elsewhere without behavioral disturbance: Secondary | ICD-10-CM | POA: Diagnosis not present

## 2024-09-20 DIAGNOSIS — N1832 Chronic kidney disease, stage 3b: Secondary | ICD-10-CM | POA: Diagnosis not present

## 2024-09-20 DIAGNOSIS — T8489XD Other specified complication of internal orthopedic prosthetic devices, implants and grafts, subsequent encounter: Secondary | ICD-10-CM | POA: Diagnosis not present

## 2024-09-20 DIAGNOSIS — I081 Rheumatic disorders of both mitral and tricuspid valves: Secondary | ICD-10-CM | POA: Diagnosis not present

## 2024-09-20 DIAGNOSIS — G309 Alzheimer's disease, unspecified: Secondary | ICD-10-CM | POA: Diagnosis not present

## 2024-09-20 DIAGNOSIS — G9341 Metabolic encephalopathy: Secondary | ICD-10-CM | POA: Diagnosis not present

## 2024-09-20 DIAGNOSIS — M10361 Gout due to renal impairment, right knee: Secondary | ICD-10-CM | POA: Diagnosis not present

## 2024-09-20 DIAGNOSIS — M1711 Unilateral primary osteoarthritis, right knee: Secondary | ICD-10-CM | POA: Diagnosis not present

## 2024-09-25 DIAGNOSIS — M10361 Gout due to renal impairment, right knee: Secondary | ICD-10-CM | POA: Diagnosis not present

## 2024-09-25 DIAGNOSIS — I129 Hypertensive chronic kidney disease with stage 1 through stage 4 chronic kidney disease, or unspecified chronic kidney disease: Secondary | ICD-10-CM | POA: Diagnosis not present

## 2024-09-25 DIAGNOSIS — G9341 Metabolic encephalopathy: Secondary | ICD-10-CM | POA: Diagnosis not present

## 2024-09-25 DIAGNOSIS — T8489XD Other specified complication of internal orthopedic prosthetic devices, implants and grafts, subsequent encounter: Secondary | ICD-10-CM | POA: Diagnosis not present

## 2024-09-25 DIAGNOSIS — G309 Alzheimer's disease, unspecified: Secondary | ICD-10-CM | POA: Diagnosis not present

## 2024-09-25 DIAGNOSIS — N1832 Chronic kidney disease, stage 3b: Secondary | ICD-10-CM | POA: Diagnosis not present

## 2024-09-25 DIAGNOSIS — F0280 Dementia in other diseases classified elsewhere without behavioral disturbance: Secondary | ICD-10-CM | POA: Diagnosis not present

## 2024-09-25 DIAGNOSIS — M1711 Unilateral primary osteoarthritis, right knee: Secondary | ICD-10-CM | POA: Diagnosis not present

## 2024-09-28 DIAGNOSIS — I129 Hypertensive chronic kidney disease with stage 1 through stage 4 chronic kidney disease, or unspecified chronic kidney disease: Secondary | ICD-10-CM | POA: Diagnosis not present

## 2024-09-28 DIAGNOSIS — N1832 Chronic kidney disease, stage 3b: Secondary | ICD-10-CM | POA: Diagnosis not present

## 2024-09-28 DIAGNOSIS — F0280 Dementia in other diseases classified elsewhere without behavioral disturbance: Secondary | ICD-10-CM | POA: Diagnosis not present

## 2024-09-28 DIAGNOSIS — T8489XD Other specified complication of internal orthopedic prosthetic devices, implants and grafts, subsequent encounter: Secondary | ICD-10-CM | POA: Diagnosis not present

## 2024-09-28 DIAGNOSIS — M1711 Unilateral primary osteoarthritis, right knee: Secondary | ICD-10-CM | POA: Diagnosis not present

## 2024-09-28 DIAGNOSIS — M10361 Gout due to renal impairment, right knee: Secondary | ICD-10-CM | POA: Diagnosis not present

## 2024-09-28 DIAGNOSIS — G309 Alzheimer's disease, unspecified: Secondary | ICD-10-CM | POA: Diagnosis not present

## 2024-09-28 DIAGNOSIS — G9341 Metabolic encephalopathy: Secondary | ICD-10-CM | POA: Diagnosis not present

## 2024-10-05 DIAGNOSIS — M10361 Gout due to renal impairment, right knee: Secondary | ICD-10-CM | POA: Diagnosis not present

## 2024-10-09 DIAGNOSIS — G9341 Metabolic encephalopathy: Secondary | ICD-10-CM | POA: Diagnosis not present

## 2024-10-20 ENCOUNTER — Other Ambulatory Visit: Payer: Self-pay

## 2024-10-20 ENCOUNTER — Emergency Department (HOSPITAL_COMMUNITY)

## 2024-10-20 ENCOUNTER — Emergency Department (HOSPITAL_COMMUNITY)
Admission: EM | Admit: 2024-10-20 | Discharge: 2024-10-22 | Disposition: A | Discharge status: Home / Self Care | Attending: Emergency Medicine | Admitting: Emergency Medicine

## 2024-10-20 ENCOUNTER — Encounter (HOSPITAL_COMMUNITY): Payer: Self-pay

## 2024-10-20 DIAGNOSIS — R4701 Aphasia: Secondary | ICD-10-CM | POA: Diagnosis not present

## 2024-10-20 DIAGNOSIS — S0993XA Unspecified injury of face, initial encounter: Secondary | ICD-10-CM | POA: Diagnosis not present

## 2024-10-20 DIAGNOSIS — W19XXXA Unspecified fall, initial encounter: Secondary | ICD-10-CM | POA: Diagnosis not present

## 2024-10-20 DIAGNOSIS — F0390 Unspecified dementia without behavioral disturbance: Secondary | ICD-10-CM | POA: Diagnosis not present

## 2024-10-20 DIAGNOSIS — M5030 Other cervical disc degeneration, unspecified cervical region: Secondary | ICD-10-CM | POA: Diagnosis not present

## 2024-10-20 DIAGNOSIS — S0591XA Unspecified injury of right eye and orbit, initial encounter: Secondary | ICD-10-CM | POA: Diagnosis not present

## 2024-10-20 DIAGNOSIS — S0011XA Contusion of right eyelid and periocular area, initial encounter: Secondary | ICD-10-CM | POA: Insufficient documentation

## 2024-10-20 DIAGNOSIS — Y92009 Unspecified place in unspecified non-institutional (private) residence as the place of occurrence of the external cause: Secondary | ICD-10-CM | POA: Insufficient documentation

## 2024-10-20 DIAGNOSIS — S0083XA Contusion of other part of head, initial encounter: Secondary | ICD-10-CM | POA: Diagnosis not present

## 2024-10-20 DIAGNOSIS — M47812 Spondylosis without myelopathy or radiculopathy, cervical region: Secondary | ICD-10-CM | POA: Diagnosis not present

## 2024-10-20 DIAGNOSIS — Z7982 Long term (current) use of aspirin: Secondary | ICD-10-CM | POA: Insufficient documentation

## 2024-10-20 DIAGNOSIS — R220 Localized swelling, mass and lump, head: Secondary | ICD-10-CM | POA: Diagnosis not present

## 2024-10-20 DIAGNOSIS — R4182 Altered mental status, unspecified: Secondary | ICD-10-CM | POA: Diagnosis not present

## 2024-10-20 DIAGNOSIS — I6522 Occlusion and stenosis of left carotid artery: Secondary | ICD-10-CM | POA: Diagnosis not present

## 2024-10-20 DIAGNOSIS — R296 Repeated falls: Secondary | ICD-10-CM | POA: Diagnosis not present

## 2024-10-20 DIAGNOSIS — R0989 Other specified symptoms and signs involving the circulatory and respiratory systems: Secondary | ICD-10-CM | POA: Diagnosis not present

## 2024-10-20 DIAGNOSIS — S0990XA Unspecified injury of head, initial encounter: Secondary | ICD-10-CM | POA: Diagnosis present

## 2024-10-20 DIAGNOSIS — I6523 Occlusion and stenosis of bilateral carotid arteries: Secondary | ICD-10-CM | POA: Diagnosis not present

## 2024-10-20 LAB — CBC WITH DIFFERENTIAL/PLATELET
Abs Immature Granulocytes: 0.03 K/uL (ref 0.00–0.07)
Basophils Absolute: 0.1 K/uL (ref 0.0–0.1)
Basophils Relative: 1 %
Eosinophils Absolute: 0 K/uL (ref 0.0–0.5)
Eosinophils Relative: 1 %
HCT: 44.9 % (ref 39.0–52.0)
Hemoglobin: 15.2 g/dL (ref 13.0–17.0)
Immature Granulocytes: 0 %
Lymphocytes Relative: 24 %
Lymphs Abs: 1.6 K/uL (ref 0.7–4.0)
MCH: 33.7 pg (ref 26.0–34.0)
MCHC: 33.9 g/dL (ref 30.0–36.0)
MCV: 99.6 fL (ref 80.0–100.0)
Monocytes Absolute: 0.6 K/uL (ref 0.1–1.0)
Monocytes Relative: 9 %
Neutro Abs: 4.4 K/uL (ref 1.7–7.7)
Neutrophils Relative %: 65 %
Platelets: 242 K/uL (ref 150–400)
RBC: 4.51 MIL/uL (ref 4.22–5.81)
RDW: 12.4 % (ref 11.5–15.5)
WBC: 6.7 K/uL (ref 4.0–10.5)
nRBC: 0 % (ref 0.0–0.2)

## 2024-10-20 LAB — BASIC METABOLIC PANEL WITH GFR
Anion gap: 12 (ref 5–15)
BUN: 16 mg/dL (ref 8–23)
CO2: 24 mmol/L (ref 22–32)
Calcium: 9 mg/dL (ref 8.9–10.3)
Chloride: 100 mmol/L (ref 98–111)
Creatinine, Ser: 1.35 mg/dL — ABNORMAL HIGH (ref 0.61–1.24)
GFR, Estimated: 52 mL/min — ABNORMAL LOW (ref >60)
Glucose, Bld: 104 mg/dL — ABNORMAL HIGH (ref 70–99)
Potassium: 3.4 mmol/L — ABNORMAL LOW (ref 3.5–5.1)
Sodium: 136 mmol/L (ref 135–145)

## 2024-10-20 MED ORDER — DONEPEZIL HCL 5 MG PO TABS
10.0000 mg | ORAL_TABLET | Freq: Every day | ORAL | Status: DC
  Administered 2024-10-20 – 2024-10-21 (×2): 10 mg via ORAL
  Filled 2024-10-20 (×2): qty 2

## 2024-10-20 MED ORDER — ACETAMINOPHEN 500 MG PO TABS
1000.0000 mg | ORAL_TABLET | Freq: Three times a day (TID) | ORAL | Status: DC | PRN
  Administered 2024-10-20: 1000 mg via ORAL
  Filled 2024-10-20: qty 2

## 2024-10-20 MED ORDER — LORAZEPAM 1 MG PO TABS
1.0000 mg | ORAL_TABLET | Freq: Four times a day (QID) | ORAL | Status: DC | PRN
  Administered 2024-10-20 – 2024-10-21 (×3): 1 mg via ORAL
  Filled 2024-10-20 (×3): qty 1

## 2024-10-20 MED ORDER — ROSUVASTATIN CALCIUM 5 MG PO TABS
20.0000 mg | ORAL_TABLET | Freq: Every day | ORAL | Status: DC
  Administered 2024-10-20 – 2024-10-22 (×3): 20 mg via ORAL
  Filled 2024-10-20: qty 4
  Filled 2024-10-20: qty 1
  Filled 2024-10-20: qty 4

## 2024-10-20 MED ORDER — IRBESARTAN 300 MG PO TABS
150.0000 mg | ORAL_TABLET | Freq: Every day | ORAL | Status: DC
  Administered 2024-10-20 – 2024-10-22 (×3): 150 mg via ORAL
  Filled 2024-10-20 (×3): qty 1

## 2024-10-20 MED ORDER — ASPIRIN 81 MG PO TBEC
81.0000 mg | DELAYED_RELEASE_TABLET | Freq: Every day | ORAL | Status: DC
  Administered 2024-10-20 – 2024-10-22 (×3): 81 mg via ORAL
  Filled 2024-10-20 (×3): qty 1

## 2024-10-20 NOTE — NC FL2 (Signed)
 [place]  MEDICAID [id number] LEVEL OF CARE FORM     IDENTIFICATION  Patient Name: Blake Atkins Birthdate: [birth year] Sex: male Admission Date (Current Location): 10/20/2024  [facility] and [place] Number:  Producer, Television/film/video and Address:  [facility] [place], [address], [place], [place] [address]      Provider Number: [id number]  Attending Physician Name and Address:  Pamella Ozell LABOR, DO  Relative Name and Phone Number:  Tyshan, Enderle (Spouse)  [phone]    Current Level of Care: Hospital Recommended Level of Care: Skilled Nursing Facility Prior Approval Number:    Date Approved/Denied:   PASRR Number: [id number]  Discharge Plan: SNF    Current Diagnoses: Patient Active Problem List   Diagnosis Date Noted   Altered mental status 08/14/2024   New onset seizure (HCC) 08/14/2024   History of dementia 08/14/2024   History of expressive aphasia 08/14/2024   Essential hypertension 08/14/2024   CKD stage 3b, GFR 30-44 ml/min (HCC) 08/14/2024   Syncope 08/14/2024   Aftercare following surgery of the circulatory system, NEC 01/18/2014   Occlusion and stenosis of carotid artery without mention of cerebral infarction 12/23/2011    Orientation RESPIRATION BLADDER Height & Weight     Self  Normal Continent Weight: 190 lbs  Height:  5'9   BEHAVIORAL SYMPTOMS/MOOD NEUROLOGICAL BOWEL NUTRITION STATUS      Continent Diet (Regular)  AMBULATORY STATUS COMMUNICATION OF NEEDS Skin   Extensive Assist Verbally Normal                       Personal Care Assistance Level of Assistance  Bathing, Feeding, Dressing Bathing Assistance: Maximum assistance Feeding assistance: Independent Dressing Assistance: Maximum assistance     Functional Limitations Info  Sight, Hearing, Speech Sight Info: Adequate Hearing Info: Adequate Speech Info: Adequate    SPECIAL CARE FACTORS FREQUENCY  PT (By licensed PT)     PT Frequency: 5x weekly               Contractures Contractures Info: Not present    Additional Factors Info  Code Status, Allergies Code Status Info: Full Allergies Info: No known allergies listed           Current Medications (10/20/2024):  This is the current hospital active medication list Current Facility-Administered Medications  Medication Dose Route Frequency Provider Last Rate Last Admin   acetaminophen  (TYLENOL ) tablet 1,000 mg  1,000 mg Oral Q8H PRN Penna, Michael A, DO       aspirin  EC tablet 81 mg  81 mg Oral Daily Penna, Michael A, DO   81 mg at 10/20/24 1247   donepezil  (ARICEPT ) tablet 10 mg  10 mg Oral QHS Penna, Michael A, DO       irbesartan  (AVAPRO ) tablet 150 mg  150 mg Oral Daily Penna, Michael A, DO   150 mg at 10/20/24 1247   rosuvastatin  (CRESTOR ) tablet 20 mg  20 mg Oral Daily Penna, Michael A, DO   20 mg at 10/20/24 1247   Current Outpatient Medications  Medication Sig Dispense Refill   acetaminophen  (TYLENOL ) 500 MG tablet Take 1,000 mg by mouth every 8 (eight) hours as needed for moderate pain.     aspirin  EC 81 MG tablet Take 81 mg by mouth daily.     colchicine  0.6 MG tablet Take 1 tablet (0.6 mg total) by mouth daily for 10 days. 10 tablet 0   donepezil  (ARICEPT ) 10 MG tablet Take 10 mg by mouth  at bedtime.     Multiple Vitamins-Minerals (PRESERVISION AREDS 2) CAPS Take 1 capsule by mouth in the morning and at bedtime.     olmesartan (BENICAR) 20 MG tablet Take 20 mg by mouth daily.     Polyethyl Glyc-Propyl Glyc PF (SYSTANE ULTRA PF) 0.4-0.3 % SOLN Place 1 drop into both eyes 2 (two) times daily as needed (dry eyes).     rosuvastatin  (CRESTOR ) 20 MG tablet Take 20 mg by mouth daily.       Discharge Medications: Please see discharge summary for a list of discharge medications.  Relevant Imaging Results:  Relevant Lab Results:   Additional Information SSN: [id number]  Clotilda Brenner, LCSWA

## 2024-10-20 NOTE — ED Triage Notes (Signed)
 Patient BIB EMS from home, where he lives with his daughter, after multiple falls this week. Patient had one fall where he has a bruised eye from. Patient is not on thinners. Patient AMS at baseline. Patient has no complaints at this time.

## 2024-10-20 NOTE — TOC Initial Note (Signed)
 Transition of Care [facility]) - Initial/Assessment Note    Patient Details  Name: Blake Atkins MRN: [id number] Date of Birth: 09-20-1941  Transition [facility] Care [facility]) CM/SW Contact:    Clotilda Brenner, LCSWA Phone Number: 10/20/2024, 1:00 PM  Clinical Narrative:     CSW spoke with patient's wife at bedside. The patient's wife stated about 2 1/2 months ago they were discharged from the hospital with [facility] PT. CSW was told that PT was coming out once a week and patient was doing well and improving.  CSW was told that patient was discharged from their program about 3 weeks ago. The patient's wife stated there has been a decline and frequent falls. The patient's wife feels patient could benefit from rehab prior to coming back home. Patient's wife prefers [facility] nursing but is open to other facilities.    CSW contacted Randine in admissions at [facility]. Randine stated she could possibly have a bed available on [address]. CSW will send patients referral for review.           Expected Discharge Plan: Skilled Nursing Facility Barriers to Discharge: Continued Medical Work up   Patient Goals and CMS Choice Patient states their goals for this hospitalization and ongoing recovery are:: Skilled nursing placement          Expected Discharge Plan and Services       Living arrangements for the past 2 months: Single Family Home                                      Prior Living Arrangements/Services Living arrangements for the past 2 months: Single Family Home Lives with:: Spouse Patient language and need for interpreter reviewed:: Yes Do you feel safe going back to the place where you live?: Yes      Need for Family Participation in Patient Care: Yes (Comment) Care giver support system in place?: Yes (comment)   Criminal Activity/Legal Involvement Pertinent to Current Situation/Hospitalization: Yes - Comment as needed  Activities of Daily Living      Permission Sought/Granted Permission  sought to share information with : Facility Industrial/product Designer granted to share information with : Yes, Verbal Permission Granted  Share Information with NAME: Elizabeth Haff     Permission granted to share info w Relationship: Wife  Permission granted to share info w Contact Information: [phone]  Emotional Assessment Appearance:: Appears stated age Attitude/Demeanor/Rapport: Engaged Affect (typically observed): Calm Orientation: : Oriented to Self Alcohol  / Substance Use: Not Applicable Psych Involvement: No (comment)  Admission diagnosis:  fall Patient Active Problem List   Diagnosis Date Noted   Altered mental status 08/14/2024   New onset seizure (HCC) 08/14/2024   History of dementia 08/14/2024   History of expressive aphasia 08/14/2024   Essential hypertension 08/14/2024   CKD stage 3b, GFR 30-44 ml/min (HCC) 08/14/2024   Syncope 08/14/2024   Aftercare following surgery of the circulatory system, NEC 01/18/2014   Occlusion and stenosis of carotid artery without mention of cerebral infarction 12/23/2011   PCP:  Loreli Elsie JONETTA [name], MD Pharmacy:   [facility] [place], [place] - [address] [place] [address] Phone: [phone] Fax: [phone]  [facility] [address] [place] [address] Phone: [phone] Fax: [phone]     Social Drivers of Health (SDOH) Social History: SDOH Screenings   Food Insecurity:  No Food Insecurity (08/14/2024)  Housing: Low Risk  (08/14/2024)  Transportation Needs: No Transportation Needs (08/14/2024)  Utilities: Not At Risk (08/14/2024)  Social Connections: Socially Integrated (08/14/2024)  Tobacco Use: Low Risk  (10/20/2024)   SDOH Interventions:     Readmission Risk Interventions     No data to display

## 2024-10-20 NOTE — NC FL2 (Signed)
 [place]  MEDICAID FL2 LEVEL OF CARE FORM     IDENTIFICATION  Patient Name: Blake Atkins Birthdate: [birth year] Sex: male Admission Date (Current Location): 10/20/2024  [facility] and [place] Number:  Producer, Television/film/video and Address:  [facility] [place]. [facility], [address], [place], [place] [address]      Provider Number: [id number]  Attending Physician Name and Address:  Pamella Ozell LABOR, DO  Relative Name and Phone Number:  Thermon, Zulauf (Spouse)  [phone]    Current Level of Care: Hospital Recommended Level of Care: Skilled Nursing Facility Prior Approval Number:    Date Approved/Denied:   PASRR Number: [id number]  Discharge Plan: SNF    Current Diagnoses: Patient Active Problem List   Diagnosis Date Noted   Altered mental status 08/14/2024   New onset seizure (HCC) 08/14/2024   History of dementia 08/14/2024   History of expressive aphasia 08/14/2024   Essential hypertension 08/14/2024   CKD stage 3b, GFR 30-44 ml/min (HCC) 08/14/2024   Syncope 08/14/2024   Aftercare following surgery of the circulatory system, NEC 01/18/2014   Occlusion and stenosis of carotid artery without mention of cerebral infarction 12/23/2011    Orientation RESPIRATION BLADDER Height & Weight     Self  Normal Continent Weight:   Height:     BEHAVIORAL SYMPTOMS/MOOD NEUROLOGICAL BOWEL NUTRITION STATUS      Continent Diet (Regular)  AMBULATORY STATUS COMMUNICATION OF NEEDS Skin   Extensive Assist Verbally Normal                       Personal Care Assistance Level of Assistance  Bathing, Feeding, Dressing Bathing Assistance: Maximum assistance Feeding assistance: Independent Dressing Assistance: Maximum assistance     Functional Limitations Info  Sight, Hearing, Speech Sight Info: Adequate Hearing Info: Adequate Speech Info: Adequate    SPECIAL CARE FACTORS FREQUENCY  PT (By licensed PT)     PT Frequency: 5x weekly              Contractures  Contractures Info: Not present    Additional Factors Info  Code Status, Allergies Code Status Info: Full Allergies Info: No known allergies listed           Current Medications (10/20/2024):  This is the current hospital active medication list Current Facility-Administered Medications  Medication Dose Route Frequency Provider Last Rate Last Admin   acetaminophen  (TYLENOL ) tablet 1,000 mg  1,000 mg Oral Q8H PRN Penna, Michael A, DO       aspirin  EC tablet 81 mg  81 mg Oral Daily Penna, Michael A, DO   81 mg at 10/20/24 1247   donepezil  (ARICEPT ) tablet 10 mg  10 mg Oral QHS Penna, Michael A, DO       irbesartan  (AVAPRO ) tablet 150 mg  150 mg Oral Daily Penna, Michael A, DO   150 mg at 10/20/24 1247   rosuvastatin  (CRESTOR ) tablet 20 mg  20 mg Oral Daily Penna, Michael A, DO   20 mg at 10/20/24 1247   Current Outpatient Medications  Medication Sig Dispense Refill   acetaminophen  (TYLENOL ) 500 MG tablet Take 1,000 mg by mouth every 8 (eight) hours as needed for moderate pain.     allopurinol (ZYLOPRIM) 100 MG tablet Take 100 mg by mouth daily.     aspirin  EC 81 MG tablet Take 81 mg by mouth daily.     colchicine  0.6 MG tablet Take 1 tablet (0.6 mg total) by mouth daily for 10 days. (Patient  taking differently: Take 0.6-1.2 mg by mouth daily as needed (Gout). Take two tablets 1.2 at the first sign of gout flare, take one tablet 0.6 mg one hour later.) 10 tablet 0   donepezil  (ARICEPT ) 10 MG tablet Take 10 mg by mouth at bedtime.     Multiple Vitamins-Minerals (PRESERVISION AREDS 2) CAPS Take 1 capsule by mouth in the morning and at bedtime.     olmesartan (BENICAR) 20 MG tablet Take 20 mg by mouth daily.     Polyethyl Glyc-Propyl Glyc PF (SYSTANE ULTRA PF) 0.4-0.3 % SOLN Place 1 drop into both eyes 2 (two) times daily as needed (dry eyes).     rosuvastatin  (CRESTOR ) 20 MG tablet Take 20 mg by mouth daily. (Patient not taking: Reported on 10/20/2024)       Discharge Medications: Please  see discharge summary for a list of discharge medications.  Relevant Imaging Results:  Relevant Lab Results:   Additional Information SSN: [id number]  Corean JAYSON Canary, RN

## 2024-10-20 NOTE — ED Notes (Signed)
 PT assisted to bedside commode with wife present, PT extremely ataxic and unable to follow commands. Urinated, no BM.

## 2024-10-20 NOTE — Care Management (Signed)
 Transition of Care [facility]) - Emergency Department Mini Assessment   Patient Details  Name: Blake Atkins MRN: [id number] Date of Birth: Feb 11, 1941  Transition of Care [facility]) CM/SW Contact:    Corean JAYSON Canary, RN Phone Number: 10/20/2024, 11:07 AM   Clinical Narrative: Patient with  history of dementia, lives at home with spouse. He has fallen several times this week, bruise noted on right cheek. H PING/Bamboo revealed he had [facility] and was discharged from that service on 10/09/2024 Met at bedside with patient and spouse.  Spouse explained that he was doing well with home health they are pleased with [facility], if he is recommended home health again, she would be fine with having them back. He had a wheelchair and bedside comoode from [facility] from last admission, however they sent the wheelchair back because he was not using it. He has a cane and walker but doesn't use them either. When he falls she cannot pick him up and has to call the squad. She is open to SNF for short stay, she did hire aides/ help that are coming starting Monday  Will await recommendations from PT  IP care management will follow ED Mini Assessment: What brought you to the Emergency Department? : Falls  Barriers to Discharge: Continued Medical Work up        Interventions which prevented an admission or readmission: Home Health Consult or Services    Patient Contact and Communications Key Contact 1: Dianna Estrin   Spoke with: Dianna at bedside  ,                 Admission diagnosis:  fall Patient Active Problem List   Diagnosis Date Noted   Altered mental status 08/14/2024   New onset seizure (HCC) 08/14/2024   History of dementia 08/14/2024   History of expressive aphasia 08/14/2024   Essential hypertension 08/14/2024   CKD stage 3b, GFR 30-44 ml/min (HCC) 08/14/2024   Syncope 08/14/2024   Aftercare following surgery of the circulatory system, NEC 01/18/2014   Occlusion and  stenosis of carotid artery without mention of cerebral infarction 12/23/2011   PCP:  Loreli Elsie JONETTA [name], MD Pharmacy:   [facility] - [place], [place] - [address] [place] [address] Phone: [phone] Fax: [phone]  [facility] [address] [place] [address] Phone: [phone] Fax: [phone]

## 2024-10-20 NOTE — ED Notes (Signed)
Called for a lunch tray.

## 2024-10-20 NOTE — Progress Notes (Signed)
 CSW requested ED provider to sign FL2. Signature still pending at this time.

## 2024-10-20 NOTE — Evaluation (Signed)
 Physical Therapy Evaluation Patient Details Name: Blake Atkins MRN: [id number] DOB: 19-Sep-1941 Today's Date: 10/20/2024  History of Present Illness  Pt is an 83 y.o. male presenting 10/20/24 after fall (apparently had gotten up to use the bathroom--wife did not witness). This was pt's second fall in 24 hours per wife. All xrays and CT scans without evidence of fracture. PMH significant for dementia complicated by expressive aphasia, HTN, CVA, HLD, CKD III.  Clinical Impression   Pt admitted secondary to problem above with deficits below. PTA patient lives with wife in one-level home with 7 steps to enter with bil rails. She normally provides supervision to [facility] when pt is up mobilizing. He has had 2 falls in 24 hours. He does not use a device and sometimes uses HHA of wife.  Pt currently required min assist due to imbalance for sit to stand and ambulation. He required mod assist for bed mobility (on ED stretcher). He had one episode of staggering and tended to drift left more than right (especially when distracted and turning his head).  Anticipate patient will benefit from PT to address problems listed below. Will continue to follow acutely to maximize functional mobility, independence, and safety.  Patient will benefit from continued inpatient follow up therapy, <3 hours/day          If plan is discharge home, recommend the following: Two people to help with walking and/or transfers;Direct supervision/assist for medications management;Direct supervision/assist for financial management;Assist for transportation;Help with stairs or ramp for entrance;Supervision due to cognitive status   Can travel by private vehicle   Yes    Equipment Recommendations None recommended by PT  Recommendations for Other Services       Functional Status Assessment Patient has had a recent decline in their functional status and demonstrates the ability to make significant improvements in function in a reasonable  and predictable amount of time.     Precautions / Restrictions Precautions Precautions: Fall Recall of Precautions/Restrictions: Impaired      Mobility  Bed Mobility Overal bed mobility: Needs Assistance Bed Mobility: Supine to Sit, Sit to Supine     Supine to sit: Mod assist, HOB elevated Sit to supine: Mod assist   General bed mobility comments: on stretcher in ED; pt initially ?not understanding cues, but once facilitated he joined in the motion    Transfers Overall transfer level: Needs assistance Equipment used: 2 person hand held assist Transfers: Sit to/from Stand Sit to Stand: Min assist, +2 physical assistance           General transfer comment: from stretcher; pt with imbalance and stagger step as coming to stand corrected with bil HHA    Ambulation/Gait Ambulation/Gait assistance: Min assist, +2 physical assistance Gait Distance (Feet): 100 Feet Assistive device: 2 person hand held assist Gait Pattern/deviations: Step-through pattern, Decreased stride length, Decreased step length - right, Drifts right/left   Gait velocity interpretation: 1.31 - 2.62 ft/sec, indicative of limited community ambulator   General Gait Details: varying step length (depending on his attention to task--easily distracted in ED); drifting requiring assist to maintain balance; assist to guide pt back to his stretcher  Stairs            Wheelchair Mobility     Tilt Bed    Modified Rankin (Stroke Patients Only)       Balance Overall balance assessment: Needs assistance Sitting-balance support: No upper extremity supported, Feet unsupported Sitting balance-Leahy Scale: Fair     Standing balance support: Bilateral upper  extremity supported Standing balance-Leahy Scale: Poor                               Pertinent Vitals/Pain Pain Assessment Pain Assessment: No/denies pain    Home Living Family/patient expects to be discharged to:: Private  residence Living Arrangements: Spouse/significant other Available Help at Discharge: Family;Available 24 hours/day Type of Home: House Home Access: Stairs to enter Entrance Stairs-Rails: Right;Left;Can reach both Entrance Stairs-Number of Steps: 7 (garage)   Home Layout: One level Home Equipment: Standard Walker;Cane - single point;Grab bars - tub/shower;Shower seat - built in;BSC/3in1      Prior Function Prior Level of Function : Needs assist             Mobility Comments: wife tries to always walk with him; no device and usually not holding his hand ADLs Comments: wife assists     Extremity/Trunk Assessment   Upper Extremity Assessment Upper Extremity Assessment: Overall WFL for tasks assessed    Lower Extremity Assessment Lower Extremity Assessment: Overall WFL for tasks assessed    Cervical / Trunk Assessment Cervical / Trunk Assessment: Normal  Communication   Communication Communication: Impaired Factors Affecting Communication: Hearing impaired;Difficulty expressing self (hearing aids not present)    Cognition Arousal: Alert Behavior During Therapy: Anxious   PT - Cognitive impairments: History of cognitive impairments                         Following commands: Impaired Following commands impaired: Follows one step commands inconsistently, Follows one step commands with increased time     Cueing Cueing Techniques: Verbal cues, Gestural cues, Tactile cues     General Comments General comments (skin integrity, edema, etc.): Wife present and provided history and assistance.    Exercises     Assessment/Plan    PT Assessment Patient needs continued PT services  PT Problem List Decreased balance;Decreased mobility;Decreased cognition;Decreased knowledge of use of DME;Decreased safety awareness;Decreased knowledge of precautions       PT Treatment Interventions DME instruction;Gait training;Stair training;Functional mobility  training;Therapeutic activities;Therapeutic exercise;Balance training;Cognitive remediation;Patient/family education    PT Goals (Current goals can be found in the Care Plan section)  Acute Rehab PT Goals Patient Stated Goal: pt with aphasia and unable to state; wife for pt to get more therapy and perhaps reduce fall risk PT Goal Formulation: Patient unable to participate in goal setting Time For Goal Achievement: 11/03/24 Potential to Achieve Goals: Good    Frequency Min 1X/week     Co-evaluation               AM-PAC PT 6 Clicks Mobility  Outcome Measure Help needed turning from your back to your side while in a flat bed without using bedrails?: A Little Help needed moving from lying on your back to sitting on the side of a flat bed without using bedrails?: A Lot Help needed moving to and from a bed to a chair (including a wheelchair)?: Total Help needed standing up from a chair using your arms (e.g., wheelchair or bedside chair)?: Total Help needed to walk in hospital room?: Total Help needed climbing 3-5 steps with a railing? : Total 6 Click Score: 9    End of Session Equipment Utilized During Treatment: Gait belt Activity Tolerance: Patient tolerated treatment well Patient left: in bed;with family/visitor present (on ED stretcher in hallway) Nurse Communication: Mobility status;Other (comment) (recommending post-acute therapies) PT Visit Diagnosis:  Repeated falls (R29.6);Other abnormalities of gait and mobility (R26.89)    Time: 8854-8784 PT Time Calculation (min) (ACUTE ONLY): 30 min   Charges:   PT Evaluation $PT Eval Low Complexity: 1 Low PT Treatments $Gait Training: 8-22 mins PT General Charges $$ ACUTE PT VISIT: 1 Visit          Macario RAMAN, PT Acute Rehabilitation Services  Office [phone]   Macario SHAUNNA Soja 10/20/2024, 12:36 PM

## 2024-10-20 NOTE — ED Provider Notes (Addendum)
 [place] EMERGENCY DEPARTMENT AT [facility] Provider Note   CSN: [id number] Arrival date & time: 10/20/24  9285     Patient presents with: Blake   Halton Atkins is a 83 y.o. male.  With a history of dementia and expressive aphasia who presents to the ED after fall.  Patient brought in by EMS from home where he had multiple falls in the past week.  Lives with his daughter.  Fell earlier this morning with obvious right periorbital facial trauma.  No anticoagulation.  Patient himself unable to contribute additional history secondary to expressive aphasia and dementia    Fall       Prior to Admission medications   Medication Sig Start Date End Date Taking? Authorizing Provider  acetaminophen  (TYLENOL ) 500 MG tablet Take 1,000 mg by mouth every 8 (eight) hours as needed for moderate pain.    [provider]  aspirin  EC 81 MG tablet Take 81 mg by mouth daily.    [provider]  colchicine  0.6 MG tablet Take 1 tablet (0.6 mg total) by mouth daily for 10 days. 08/16/24 08/26/24  Elgergawy, Brayton RAMAN, MD  donepezil  (ARICEPT ) 10 MG tablet Take 10 mg by mouth at bedtime.    [provider]  Multiple Vitamins-Minerals (PRESERVISION AREDS 2) CAPS Take 1 capsule by mouth in the morning and at bedtime. 05/27/18   [provider]  olmesartan (BENICAR) 20 MG tablet Take 20 mg by mouth daily. 04/30/22   [provider]  Polyethyl Glyc-Propyl Glyc PF (SYSTANE ULTRA PF) 0.4-0.3 % SOLN Place 1 drop into both eyes 2 (two) times daily as needed (dry eyes). 10/26/11   [provider]  rosuvastatin  (CRESTOR ) 20 MG tablet Take 20 mg by mouth daily. 10/26/11   [provider]    Allergies: Patient has no known allergies.    Review of Systems  Updated Vital Signs BP (!) 155/76 (BP Location: Right Arm)   Pulse 70   Temp 98 F (36.7 C) (Oral)   Resp 19   SpO2 100%   Physical Exam Vitals and nursing note reviewed.  HENT:      Head:     Comments: Right periorbital edema and ecchymosis with mild bony tenderness no deformity obvious Subconjunctival hemorrhage over temporal aspect of right eye No hyphema EOMI bilaterally Eyes:     Extraocular Movements: Extraocular movements intact.     Pupils: Pupils are equal, round, and reactive to light.  Cardiovascular:     Rate and Rhythm: Normal rate and regular rhythm.  Pulmonary:     Effort: Pulmonary effort is normal.     Breath sounds: Normal breath sounds.  Abdominal:     Palpations: Abdomen is soft.     Tenderness: There is no abdominal tenderness.  Musculoskeletal:     Cervical back: Neck supple. No tenderness.     Comments: No midline tenderness step-off deformity of back 5 out of 5 motor strength bilateral upper and lower extremities with full active range of motion Sensation intact to light touch throughout extremities  Skin:    General: Skin is warm and dry.  Neurological:     Mental Status: He is alert.     Comments: Oriented to self only Able to follow some commands with repeating  Psychiatric:        Mood and Affect: Mood normal.     (all labs ordered are listed, but only abnormal results are displayed) Labs Reviewed  BASIC METABOLIC PANEL WITH GFR -  Abnormal; Notable for the following components:      Result Value   Potassium 3.4 (*)    Glucose, Bld 104 (*)    Creatinine, Ser 1.35 (*)    GFR, Estimated 52 (*)    All other components within normal limits  CBC WITH DIFFERENTIAL/PLATELET    EKG: EKG Interpretation Date/Time:  Saturday October 20 2024 07:30:52 EDT Ventricular Rate:  56 PR Interval:  239 QRS Duration:  87 QT Interval:  437 QTC Calculation: 422 R Axis:   -1  Text Interpretation: Sinus rhythm Prolonged PR interval Borderline repolarization abnormality Confirmed by Pamella Sharper [id number]) on 10/20/2024 10:12:23 AM  Radiology: CT Cervical Spine Wo Contrast Result Date: 10/20/2024 EXAM: CT CERVICAL SPINE WITHOUT CONTRAST  10/20/2024 08:10:38 AM TECHNIQUE: CT of the cervical spine was performed without the administration of intravenous contrast. Multiplanar reformatted images are provided for review. Automated exposure control, iterative reconstruction, and/or weight based adjustment of the mA/kV was utilized to reduce the radiation dose to as low as reasonably achievable. COMPARISON: CT face and CT head today reported separately. CLINICAL HISTORY: 83 year old male. Neck trauma, multiple falls this week, bruised eye, AMS at baseline, no complaints. FINDINGS: CERVICAL SPINE: BONES AND ALIGNMENT: Maintained cervical lordosis. Normal bone mineralization for age. No acute fracture or traumatic malalignment. DEGENERATIVE CHANGES: Widespread degenerative cervical disc calcification. Calcified degenerative ligamentous hypertrophy about the odontoid. CT suggests mild degenerative cervical spinal stenosis. SOFT TISSUES: No prevertebral soft tissue swelling. Bulky left ICA calcified atherosclerosis. Negative visible noncontrast thoracic inlet. IMPRESSION: 1. No acute traumatic injury identified in the cervical spine. 2. Cervical disc and ligamentous degeneration. 3. left ICA atherosclerosis. Electronically signed by: Helayne Hurst MD 10/20/2024 08:25 AM EDT RP Workstation: [id number]   CT Maxillofacial Wo Contrast Result Date: 10/20/2024 EXAM: CT OF THE FACE WITHOUT CONTRAST 10/20/2024 08:10:38 AM TECHNIQUE: CT of the face was performed without the administration of intravenous contrast. Multiplanar reformatted images are provided for review. Automated exposure control, iterative reconstruction, and/or weight based adjustment of the mA/kV was utilized to reduce the radiation dose to as low as reasonably achievable. COMPARISON: Head CT reported separately today. CLINICAL HISTORY: 83 year old male. Facial trauma, blunt; R periorbital trauma. Patient BIB EMS from home, where he lives with his daughter, after multiple falls this week. Patient had  one fall where he has a bruised eye from. FINDINGS: FACIAL BONES: No acute facial fracture. No mandibular dislocation. No suspicious bone lesion. Underlying facial bones intact. ORBITS: Globes are intact. Postoperative changes to both globes. No acute traumatic injury. No inflammatory change. SINUSES AND MASTOIDS: Paranasal sinuses, middle ears and mastoids well aerated. SOFT TISSUES: Superficial right infraorbital, premalar soft tissue swelling and stranding on series 3 image 25. No soft tissue gas identified. VASCULATURE: Mild for age right carotid, moderate to severe left ICA calcified atherosclerosis in the visible neck. CERVICAL SPINE: Partially visible cervical spine degeneration. IMPRESSION: 1. Superficial right infraorbital soft tissue injury. 2. No facial bone fracture. 3. 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 Electronically signed by: Helayne Hurst MD 10/20/2024 08:23 AM EDT RP Workstation: [id number]   CT Head Wo Contrast Result Date: 10/20/2024 EXAM: CT HEAD WITHOUT CONTRAST 10/20/2024 08:10:38 AM TECHNIQUE: CT of the head was performed without the administration of intravenous contrast. Automated exposure control, iterative reconstruction, and/or weight based adjustment of the mA/kV was utilized to reduce the radiation dose to as low as reasonably achievable. COMPARISON: CT Head, 08/13/2024. CLINICAL HISTORY: 83 Year Old Male, Head  Trauma. FINDINGS: BRAIN AND VENTRICLES: Stable brain volume. Stable, patchy, and confluent cerebral white matter hypodensity, most pronounced in the left superior frontal gyrus posteriorly, perirolandic subcortical white matter. No convincing cortical encephalomalacia. No acute hemorrhage. No evidence of acute infarct. No hydrocephalus. No extra-axial collection. No mass effect or midline shift. ORBITS: No acute abnormality. SINUSES: No acute abnormality. SOFT TISSUES AND SKULL: No acute soft tissue abnormality. No skull fracture.  IMPRESSION: 1. No acute traumatic injury. 2. Stable white matter disease. Electronically signed by: Helayne Hurst MD 10/20/2024 08:19 AM EDT RP Workstation: [id number]   DG Pelvis Portable Result Date: 10/20/2024 EXAM: 1 or 2 VIEW(S) XRAY OF THE PELVIS 10/20/2024 07:53:00 AM COMPARISON: CT abdomen and pelvis 07/05/2023. CLINICAL HISTORY: 83 year old male with multiple falls this week, bruised eye, and AMS at baseline. FINDINGS: BONES AND JOINTS: No acute fracture. No focal osseous lesion. No joint dislocation. SOFT TISSUES: The soft tissues are unremarkable. ABDOMINAL CONTENTS: Negative visible bowel gas pattern. IMPRESSION: 1. No acute fracture or dislocation identified about the pelvis. Electronically signed by: Helayne Hurst MD 10/20/2024 08:03 AM EDT RP Workstation: [id number]   DG Chest Portable 1 View Result Date: 10/20/2024 EXAM: 1 VIEW XRAY OF THE CHEST 10/20/2024 07:53:00 AM COMPARISON: Portable chest x ray 08/13/2024. CLINICAL HISTORY: 83 year old male. Multiple falls this week, including one causing a bruised eye. Patient is not on thinners. AMS at baseline. No complaints. FINDINGS: LUNGS AND PLEURA: Lower lung volumes. No focal pulmonary opacity. No pulmonary edema. No pleural effusion. No pneumothorax. HEART AND MEDIASTINUM: No acute abnormality of the cardiac and mediastinal silhouettes. BONES AND SOFT TISSUES: No acute osseous abnormality. IMPRESSION: 1. lower lung volumes.  No acute cardiopulmonary abnormality. Electronically signed by: Helayne Hurst MD 10/20/2024 08:02 AM EDT RP Workstation: [id number]     Procedures   Medications Ordered in the ED  acetaminophen  (TYLENOL ) tablet 1,000 mg (has no administration in time range)  aspirin  EC tablet 81 mg (has no administration in time range)  donepezil  (ARICEPT ) tablet 10 mg (has no administration in time range)  irbesartan  (AVAPRO ) tablet 150 mg (has no administration in time range)  rosuvastatin  (CRESTOR ) tablet 20 mg (has no  administration in time range)    Clinical Course as of 10/20/24 1216  Sat Oct 20, 2024  0911 No acute traumatic findings.  Soft tissue edema in right supraorbital region.  Laboratory workup unremarkable with creatinine close to baseline.  No leukocytosis.  EKG without evidence of dysrhythmia.  Patient has remained stable here.  Discussed with wife at bedside.  He does have some home health services coming in but only for several hours a day.  She voices concern for repeated falls and she is physically unable to get him up from the ground when he falls.  No indication for admission but will obtain PT case management TOC evaluation here to see if there is anything else we can offer him at home or if he would benefit from potential rehabilitation placement [MP]  1215 Patient is been evaluated by PT.  Recommends skilled nursing facility placement.  Patient will continue boarding in the ED awaiting skilled nursing facility placemen.  Home medications ordered [MP]    Clinical Course User Index [MP] Pamella Ozell LABOR, DO                                 Medical Decision Making 83 year old male with history as above presenting after multiple falls  at home in the last week.  Lives with daughter.  Obvious facial trauma.  Right periorbital bruising with subcu conjunctival hemorrhage.  No evidence of entrapment or unstable fractures.  Hemodynamically stable.  No other overt evidence of trauma.  Appears to be at cognitive baseline however will need to obtain secondary history from daughter when she arrives.  Will look for any evidence of trauma with head CT facial CT CT cervical spine chest x-ray pelvis x-ray and obtain basic laboratory workup including CBC metabolic panel to look for underlying infection or metabolic derangement as cause for multiple falls.  Will add UA to look for UTI EKG to look for dysrhythmia and will also evaluate for pneumonia and chest x-ray.  Amount and/or Complexity of Data  Reviewed Labs: ordered. Radiology: ordered.  Risk OTC drugs. Prescription drug management.        Final diagnoses:  Fall in home, initial encounter  Contusion of face, initial encounter  Dementia without behavioral disturbance, psychotic disturbance, mood disturbance, or anxiety, unspecified dementia severity, unspecified dementia type [facility])    ED Discharge Orders     None          Pamella Ozell LABOR, DO 10/20/24 1151    Pamella Ozell A, DO 10/20/24 1216

## 2024-10-20 NOTE — ED Notes (Signed)
 Pt family member Dianna remains at bedside and plans to stay with pt overnight. Bed alarm on and safety socks on.

## 2024-10-21 MED ORDER — GUAIFENESIN 100 MG/5ML PO LIQD
5.0000 mL | Freq: Once | ORAL | Status: AC
  Administered 2024-10-21: 5 mL via ORAL
  Filled 2024-10-21: qty 5

## 2024-10-21 NOTE — ED Notes (Signed)
 Patient had a bedbath and complete bed change

## 2024-10-21 NOTE — ED Provider Notes (Addendum)
 Patient family wants to take him home.  They no longer want to try to continue to see for placement.  Social worker is request home PT face-to-face to be ordered.   Burle Kwan, MD 10/21/24 1532  Patient's wife has changed her mind about wanting to take him home.  She is left for the evening.  Will have them recontact the social worker to begin the placement process.    Bingham Millette, MD 10/21/24 [phone]

## 2024-10-21 NOTE — ED Provider Notes (Signed)
 Emergency Medicine Observation Re-evaluation Note  Berley Gambrell is a 83 y.o. male, seen on rounds today.  Pt initially presented to the ED for complaints of Fall Currently, the patient is TOC and awaiting nursing facility placement.  Patient's wife is at the bedside with him.  Physical Exam  BP (!) 148/92 (BP Location: Right Arm)   Pulse 61   Temp 98.1 F (36.7 C) (Oral)   Resp 20   SpO2 96%  Physical Exam General: Nontoxic no acute distress Cardiac:  Lungs: No respiratory distress Psych: Baseline  ED Course / MDM  EKG:EKG Interpretation Date/Time:  Saturday October 20 2024 07:30:52 EDT Ventricular Rate:  56 PR Interval:  239 QRS Duration:  87 QT Interval:  437 QTC Calculation: 422 R Axis:   -1  Text Interpretation: Sinus rhythm Prolonged PR interval Borderline repolarization abnormality Confirmed by Pamella Sharper [phone]) on 10/20/2024 10:12:23 AM  I have reviewed the labs performed to date as well as medications administered while in observation.  Recent changes in the last 24 hours include no significant events overnight social worker is working on placement.  I will see if the FL 2 is in my box for signing.  Plan  Current plan is for nursing facility placement.    Jazelle Achey, MD 10/21/24 1131

## 2024-10-21 NOTE — Discharge Instructions (Addendum)
 Home physical therapy has been arranged.  Follow-up with your doctor.  Cleared by child psychotherapist for discharge home.

## 2024-10-21 NOTE — Care Management (Addendum)
 Patient is going home with caregivers, would like PT to come from [facility]. Called [facility]  for acceptance

## 2024-10-21 NOTE — Progress Notes (Signed)
 CSW met with the patient's wife to explain bed offers. CSW explained to wife that she would need to look at medicare.gov to see about ratings. The wife did review and at this time has declined. The wife feels that the facility that accepted would not be appropriate for their family and she would just take the patient home. Wife reports they have LTC insurance and she will just get a caregiver to come help support. CSW has informed RN that the patient's wife would like to be DC home at this time. Patient would like home PT in which CSW has informed the CM. There are no other ICM needs at this time.    Blake Samba Cumba LCSW-A   10/21/2024 3:26 PM

## 2024-10-21 NOTE — ED Notes (Signed)
 Pt wife states that she is worried that she will be unable to care for patient at home. Pt wife requesting SW to look into rehab facilities. Zackowski, MD made aware, Corean, CM RN made aware.

## 2024-10-21 NOTE — ED Notes (Signed)
 [facility] contacted for pt transport back to home. Pt 6th on the list.

## 2024-10-22 NOTE — Progress Notes (Signed)
 Spoke c/pt's wife at bedside about level of care pt is requiring at home and whether or not she is able to meet his current need. Wife states that she is aware of increased need for supervision and is working [employer] who were scheduled to start personal aide services today. Wife states her husband has been approved for 24/7 aide.   Pt will discharge home today c/[facility] home health RN, PT, SW and a around-the-clock aide from [employer].

## 2024-10-22 NOTE — ED Provider Notes (Signed)
 Emergency Medicine Observation Re-evaluation Note  Blake Atkins is a 83 y.o. male, seen on rounds today.  Pt initially presented to the ED for complaints of Fall Currently, the patient is sleeping.  I spoke with the wife at bedside who states that he got agitated last night and they decided to stay here.  She is waiting to talk to social work about going home with home health versus rehab placement  Physical Exam  BP (!) 171/90 (BP Location: Right Wrist)   Pulse 66   Temp 98 F (36.7 C) (Oral)   Resp 18   SpO2 100%  Physical Exam General: Nad, sleeping Lungs: No respiratory distress Psych: Calm  ED Course / MDM  EKG:EKG Interpretation Date/Time:  Saturday October 20 2024 07:30:52 EDT Ventricular Rate:  56 PR Interval:  239 QRS Duration:  87 QT Interval:  437 QTC Calculation: 422 R Axis:   -1  Text Interpretation: Sinus rhythm Prolonged PR interval Borderline repolarization abnormality Confirmed by Pamella Sharper [phone]) on 10/20/2024 10:12:23 AM  I have reviewed the labs performed to date as well as medications administered while in observation.  Recent changes in the last 24 hours include agitation overnight requiring ativan.   Plan  Current plan is for SNF placement versus home with home health.     Gennaro Duwaine CROME, DO 10/22/24 [phone]

## 2024-10-22 NOTE — ED Notes (Signed)
 Went over AVS with wife today

## 2024-10-24 ENCOUNTER — Emergency Department (HOSPITAL_COMMUNITY)

## 2024-10-24 ENCOUNTER — Inpatient Hospital Stay (HOSPITAL_COMMUNITY)
Admission: EM | Admit: 2024-10-24 | Discharge: 2024-10-31 | DRG: 871 | Disposition: A | Discharge status: Home Health | Attending: Internal Medicine | Admitting: Internal Medicine

## 2024-10-24 ENCOUNTER — Other Ambulatory Visit: Payer: Self-pay

## 2024-10-24 DIAGNOSIS — J189 Pneumonia, unspecified organism: Secondary | ICD-10-CM | POA: Diagnosis present

## 2024-10-24 DIAGNOSIS — Z8659 Personal history of other mental and behavioral disorders: Secondary | ICD-10-CM

## 2024-10-24 DIAGNOSIS — M19011 Primary osteoarthritis, right shoulder: Secondary | ICD-10-CM | POA: Diagnosis not present

## 2024-10-24 DIAGNOSIS — R509 Fever, unspecified: Secondary | ICD-10-CM | POA: Diagnosis not present

## 2024-10-24 DIAGNOSIS — L8990 Pressure ulcer of unspecified site, unspecified stage: Secondary | ICD-10-CM | POA: Diagnosis not present

## 2024-10-24 DIAGNOSIS — R9089 Other abnormal findings on diagnostic imaging of central nervous system: Secondary | ICD-10-CM | POA: Diagnosis not present

## 2024-10-24 DIAGNOSIS — E785 Hyperlipidemia, unspecified: Secondary | ICD-10-CM | POA: Diagnosis present

## 2024-10-24 DIAGNOSIS — M47812 Spondylosis without myelopathy or radiculopathy, cervical region: Secondary | ICD-10-CM | POA: Diagnosis not present

## 2024-10-24 DIAGNOSIS — N1832 Chronic kidney disease, stage 3b: Secondary | ICD-10-CM | POA: Diagnosis not present

## 2024-10-24 DIAGNOSIS — G309 Alzheimer's disease, unspecified: Secondary | ICD-10-CM | POA: Diagnosis present

## 2024-10-24 DIAGNOSIS — A419 Sepsis, unspecified organism: Secondary | ICD-10-CM | POA: Diagnosis not present

## 2024-10-24 DIAGNOSIS — Z79899 Other long term (current) drug therapy: Secondary | ICD-10-CM | POA: Diagnosis not present

## 2024-10-24 DIAGNOSIS — R7401 Elevation of levels of liver transaminase levels: Secondary | ICD-10-CM | POA: Diagnosis not present

## 2024-10-24 DIAGNOSIS — F0280 Dementia in other diseases classified elsewhere without behavioral disturbance: Secondary | ICD-10-CM | POA: Diagnosis present

## 2024-10-24 DIAGNOSIS — I129 Hypertensive chronic kidney disease with stage 1 through stage 4 chronic kidney disease, or unspecified chronic kidney disease: Secondary | ICD-10-CM | POA: Diagnosis present

## 2024-10-24 DIAGNOSIS — M25461 Effusion, right knee: Secondary | ICD-10-CM | POA: Diagnosis not present

## 2024-10-24 DIAGNOSIS — M1711 Unilateral primary osteoarthritis, right knee: Secondary | ICD-10-CM | POA: Diagnosis not present

## 2024-10-24 DIAGNOSIS — J690 Pneumonitis due to inhalation of food and vomit: Secondary | ICD-10-CM | POA: Diagnosis present

## 2024-10-24 DIAGNOSIS — I2510 Atherosclerotic heart disease of native coronary artery without angina pectoris: Secondary | ICD-10-CM | POA: Diagnosis not present

## 2024-10-24 DIAGNOSIS — M11261 Other chondrocalcinosis, right knee: Secondary | ICD-10-CM | POA: Diagnosis not present

## 2024-10-24 DIAGNOSIS — R4701 Aphasia: Secondary | ICD-10-CM | POA: Diagnosis present

## 2024-10-24 DIAGNOSIS — M109 Gout, unspecified: Secondary | ICD-10-CM | POA: Diagnosis present

## 2024-10-24 DIAGNOSIS — R4182 Altered mental status, unspecified: Secondary | ICD-10-CM | POA: Diagnosis not present

## 2024-10-24 DIAGNOSIS — R410 Disorientation, unspecified: Secondary | ICD-10-CM | POA: Diagnosis not present

## 2024-10-24 DIAGNOSIS — R6520 Severe sepsis without septic shock: Secondary | ICD-10-CM | POA: Diagnosis not present

## 2024-10-24 DIAGNOSIS — K719 Toxic liver disease, unspecified: Secondary | ICD-10-CM | POA: Diagnosis not present

## 2024-10-24 DIAGNOSIS — Z801 Family history of malignant neoplasm of trachea, bronchus and lung: Secondary | ICD-10-CM | POA: Diagnosis not present

## 2024-10-24 DIAGNOSIS — Z66 Do not resuscitate: Secondary | ICD-10-CM | POA: Diagnosis not present

## 2024-10-24 DIAGNOSIS — M1120 Other chondrocalcinosis, unspecified site: Secondary | ICD-10-CM | POA: Diagnosis not present

## 2024-10-24 DIAGNOSIS — I10 Essential (primary) hypertension: Secondary | ICD-10-CM | POA: Diagnosis present

## 2024-10-24 DIAGNOSIS — I69320 Aphasia following cerebral infarction: Secondary | ICD-10-CM

## 2024-10-24 DIAGNOSIS — G9341 Metabolic encephalopathy: Secondary | ICD-10-CM | POA: Diagnosis not present

## 2024-10-24 DIAGNOSIS — Z1152 Encounter for screening for COVID-19: Secondary | ICD-10-CM | POA: Diagnosis not present

## 2024-10-24 DIAGNOSIS — M4802 Spinal stenosis, cervical region: Secondary | ICD-10-CM | POA: Diagnosis not present

## 2024-10-24 DIAGNOSIS — M25511 Pain in right shoulder: Secondary | ICD-10-CM | POA: Diagnosis present

## 2024-10-24 DIAGNOSIS — Z800 Family history of malignant neoplasm of digestive organs: Secondary | ICD-10-CM

## 2024-10-24 DIAGNOSIS — I719 Aortic aneurysm of unspecified site, without rupture: Secondary | ICD-10-CM | POA: Diagnosis not present

## 2024-10-24 DIAGNOSIS — G319 Degenerative disease of nervous system, unspecified: Secondary | ICD-10-CM | POA: Diagnosis not present

## 2024-10-24 DIAGNOSIS — Z833 Family history of diabetes mellitus: Secondary | ICD-10-CM

## 2024-10-24 DIAGNOSIS — E871 Hypo-osmolality and hyponatremia: Secondary | ICD-10-CM | POA: Diagnosis present

## 2024-10-24 DIAGNOSIS — M50221 Other cervical disc displacement at C4-C5 level: Secondary | ICD-10-CM | POA: Diagnosis not present

## 2024-10-24 DIAGNOSIS — Z7982 Long term (current) use of aspirin: Secondary | ICD-10-CM | POA: Diagnosis not present

## 2024-10-24 DIAGNOSIS — R29818 Other symptoms and signs involving the nervous system: Secondary | ICD-10-CM | POA: Diagnosis not present

## 2024-10-24 DIAGNOSIS — I6782 Cerebral ischemia: Secondary | ICD-10-CM | POA: Diagnosis not present

## 2024-10-24 DIAGNOSIS — I700 Atherosclerosis of aorta: Secondary | ICD-10-CM | POA: Diagnosis not present

## 2024-10-24 DIAGNOSIS — M5021 Other cervical disc displacement,  high cervical region: Secondary | ICD-10-CM | POA: Diagnosis not present

## 2024-10-24 DIAGNOSIS — R748 Abnormal levels of other serum enzymes: Secondary | ICD-10-CM | POA: Diagnosis not present

## 2024-10-24 LAB — I-STAT CHEM 8, ED
BUN: 32 mg/dL — ABNORMAL HIGH (ref 8–23)
Calcium, Ion: 1.04 mmol/L — ABNORMAL LOW (ref 1.15–1.40)
Chloride: 104 mmol/L (ref 98–111)
Creatinine, Ser: 1.3 mg/dL — ABNORMAL HIGH (ref 0.61–1.24)
Glucose, Bld: 120 mg/dL — ABNORMAL HIGH (ref 70–99)
HCT: 49 % (ref 39.0–52.0)
Hemoglobin: 16.7 g/dL (ref 13.0–17.0)
Potassium: 4.1 mmol/L (ref 3.5–5.1)
Sodium: 133 mmol/L — ABNORMAL LOW (ref 135–145)
TCO2: 20 mmol/L — ABNORMAL LOW (ref 22–32)

## 2024-10-24 LAB — CBC WITH DIFFERENTIAL/PLATELET
Abs Immature Granulocytes: 0.07 K/uL (ref 0.00–0.07)
Basophils Absolute: 0.1 K/uL (ref 0.0–0.1)
Basophils Relative: 0 %
Eosinophils Absolute: 0 K/uL (ref 0.0–0.5)
Eosinophils Relative: 0 %
HCT: 47.7 % (ref 39.0–52.0)
Hemoglobin: 16.6 g/dL (ref 13.0–17.0)
Immature Granulocytes: 1 %
Lymphocytes Relative: 10 %
Lymphs Abs: 1.4 K/uL (ref 0.7–4.0)
MCH: 34.2 pg — ABNORMAL HIGH (ref 26.0–34.0)
MCHC: 34.8 g/dL (ref 30.0–36.0)
MCV: 98.4 fL (ref 80.0–100.0)
Monocytes Absolute: 1.4 K/uL — ABNORMAL HIGH (ref 0.1–1.0)
Monocytes Relative: 10 %
Neutro Abs: 10.9 K/uL — ABNORMAL HIGH (ref 1.7–7.7)
Neutrophils Relative %: 79 %
Platelets: 278 K/uL (ref 150–400)
RBC: 4.85 MIL/uL (ref 4.22–5.81)
RDW: 12.6 % (ref 11.5–15.5)
WBC: 13.9 K/uL — ABNORMAL HIGH (ref 4.0–10.5)
nRBC: 0 % (ref 0.0–0.2)

## 2024-10-24 LAB — URINALYSIS, W/ REFLEX TO CULTURE (INFECTION SUSPECTED)
Bacteria, UA: NONE SEEN
Bilirubin Urine: NEGATIVE
Glucose, UA: NEGATIVE mg/dL
Ketones, ur: 5 mg/dL — AB
Leukocytes,Ua: NEGATIVE
Nitrite: NEGATIVE
Protein, ur: 30 mg/dL — AB
Specific Gravity, Urine: 1.026 (ref 1.005–1.030)
pH: 5 (ref 5.0–8.0)

## 2024-10-24 LAB — RESP PANEL BY RT-PCR (RSV, FLU A&B, COVID)  RVPGX2
Influenza A by PCR: NEGATIVE
Influenza B by PCR: NEGATIVE
Resp Syncytial Virus by PCR: NEGATIVE
SARS Coronavirus 2 by RT PCR: NEGATIVE

## 2024-10-24 LAB — COMPREHENSIVE METABOLIC PANEL WITH GFR
ALT: 26 U/L (ref 0–44)
AST: 28 U/L (ref 15–41)
Albumin: 2.7 g/dL — ABNORMAL LOW (ref 3.5–5.0)
Alkaline Phosphatase: 69 U/L (ref 38–126)
Anion gap: 12 (ref 5–15)
BUN: 28 mg/dL — ABNORMAL HIGH (ref 8–23)
CO2: 22 mmol/L (ref 22–32)
Calcium: 9 mg/dL (ref 8.9–10.3)
Chloride: 100 mmol/L (ref 98–111)
Creatinine, Ser: 1.31 mg/dL — ABNORMAL HIGH (ref 0.61–1.24)
GFR, Estimated: 54 mL/min — ABNORMAL LOW (ref >60)
Glucose, Bld: 123 mg/dL — ABNORMAL HIGH (ref 70–99)
Potassium: 4.3 mmol/L (ref 3.5–5.1)
Sodium: 134 mmol/L — ABNORMAL LOW (ref 135–145)
Total Bilirubin: 1.7 mg/dL — ABNORMAL HIGH (ref 0.0–1.2)
Total Protein: 6.5 g/dL (ref 6.5–8.1)

## 2024-10-24 LAB — PROTIME-INR
INR: 1.2 (ref 0.8–1.2)
Prothrombin Time: 15.4 s — ABNORMAL HIGH (ref 11.4–15.2)

## 2024-10-24 LAB — I-STAT CG4 LACTIC ACID, ED
Lactic Acid, Venous: 2 mmol/L (ref 0.5–1.9)
Lactic Acid, Venous: 1.1 mmol/L (ref 0.5–1.9)

## 2024-10-24 MED ORDER — LIDOCAINE HCL (PF) 1 % IJ SOLN
2.0000 mL | Freq: Once | INTRAMUSCULAR | Status: AC
  Administered 2024-10-24: 2 mL
  Filled 2024-10-24: qty 5

## 2024-10-24 MED ORDER — ACETAMINOPHEN 650 MG RE SUPP
650.0000 mg | Freq: Once | RECTAL | Status: AC
  Administered 2024-10-24: 650 mg via RECTAL
  Filled 2024-10-24: qty 1

## 2024-10-24 MED ORDER — SODIUM CHLORIDE 0.9 % IV SOLN: INTRAVENOUS | Status: DC

## 2024-10-24 MED ORDER — SODIUM CHLORIDE 0.9 % IV SOLN
2.0000 g | Freq: Once | INTRAVENOUS | Status: AC
  Administered 2024-10-24: 2 g via INTRAVENOUS
  Filled 2024-10-24: qty 20

## 2024-10-24 MED ORDER — SODIUM CHLORIDE 0.9 % IV SOLN
500.0000 mg | Freq: Once | INTRAVENOUS | Status: AC
  Administered 2024-10-24: 500 mg via INTRAVENOUS
  Filled 2024-10-24: qty 5

## 2024-10-24 MED ORDER — SODIUM CHLORIDE 0.9 % IV BOLUS (SEPSIS)
1000.0000 mL | Freq: Once | INTRAVENOUS | Status: AC
  Administered 2024-10-24: 1000 mL via INTRAVENOUS

## 2024-10-24 MED ORDER — IOHEXOL 350 MG/ML SOLN
75.0000 mL | Freq: Once | INTRAVENOUS | Status: AC | PRN
  Administered 2024-10-24: 75 mL via INTRAVENOUS

## 2024-10-24 NOTE — Sepsis Progress Note (Signed)
Notified bedside nurse of need to draw blood cultures.  

## 2024-10-24 NOTE — H&P (Incomplete)
 History and Physical    [name]:[id number] DOB: January 28, 1941 DOA: 10/24/2024  Patient coming from: Home.  Chief Complaint: Fever.  HPI: Blake Atkins is a 83 y.o. male with history of dementia complicated by expressive aphasia, hypertension, prior CVA, hyperlipidemia, chronic disease stage III, gout was brought to the ER after patient's wife found that patient was having fever today.  Three days ago patient was brought to the ER after patient had a fall scans were largely unremarkable.  Per patient's wife patient had gone with her for shopping 2 days ago.  Today patient has been running high fevers.  Has been having persistent cough also.  Patient's right knee has been noticed to be having increasing swelling just like the gouty arthritis presentation 2 months ago.  ED Course: In the ER patient had a temperature of 101.2 F tachycardic with CT chest abdomen pelvis done shows features concerning for pneumonia.  Patient's right knee arthrocentesis was done results of which are pending.  Patient started on empiric antibiotics for possible pneumonia with concerning features for developing sepsis.  On exam patient is responding but confused.  Patient also has difficulty moving her right upper extremity likely from trauma from the recent fall.  X-rays do not show any fracture.  MRI brain and C-spine are pending.  Labs show WBC of 13.9 creatinine 1.3 sodium 134.  Lactic acid was 2 which improved to 1.1 after fluid bolus.  Review of Systems: As per HPI, rest all negative.   Past Medical History:  Diagnosis Date   Arthritis    Bowen's disease of penis    x2   Carotid artery occlusion    Chronic kidney disease    Dementia (HCC)    Hyperlipidemia    Hypertension     Past Surgical History:  Procedure Laterality Date   APPENDECTOMY     CAROTID ENDARTERECTOMY Right 2009   CEA   COLONOSCOPY     EYE SURGERY Left Jan. 2014   Cataract and Lens implant   EYE SURGERY Right Feb. 2014    Cataract and Lens implant   INGUINAL HERNIA REPAIR Left 05/24/2024   Procedure: REPAIR, HERNIA, INGUINAL, ADULT;  Surgeon: Belinda Cough, MD;  Location: [facility] OR;  Service: General;  Laterality: Left;  LMA TAP BLOCK   KNEE SURGERY     right   PENILE BIOPSY N/A 06/08/2022   Procedure: EXCISION OF PENILE LESION;  Surgeon: Elisabeth Valli BIRCH, MD;  Location: [facility];  Service: Urology;  Laterality: N/A;  1 HR   UMBILICAL HERNIA REPAIR N/A 05/24/2024   Procedure: REPAIR, HERNIA, UMBILICAL, ADULT;  Surgeon: Belinda Cough, MD;  Location: [facility] OR;  Service: General;  Laterality: N/A;  LMA TAP BLOCK     reports that he has never smoked. He has never used smokeless tobacco. He reports that he does not drink alcohol  and does not use drugs.  No Known Allergies  Family History  Problem Relation Age of Onset   Diabetes Maternal Grandmother    Cancer Mother        Lung   Cancer Father        Abdominal    Colon cancer Neg Hx     Prior to Admission medications   Medication Sig Start Date End Date Taking? Authorizing Provider  acetaminophen  (TYLENOL ) 500 MG tablet Take 1,000 mg by mouth every 8 (eight) hours as needed for moderate pain.   Yes [provider]  allopurinol (ZYLOPRIM) 100 MG tablet Take 100 mg by mouth  daily. 10/08/24  Yes [provider]  aspirin  EC 81 MG tablet Take 81 mg by mouth daily.   Yes [provider]  colchicine  0.6 MG tablet Take 1 tablet (0.6 mg total) by mouth daily for 10 days. Patient taking differently: Take 0.6-1.2 mg by mouth daily as needed (Gout). Take two tablets 1.2 at the first sign of gout flare, take one tablet 0.6 mg one hour later. 08/16/24 02/21/25 Yes Elgergawy, Brayton RAMAN, MD  donepezil  (ARICEPT ) 10 MG tablet Take 10 mg by mouth at bedtime.   Yes [provider]  Multiple Vitamins-Minerals (PRESERVISION AREDS 2) CAPS Take 1 capsule by mouth in the morning and at bedtime. 05/27/18  Yes [provider]  olmesartan (BENICAR) 20  MG tablet Take 20 mg by mouth daily. 04/30/22  Yes [provider]  Polyethyl Glyc-Propyl Glyc PF (SYSTANE ULTRA PF) 0.4-0.3 % SOLN Place 1 drop into both eyes 2 (two) times daily as needed (dry eyes). 10/26/11  Yes [provider]  rosuvastatin  (CRESTOR ) 20 MG tablet Take 20 mg by mouth daily. Patient not taking: Reported on 10/20/2024 10/26/11   [provider]    Physical Exam: Constitutional: Moderately built and nourished. Vitals:   10/24/24 1642 10/24/24 1914 10/24/24 2000 10/24/24 2300  BP: (!) 167/100 (!) 161/76 (!) 171/91 (!) 155/101  Pulse: 86 (!) 52 86 72  Resp: (!) 21 20 19  (!) 23  Temp:  99.1 F (37.3 C)    TempSrc:      SpO2: 100% 100% 100% 99%  Weight:      Height:       Eyes: Anicteric no pallor. ENMT: No discharge from the ears eyes nose or mouth. Neck: No mass felt.  No neck rigidity. Respiratory: No rhonchi or crepitations. Cardiovascular: S1-S2 heard. Abdomen: Soft nontender bowel sound present. Musculoskeletal: Right knee swollen and warm to touch.  Right upper extremity is difficult to move because of pain in the right shoulder. Skin: Chronic skin changes.  Mild ecchymosis in the right shoulder. Neurologic: Patient is lethargic and baseline is aphasic.  Responds to his name called by wife.  Able to move extremities right upper extremity admitted by pain. Psychiatric: Lethargic.   Labs on Admission: I have personally reviewed following labs and imaging studies  CBC: Recent Labs  Lab 10/20/24 0742 10/24/24 1652 10/24/24 1700  WBC 6.7 13.9*  --   NEUTROABS 4.4 10.9*  --   HGB 15.2 16.6 16.7  HCT 44.9 47.7 49.0  MCV 99.6 98.4  --   PLT 242 278  --    Basic Metabolic Panel: Recent Labs  Lab 10/20/24 0742 10/24/24 1652 10/24/24 1700  NA 136 134* 133*  K 3.4* 4.3 4.1  CL 100 100 104  CO2 24 22  --   GLUCOSE 104* 123* 120*  BUN 16 28* 32*  CREATININE 1.35* 1.31* 1.30*  CALCIUM  9.0 9.0  --    GFR: Estimated  Creatinine Clearance: 47.7 mL/min (A) (by C-G formula based on SCr of 1.3 mg/dL (H)). Liver Function Tests: Recent Labs  Lab 10/24/24 1652  AST 28  ALT 26  ALKPHOS 69  BILITOT 1.7*  PROT 6.5  ALBUMIN 2.7*   No results for input(s): LIPASE, AMYLASE in the last 168 hours. No results for input(s): AMMONIA in the last 168 hours. Coagulation Profile: Recent Labs  Lab 10/24/24 1652  INR 1.2   Cardiac Enzymes: No results for input(s): CKTOTAL, CKMB, CKMBINDEX, TROPONINI in the last 168 hours. BNP (last  3 results) No results for input(s): PROBNP in the last 8760 hours. HbA1C: No results for input(s): HGBA1C in the last 72 hours. CBG: No results for input(s): GLUCAP in the last 168 hours. Lipid Profile: No results for input(s): CHOL, HDL, LDLCALC, TRIG, CHOLHDL, LDLDIRECT in the last 72 hours. Thyroid Function Tests: No results for input(s): TSH, T4TOTAL, FREET4, T3FREE, THYROIDAB in the last 72 hours. Anemia Panel: No results for input(s): VITAMINB12, FOLATE, FERRITIN, TIBC, IRON, RETICCTPCT in the last 72 hours. Urine analysis:    Component Value Date/Time   COLORURINE YELLOW 10/24/2024 1808   APPEARANCEUR CLEAR 10/24/2024 1808   LABSPEC 1.026 10/24/2024 1808   PHURINE 5.0 10/24/2024 1808   GLUCOSEU NEGATIVE 10/24/2024 1808   HGBUR SMALL (A) 10/24/2024 1808   BILIRUBINUR NEGATIVE 10/24/2024 1808   KETONESUR 5 (A) 10/24/2024 1808   PROTEINUR 30 (A) 10/24/2024 1808   UROBILINOGEN 1.0 10/25/2008 0955   NITRITE NEGATIVE 10/24/2024 1808   LEUKOCYTESUR NEGATIVE 10/24/2024 1808   Sepsis Labs: @LABRCNTIP (procalcitonin:4,lacticidven:4) ) Recent Results (from the past 240 hours)  Resp panel by RT-PCR (RSV, Flu A&B, Covid) Anterior Nasal Swab     Status: None   Collection Time: 10/24/24  4:45 PM   Specimen: Anterior Nasal Swab  Result Value Ref Range Status   SARS Coronavirus 2 by RT PCR NEGATIVE NEGATIVE Final   Influenza  A by PCR NEGATIVE NEGATIVE Final   Influenza B by PCR NEGATIVE NEGATIVE Final    Comment: (NOTE) The Xpert Xpress SARS-CoV-2/FLU/RSV plus assay is intended as an aid in the diagnosis of influenza from Nasopharyngeal swab specimens and should not be used as a sole basis for treatment. Nasal washings and aspirates are unacceptable for Xpert Xpress SARS-CoV-2/FLU/RSV testing.  Fact Sheet for Patients: bloggercourse.com  Fact Sheet for Healthcare Providers: seriousbroker.it  This test is not yet approved or cleared by the [place]  FDA and has been authorized for detection and/or diagnosis of SARS-CoV-2 by FDA under an Emergency Use Authorization (EUA). This EUA will remain in effect (meaning this test can be used) for the duration of the COVID-19 declaration under Section 564(b)(1) of the Act, 21 U.S.C. section 360bbb-3(b)(1), unless the authorization is terminated or revoked.     Resp Syncytial Virus by PCR NEGATIVE NEGATIVE Final    Comment: (NOTE) Fact Sheet for Patients: bloggercourse.com  Fact Sheet for Healthcare Providers: seriousbroker.it  This test is not yet approved or cleared by the [place]  FDA and has been authorized for detection and/or diagnosis of SARS-CoV-2 by FDA under an Emergency Use Authorization (EUA). This EUA will remain in effect (meaning this test can be used) for the duration of the COVID-19 declaration under Section 564(b)(1) of the Act, 21 U.S.C. section 360bbb-3(b)(1), unless the authorization is terminated or revoked.  Performed at [facility], [address] [place], [place] [address]      Radiological Exams on Admission: CT Angio Chest PE W and/or Wo Contrast Result Date: 10/24/2024 EXAM: CTA CHEST PE WITHOUT AND WITH CONTRAST CT ABDOMEN AND PELVIS WITHOUT AND WITH CONTRAST 10/24/2024 08:56:53 PM TECHNIQUE: CTA of the chest  was performed after the administration of 75 mL of iohexol (OMNIPAQUE) 350 MG/ML injection. Multiplanar reformatted images are provided for review. MIP images are provided for review. CT of the abdomen and pelvis was performed with the administration of intravenous contrast. Automated exposure control, iterative reconstruction, and/or weight based adjustment of the mA/kV was utilized to reduce the radiation dose to as low as reasonably achievable. COMPARISON:  Comparison is made to 07/05/2023. CLINICAL HISTORY: Pulmonary embolism (PE) suspected, high prob; fever, pneumonia. FINDINGS: CHEST: PULMONARY ARTERIES: Pulmonary arteries are adequately opacified for evaluation. No intraluminal filling defect to suggest pulmonary embolism. Central pulmonary arteries are of normal caliber. Main pulmonary artery is normal in caliber. MEDIASTINUM: No mediastinal lymphadenopathy. The heart demonstrates mild coronary artery calcification and global cardiac size is mildly enlarged. No pericardial effusion. Fusiform aneurysm of the ascending aorta measuring 4.3 cm in maximal diameter. Descending thoracic aorta is of normal caliber. Mild atherosclerotic calcification within the thoracic aorta. LUNGS AND PLEURA: Bibasilar atelectasis. Superimposed infiltrate within the posterior basal right lower lobe, possibly infectious in the acute setting. No pneumothorax or pleural effusion. SOFT TISSUES AND BONES: No acute bone or soft tissue abnormality. ABDOMEN AND PELVIS: LIVER: The liver is unremarkable. GALLBLADDER AND BILE DUCTS: Gallbladder is unremarkable. No biliary ductal dilatation. SPLEEN: Spleen demonstrates no acute abnormality. PANCREAS: Pancreas demonstrates no acute abnormality. ADRENAL GLANDS: Adrenal glands demonstrate no acute abnormality. KIDNEYS, URETERS AND BLADDER: Stable simple cortical cysts within the right kidney. The kidneys are otherwise unremarkable. No stones in the kidneys or ureters. No hydronephrosis. No  perinephric or periureteral stranding. Urinary bladder is unremarkable. GI AND BOWEL: Appendix absent. The stomach, small bowel, and large bowel are otherwise unremarkable. There is no bowel obstruction. No abnormal bowel wall thickening or distension. REPRODUCTIVE: Reproductive organs are unremarkable. PERITONEUM AND RETROPERITONEUM: No ascites or free air. LYMPH NODES: No lymphadenopathy. BONES AND SOFT TISSUES: Osseous structures are age appropriate. No acute bone abnormality. No lytic or blastic bone lesion. Mild aortoiliac atherosclerotic calcification. No aortic aneurysm. No focal soft tissue abnormality. RAF SCORE: Aortic atherosclerosis (icd10-i70.0), aortic aneurysm (icd10-i71.9) IMPRESSION: 1. No pulmonary embolism. 2. Superimposed infiltrate within the posterior basal right lower lobe, possibly infectious in the acute setting. 3. Fusiform aneurysm of the ascending thoracic aorta measuring 4.3 cm in maximal diameter. Recommend annual imaging follow-up by CTA or mra. This recommendation follows 2010 accf/aha/aats/ACR/asa/sca/scai/sir/sts/svm guidelines for the diagnosis and management of patients with thoracic aortic disease. Circulation. 2010; 121: Z733-z630. Aortic aneurysm nos (icd10-i71.9) 4. raf score: Aortic atherosclerosis (icd10-i70.0); aortic aneurysm (pri89-p28.9) Electronically signed by: Dorethia Molt MD 10/24/2024 09:31 PM EDT RP Workstation: [id number]   CT ABDOMEN PELVIS W CONTRAST Result Date: 10/24/2024 EXAM: CTA CHEST PE WITHOUT AND WITH CONTRAST CT ABDOMEN AND PELVIS WITHOUT AND WITH CONTRAST 10/24/2024 08:56:53 PM TECHNIQUE: CTA of the chest was performed after the administration of 75 mL of iohexol (OMNIPAQUE) 350 MG/ML injection. Multiplanar reformatted images are provided for review. MIP images are provided for review. CT of the abdomen and pelvis was performed with the administration of intravenous contrast. Automated exposure control, iterative reconstruction, and/or weight  based adjustment of the mA/kV was utilized to reduce the radiation dose to as low as reasonably achievable. COMPARISON: Comparison is made to 07/05/2023. CLINICAL HISTORY: Pulmonary embolism (PE) suspected, high prob; fever, pneumonia. FINDINGS: CHEST: PULMONARY ARTERIES: Pulmonary arteries are adequately opacified for evaluation. No intraluminal filling defect to suggest pulmonary embolism. Central pulmonary arteries are of normal caliber. Main pulmonary artery is normal in caliber. MEDIASTINUM: No mediastinal lymphadenopathy. The heart demonstrates mild coronary artery calcification and global cardiac size is mildly enlarged. No pericardial effusion. Fusiform aneurysm of the ascending aorta measuring 4.3 cm in maximal diameter. Descending thoracic aorta is of normal caliber. Mild atherosclerotic calcification within the thoracic aorta. LUNGS AND PLEURA: Bibasilar atelectasis. Superimposed infiltrate within the posterior basal right lower lobe, possibly infectious in the acute setting. No pneumothorax or pleural  effusion. SOFT TISSUES AND BONES: No acute bone or soft tissue abnormality. ABDOMEN AND PELVIS: LIVER: The liver is unremarkable. GALLBLADDER AND BILE DUCTS: Gallbladder is unremarkable. No biliary ductal dilatation. SPLEEN: Spleen demonstrates no acute abnormality. PANCREAS: Pancreas demonstrates no acute abnormality. ADRENAL GLANDS: Adrenal glands demonstrate no acute abnormality. KIDNEYS, URETERS AND BLADDER: Stable simple cortical cysts within the right kidney. The kidneys are otherwise unremarkable. No stones in the kidneys or ureters. No hydronephrosis. No perinephric or periureteral stranding. Urinary bladder is unremarkable. GI AND BOWEL: Appendix absent. The stomach, small bowel, and large bowel are otherwise unremarkable. There is no bowel obstruction. No abnormal bowel wall thickening or distension. REPRODUCTIVE: Reproductive organs are unremarkable. PERITONEUM AND RETROPERITONEUM: No ascites or  free air. LYMPH NODES: No lymphadenopathy. BONES AND SOFT TISSUES: Osseous structures are age appropriate. No acute bone abnormality. No lytic or blastic bone lesion. Mild aortoiliac atherosclerotic calcification. No aortic aneurysm. No focal soft tissue abnormality. RAF SCORE: Aortic atherosclerosis (icd10-i70.0), aortic aneurysm (icd10-i71.9) IMPRESSION: 1. No pulmonary embolism. 2. Superimposed infiltrate within the posterior basal right lower lobe, possibly infectious in the acute setting. 3. Fusiform aneurysm of the ascending thoracic aorta measuring 4.3 cm in maximal diameter. Recommend annual imaging follow-up by CTA or mra. This recommendation follows 2010 accf/aha/aats/ACR/asa/sca/scai/sir/sts/svm guidelines for the diagnosis and management of patients with thoracic aortic disease. Circulation. 2010; 121: [id number]. Aortic aneurysm nos (icd10-i71.9) 4. raf score: Aortic atherosclerosis (icd10-i70.0); aortic aneurysm (pri89-p28.9) Electronically signed by: Dorethia Molt MD 10/24/2024 09:31 PM EDT RP Workstation: [id number]   CT Head Wo Contrast Result Date: 10/24/2024 CLINICAL DATA:  Mental status change EXAM: CT HEAD WITHOUT CONTRAST TECHNIQUE: Contiguous axial images were obtained from the base of the skull through the vertex without intravenous contrast. RADIATION DOSE REDUCTION: This exam was performed according to the departmental dose-optimization program which includes automated exposure control, adjustment of the mA and/or kV according to patient size and/or use of iterative reconstruction technique. COMPARISON:  CT brain 10/20/2024 FINDINGS: Brain: No acute territorial infarction, hemorrhage or intracranial mass. Atrophy and chronic small vessel ischemic changes of the white matter. Stable ventricle size. Vascular: No hyperdense vessels.  No unexpected calcification Skull: Normal. Negative for fracture or focal lesion. Sinuses/Orbits: Mild mucosal thickening in the sinuses Other: Small none  IMPRESSION: 1. No CT evidence for acute intracranial abnormality. 2. Atrophy and chronic small vessel ischemic changes of the white matter. Electronically Signed   By: Luke Bun M.D.   On: 10/24/2024 17:33   DG Shoulder Right Portable Result Date: 10/24/2024 CLINICAL DATA:  Altered mental status with new onset incontinence. EXAM: RIGHT SHOULDER - 1 VIEW COMPARISON:  None Available. FINDINGS: Mild-to-moderate degenerative change of the [facility] joint. Minimal degenerate change of the glenohumeral joint. No evidence of acute fracture or dislocation. Remainder of the exam is unremarkable. IMPRESSION: 1. No acute findings. 2. Degenerative changes as described. Electronically Signed   By: Toribio Agreste M.D.   On: 10/24/2024 17:23   DG Chest Port 1 View Result Date: 10/24/2024 CLINICAL DATA:  Sepsis.  Altered mental status. EXAM: PORTABLE CHEST 1 VIEW COMPARISON:  10/20/2024 FINDINGS: Lungs are hypoinflated and otherwise clear. Cardiomediastinal silhouette and remainder of the exam is unchanged. IMPRESSION: Hypoinflation without acute cardiopulmonary disease. Electronically Signed   By: Toribio Agreste M.D.   On: 10/24/2024 17:22    EKG: Independently reviewed.  Sinus rhythm.  Assessment/Plan Principal Problem:   Pneumonia    Possible sepsis likely source could be aspiration pneumonia with patient having persistent cough.  CT  scan showing concerning features for pneumonia.  Will keep patient on empiric antibiotics follow cultures.  Continue hydration.  For now we will keep patient n.p.o. will get speech therapy evaluation before starting diet.  Patient also had 1 episode of nausea vomiting. Right knee arthritis likely from gout.  ER physician did have right knee arthrocentesis was done and results are pending.  Per ER physician the fluid looked yellowish and not puslike. Acute metabolic encephalopathy likely from sepsis.  Closely monitor.  MRI brain pending. Right upper extremity weakness secondary to  pain.  X-rays do not show any fracture.  Will get CT of the right shoulder.  MRI of the brain and C-spine pending. Hypertension will keep patient on as needed IV hydralazine until patient can take home ARB orally. Chronic kidney disease stage III with mild hyponatremia creatinine at around baseline.  I think hyponatremia improved with fluids. Alzheimer's dementia complicated by aphasia.  Since patient has concerning features for possible developing sepsis will need close monitoring further workup and more than 2 midnight stay.   DVT prophylaxis: Heparin . Code Status: DNR confirmed with patient's wife. Family Communication: Patient wife and son at the bedside. Disposition Plan: Progressive care. Consults called: Speech therapy. Admission status: Inpatient.

## 2024-10-24 NOTE — Sepsis Progress Note (Signed)
 Elink monitoring for the code sepsis protocol.

## 2024-10-24 NOTE — ED Provider Notes (Addendum)
 [place] EMERGENCY DEPARTMENT AT [facility] Provider Note   CSN: [id number] Arrival date & time: 10/24/24  1629     Patient presents with: Altered Mental Status   Blake Atkins is a 83 y.o. male.   Level 5 caveat due to dementia.  Patient here with fever.  He has been at home with 24/7 aide.  Seems like maybe fever developed yesterday.  He has been more confused today ongoing fever.  No Tylenol  was given.  He is had a cough.  He had a fall recently got some bruising over the right side of the face and right shoulder that they noticed here recently.  He did have images of his head.  But he has been fussy with his right arm.  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  He has been less responsive.  Seem to have a foul-smelling odor from his urine.  No rash.  The history is provided by the patient.       Prior to Admission medications   Medication Sig Start Date End Date Taking? Authorizing Provider  acetaminophen  (TYLENOL ) 500 MG tablet Take 1,000 mg by mouth every 8 (eight) hours as needed for moderate pain.   Yes [provider]  allopurinol (ZYLOPRIM) 100 MG tablet Take 100 mg by mouth daily. 10/08/24  Yes [provider]  aspirin  EC 81 MG tablet Take 81 mg by mouth daily.   Yes [provider]  colchicine  0.6 MG tablet Take 1 tablet (0.6 mg total) by mouth daily for 10 days. Patient taking differently: Take 0.6-1.2 mg by mouth daily as needed (Gout). Take two tablets 1.2 at the first sign of gout flare, take one tablet 0.6 mg one hour later. 08/16/24 02/21/25 Yes Elgergawy, Brayton RAMAN, MD  donepezil  (ARICEPT ) 10 MG tablet Take 10 mg by mouth at bedtime.   Yes [provider]  Multiple Vitamins-Minerals (PRESERVISION AREDS 2) CAPS Take 1 capsule by mouth in the morning and at bedtime. 05/27/18  Yes [provider]  olmesartan (BENICAR) 20 MG tablet Take 20 mg by mouth daily. 04/30/22  Yes [provider]  Polyethyl Glyc-Propyl Glyc  PF (SYSTANE ULTRA PF) 0.4-0.3 % SOLN Place 1 drop into both eyes 2 (two) times daily as needed (dry eyes). 10/26/11  Yes [provider]  rosuvastatin  (CRESTOR ) 20 MG tablet Take 20 mg by mouth daily. Patient not taking: Reported on 10/20/2024 10/26/11   [provider]    Allergies: Patient has no known allergies.    Review of Systems  Updated Vital Signs BP (!) 155/101   Pulse 72   Temp 99.1 F (37.3 C)   Resp (!) 23   Ht 5' 9 (1.753 m)   Wt 86.5 kg   SpO2 99%   BMI 28.16 kg/m   Physical Exam Vitals and nursing note reviewed.  Constitutional:      General: He is not in acute distress.    Appearance: He is well-developed. He is ill-appearing.  HENT:     Head:     Comments: Bruising to the right side of the face    Nose: Nose normal.     Mouth/Throat:     Mouth: Mucous membranes are moist.  Eyes:     Extraocular Movements: Extraocular movements intact.     Conjunctiva/sclera: Conjunctivae normal.     Pupils: Pupils are equal, round, and reactive to light.  Cardiovascular:     Rate and Rhythm: Normal rate and regular rhythm.  Pulses: Normal pulses.     Heart sounds: Normal heart sounds. No murmur heard. Pulmonary:     Effort: Pulmonary effort is normal. No respiratory distress.     Breath sounds: Normal breath sounds.  Abdominal:     General: Abdomen is flat.     Palpations: Abdomen is soft.     Tenderness: There is no abdominal tenderness.  Musculoskeletal:        General: No swelling.     Cervical back: Normal range of motion and neck supple.  Skin:    General: Skin is warm and dry.     Capillary Refill: Capillary refill takes less than 2 seconds.     Findings: Bruising present.     Comments: Bruising to the right shoulder  Neurological:     Mental Status: He is alert.     Comments: Patient is awake opens eyes spontaneously does not talk intermittently follows commands, cannot really get him to move much of his right arm but when I move  it he has discomfort, he can move his lower legs slightly but does not really follow commands with that  Psychiatric:        Mood and Affect: Mood normal.     (all labs ordered are listed, but only abnormal results are displayed) Labs Reviewed  COMPREHENSIVE METABOLIC PANEL WITH GFR - Abnormal; Notable for the following components:      Result Value   Sodium 134 (*)    Glucose, Bld 123 (*)    BUN 28 (*)    Creatinine, Ser 1.31 (*)    Albumin 2.7 (*)    Total Bilirubin 1.7 (*)    GFR, Estimated 54 (*)    All other components within normal limits  CBC WITH DIFFERENTIAL/PLATELET - Abnormal; Notable for the following components:   WBC 13.9 (*)    MCH 34.2 (*)    Neutro Abs 10.9 (*)    Monocytes Absolute 1.4 (*)    All other components within normal limits  PROTIME-INR - Abnormal; Notable for the following components:   Prothrombin Time 15.4 (*)    All other components within normal limits  URINALYSIS, W/ REFLEX TO CULTURE (INFECTION SUSPECTED) - Abnormal; Notable for the following components:   Hgb urine dipstick SMALL (*)    Ketones, ur 5 (*)    Protein, ur 30 (*)    All other components within normal limits  I-STAT CG4 LACTIC ACID, ED - Abnormal; Notable for the following components:   Lactic Acid, Venous 2.0 (*)    All other components within normal limits  I-STAT CHEM 8, ED - Abnormal; Notable for the following components:   Sodium 133 (*)    BUN 32 (*)    Creatinine, Ser 1.30 (*)    Glucose, Bld 120 (*)    Calcium , Ion 1.04 (*)    TCO2 20 (*)    All other components within normal limits  RESP PANEL BY RT-PCR (RSV, FLU A&B, COVID)  RVPGX2  CULTURE, BLOOD (ROUTINE X 2)  CULTURE, BLOOD (ROUTINE X 2)  BODY FLUID CULTURE W GRAM STAIN  GLUCOSE, BODY FLUID OTHER            PROTEIN, BODY FLUID (OTHER)  SYNOVIAL CELL COUNT + DIFF, W/ CRYSTALS  URIC ACID, BODY FLUID  I-STAT CG4 LACTIC ACID, ED    EKG: EKG Interpretation Date/Time:  Wednesday October 24 2024 16:48:48  EDT Ventricular Rate:  82 PR Interval:  206 QRS Duration:  84 QT Interval:  356 QTC Calculation: 416 R Axis:   -29  Text Interpretation: Sinus rhythm Supraventricular bigeminy Left ventricular hypertrophy Anterior infarct, old Nonspecific T abnormalities, lateral leads Confirmed by Ruthe Cornet [phone]) on 10/24/2024 4:58:10 PM  Radiology: CT Angio Chest PE W and/or Wo Contrast Result Date: 10/24/2024 EXAM: CTA CHEST PE WITHOUT AND WITH CONTRAST CT ABDOMEN AND PELVIS WITHOUT AND WITH CONTRAST 10/24/2024 08:56:53 PM TECHNIQUE: CTA of the chest was performed after the administration of 75 mL of iohexol (OMNIPAQUE) 350 MG/ML injection. Multiplanar reformatted images are provided for review. MIP images are provided for review. CT of the abdomen and pelvis was performed with the administration of intravenous contrast. Automated exposure control, iterative reconstruction, and/or weight based adjustment of the mA/kV was utilized to reduce the radiation dose to as low as reasonably achievable. COMPARISON: Comparison is made to 07/05/2023. CLINICAL HISTORY: Pulmonary embolism (PE) suspected, high prob; fever, pneumonia. FINDINGS: CHEST: PULMONARY ARTERIES: Pulmonary arteries are adequately opacified for evaluation. No intraluminal filling defect to suggest pulmonary embolism. Central pulmonary arteries are of normal caliber. Main pulmonary artery is normal in caliber. MEDIASTINUM: No mediastinal lymphadenopathy. The heart demonstrates mild coronary artery calcification and global cardiac size is mildly enlarged. No pericardial effusion. Fusiform aneurysm of the ascending aorta measuring 4.3 cm in maximal diameter. Descending thoracic aorta is of normal caliber. Mild atherosclerotic calcification within the thoracic aorta. LUNGS AND PLEURA: Bibasilar atelectasis. Superimposed infiltrate within the posterior basal right lower lobe, possibly infectious in the acute setting. No pneumothorax or pleural effusion.  SOFT TISSUES AND BONES: No acute bone or soft tissue abnormality. ABDOMEN AND PELVIS: LIVER: The liver is unremarkable. GALLBLADDER AND BILE DUCTS: Gallbladder is unremarkable. No biliary ductal dilatation. SPLEEN: Spleen demonstrates no acute abnormality. PANCREAS: Pancreas demonstrates no acute abnormality. ADRENAL GLANDS: Adrenal glands demonstrate no acute abnormality. KIDNEYS, URETERS AND BLADDER: Stable simple cortical cysts within the right kidney. The kidneys are otherwise unremarkable. No stones in the kidneys or ureters. No hydronephrosis. No perinephric or periureteral stranding. Urinary bladder is unremarkable. GI AND BOWEL: Appendix absent. The stomach, small bowel, and large bowel are otherwise unremarkable. There is no bowel obstruction. No abnormal bowel wall thickening or distension. REPRODUCTIVE: Reproductive organs are unremarkable. PERITONEUM AND RETROPERITONEUM: No ascites or free air. LYMPH NODES: No lymphadenopathy. BONES AND SOFT TISSUES: Osseous structures are age appropriate. No acute bone abnormality. No lytic or blastic bone lesion. Mild aortoiliac atherosclerotic calcification. No aortic aneurysm. No focal soft tissue abnormality. RAF SCORE: Aortic atherosclerosis (icd10-i70.0), aortic aneurysm (icd10-i71.9) IMPRESSION: 1. No pulmonary embolism. 2. Superimposed infiltrate within the posterior basal right lower lobe, possibly infectious in the acute setting. 3. Fusiform aneurysm of the ascending thoracic aorta measuring 4.3 cm in maximal diameter. Recommend annual imaging follow-up by CTA or mra. This recommendation follows 2010 accf/aha/aats/ACR/asa/sca/scai/sir/sts/svm guidelines for the diagnosis and management of patients with thoracic aortic disease. Circulation. 2010; 121: Z733-z630. Aortic aneurysm nos (icd10-i71.9) 4. raf score: Aortic atherosclerosis (icd10-i70.0); aortic aneurysm (pri89-p28.9) Electronically signed by: Dorethia Molt MD 10/24/2024 09:31 PM EDT RP Workstation:  [id number]   CT ABDOMEN PELVIS W CONTRAST Result Date: 10/24/2024 EXAM: CTA CHEST PE WITHOUT AND WITH CONTRAST CT ABDOMEN AND PELVIS WITHOUT AND WITH CONTRAST 10/24/2024 08:56:53 PM TECHNIQUE: CTA of the chest was performed after the administration of 75 mL of iohexol (OMNIPAQUE) 350 MG/ML injection. Multiplanar reformatted images are provided for review. MIP images are provided for review. CT of the abdomen and pelvis was performed with the administration of intravenous contrast. Automated exposure control, iterative reconstruction,  and/or weight based adjustment of the mA/kV was utilized to reduce the radiation dose to as low as reasonably achievable. COMPARISON: Comparison is made to 07/05/2023. CLINICAL HISTORY: Pulmonary embolism (PE) suspected, high prob; fever, pneumonia. FINDINGS: CHEST: PULMONARY ARTERIES: Pulmonary arteries are adequately opacified for evaluation. No intraluminal filling defect to suggest pulmonary embolism. Central pulmonary arteries are of normal caliber. Main pulmonary artery is normal in caliber. MEDIASTINUM: No mediastinal lymphadenopathy. The heart demonstrates mild coronary artery calcification and global cardiac size is mildly enlarged. No pericardial effusion. Fusiform aneurysm of the ascending aorta measuring 4.3 cm in maximal diameter. Descending thoracic aorta is of normal caliber. Mild atherosclerotic calcification within the thoracic aorta. LUNGS AND PLEURA: Bibasilar atelectasis. Superimposed infiltrate within the posterior basal right lower lobe, possibly infectious in the acute setting. No pneumothorax or pleural effusion. SOFT TISSUES AND BONES: No acute bone or soft tissue abnormality. ABDOMEN AND PELVIS: LIVER: The liver is unremarkable. GALLBLADDER AND BILE DUCTS: Gallbladder is unremarkable. No biliary ductal dilatation. SPLEEN: Spleen demonstrates no acute abnormality. PANCREAS: Pancreas demonstrates no acute abnormality. ADRENAL GLANDS: Adrenal glands  demonstrate no acute abnormality. KIDNEYS, URETERS AND BLADDER: Stable simple cortical cysts within the right kidney. The kidneys are otherwise unremarkable. No stones in the kidneys or ureters. No hydronephrosis. No perinephric or periureteral stranding. Urinary bladder is unremarkable. GI AND BOWEL: Appendix absent. The stomach, small bowel, and large bowel are otherwise unremarkable. There is no bowel obstruction. No abnormal bowel wall thickening or distension. REPRODUCTIVE: Reproductive organs are unremarkable. PERITONEUM AND RETROPERITONEUM: No ascites or free air. LYMPH NODES: No lymphadenopathy. BONES AND SOFT TISSUES: Osseous structures are age appropriate. No acute bone abnormality. No lytic or blastic bone lesion. Mild aortoiliac atherosclerotic calcification. No aortic aneurysm. No focal soft tissue abnormality. RAF SCORE: Aortic atherosclerosis (icd10-i70.0), aortic aneurysm (icd10-i71.9) IMPRESSION: 1. No pulmonary embolism. 2. Superimposed infiltrate within the posterior basal right lower lobe, possibly infectious in the acute setting. 3. Fusiform aneurysm of the ascending thoracic aorta measuring 4.3 cm in maximal diameter. Recommend annual imaging follow-up by CTA or mra. This recommendation follows 2010 accf/aha/aats/ACR/asa/sca/scai/sir/sts/svm guidelines for the diagnosis and management of patients with thoracic aortic disease. Circulation. 2010; 121: Z733-z630. Aortic aneurysm nos (icd10-i71.9) 4. raf score: Aortic atherosclerosis (icd10-i70.0); aortic aneurysm (pri89-p28.9) Electronically signed by: Dorethia Molt MD 10/24/2024 09:31 PM EDT RP Workstation: [id number]   CT Head Wo Contrast Result Date: 10/24/2024 CLINICAL DATA:  Mental status change EXAM: CT HEAD WITHOUT CONTRAST TECHNIQUE: Contiguous axial images were obtained from the base of the skull through the vertex without intravenous contrast. RADIATION DOSE REDUCTION: This exam was performed according to the departmental  dose-optimization program which includes automated exposure control, adjustment of the mA and/or kV according to patient size and/or use of iterative reconstruction technique. COMPARISON:  CT brain 10/20/2024 FINDINGS: Brain: No acute territorial infarction, hemorrhage or intracranial mass. Atrophy and chronic small vessel ischemic changes of the white matter. Stable ventricle size. Vascular: No hyperdense vessels.  No unexpected calcification Skull: Normal. Negative for fracture or focal lesion. Sinuses/Orbits: Mild mucosal thickening in the sinuses Other: Small none IMPRESSION: 1. No CT evidence for acute intracranial abnormality. 2. Atrophy and chronic small vessel ischemic changes of the white matter. Electronically Signed   By: Luke Bun M.D.   On: 10/24/2024 17:33   DG Shoulder Right Portable Result Date: 10/24/2024 CLINICAL DATA:  Altered mental status with new onset incontinence. EXAM: RIGHT SHOULDER - 1 VIEW COMPARISON:  None Available. FINDINGS: Mild-to-moderate degenerative change of the [facility] joint.  Minimal degenerate change of the glenohumeral joint. No evidence of acute fracture or dislocation. Remainder of the exam is unremarkable. IMPRESSION: 1. No acute findings. 2. Degenerative changes as described. Electronically Signed   By: Toribio Agreste M.D.   On: 10/24/2024 17:23   DG Chest Port 1 View Result Date: 10/24/2024 CLINICAL DATA:  Sepsis.  Altered mental status. EXAM: PORTABLE CHEST 1 VIEW COMPARISON:  10/20/2024 FINDINGS: Lungs are hypoinflated and otherwise clear. Cardiomediastinal silhouette and remainder of the exam is unchanged. IMPRESSION: Hypoinflation without acute cardiopulmonary disease. Electronically Signed   By: Toribio Agreste M.D.   On: 10/24/2024 17:22     .Joint Aspiration/Arthrocentesis  Date/Time: 10/24/2024 11:08 PM  Performed by: Ruthe Cornet, DO Authorized by: Ruthe Cornet, DO   Consent:    Consent obtained:  Verbal   Consent given by:  Healthcare agent    Risks, benefits, and alternatives were discussed: yes     Risks discussed:  Bleeding, incomplete drainage, pain, poor cosmetic result, nerve damage and infection   Alternatives discussed:  No treatment Universal protocol:    Procedure explained and questions answered to patient or proxy's satisfaction: yes     Relevant documents present and verified: yes     Test results available: yes     Patient identity confirmed:  Arm band Location:    Location:  Knee   Knee:  R knee Anesthesia:    Anesthesia method:  Local infiltration   Local anesthetic:  Lidocaine  1% w/o epi Procedure details:    Preparation: Patient was prepped and draped in usual sterile fashion     Needle gauge:  18 G   Ultrasound guidance: no     Approach:  Lateral   Aspirate amount:  45 cc   Aspirate characteristics:  Yellow   Steroid injected: no     Specimen collected: yes   Post-procedure details:    Dressing:  Gauze roll   Procedure completion:  Tolerated    Medications Ordered in the ED  0.9 %  sodium chloride  infusion ( Intravenous New Bag/Given 10/24/24 1948)  sodium chloride  0.9 % bolus 1,000 mL (0 mLs Intravenous Stopped 10/24/24 1744)  cefTRIAXone (ROCEPHIN) 2 g in sodium chloride  0.9 % 100 mL IVPB (0 g Intravenous Stopped 10/24/24 1743)  azithromycin (ZITHROMAX) 500 mg in sodium chloride  0.9 % 250 mL IVPB (0 mg Intravenous Stopped 10/24/24 1912)  acetaminophen  (TYLENOL ) suppository 650 mg (650 mg Rectal Given 10/24/24 1730)  iohexol (OMNIPAQUE) 350 MG/ML injection 75 mL (75 mLs Intravenous Contrast Given 10/24/24 2057)  lidocaine  (PF) (XYLOCAINE ) 1 % injection 2 mL (2 mLs Other Given 10/24/24 2256)                                    Medical Decision Making Amount and/or Complexity of Data Reviewed Labs: ordered. Radiology: ordered.  Risk OTC drugs. Prescription drug management. Decision regarding hospitalization.   Renell Allum is here with fever.  Patient heart rate in the 90s temperature  of 101.1.  He has a cough on exam.  He had a recent fall I had ED stay for that and then was eventually placed at home with 24/7 care.  Fever sounds like maybe started yesterday.  He has a history of dementia.  He has been less responsive today.  He has got bruising to his right shoulder right side of the face.  Bruising did not really show up until a few  days after the fall.  He has not been moving the right arm is much.  I cannot really get him to respond to neuroexam.  Maybe a little bit of decreased grip in the right hand do not really know when that fully started.  But could be secondary to what I think is a contusion to his right shoulder.  He moves his left arm.  He moves both legs slightly but not much.  Given fever we will pursue sepsis workup.  He has had foul-smelling urine.  He said a cough.  This could be pneumonia and UTI viral process.  Will start broad-spectrum IV antibiotics IV fluids Tylenol  labs including blood culture lactic acid.  Will get a new CT scan of the head.  Overall lab work shows mild lactic acidosis.  White count is 13.9.  Creatinine is 1.3.  COVID flu RSV test negative.  CT scan of the chest was obtained given chest x-ray with no obvious pneumonia.  CT of the chest did show pneumonia.  Incidental fusiform aneurysm as well.  But otherwise there is no intra-abdominal infection.  Overall will admit for sepsis care for pneumonia.  He remains hemodynamically stable.  Lactic acidosis has improved.  Will admit to medicine for further care.  Will also pursue MRI to make sure there is no stroke given questionable weakness in the right arm want a make sure there is no aspiration risk for that.  We also performed arthrocentesis to remove some fluid from the knee joint as well to evaluate for possible septic joint although clinically low suspicion for this.  He just recently had arthrocentesis that showed gout.  Family consented for arthrocentesis which was performed.  Pullback yellowish  fluid.  Will send this off for evaluation.  This chart was dictated using voice recognition software.  Despite best efforts to proofread,  errors can occur which can change the documentation meaning.      Final diagnoses:  Sepsis, due to unspecified organism, unspecified whether acute organ dysfunction present [facility])  Community acquired pneumonia, unspecified laterality    ED Discharge Orders     None          Ruthe Cornet, DO 10/24/24 2138    Ruthe Cornet, DO 10/24/24 2201    Ruthe Cornet, DO 10/24/24 2309

## 2024-10-24 NOTE — ED Triage Notes (Signed)
 Patient arrives via [place] ems from home for altered mental status with fever. Wife requesting reeval after hospital dc. Hx of dementia but independent normally. New onset of incontinence. Wife said urine was not tested at recent hospital stay. Febrile 103 temporal with ems. Last BP 110/70, HR 110, CBG 130. Bruising to right eye from fall last hospital visit. Attempted IV tylenol  without success. IV with LR 1000 en route. 18 LAC. Normally able to follow commands.

## 2024-10-25 ENCOUNTER — Inpatient Hospital Stay (HOSPITAL_COMMUNITY)

## 2024-10-25 ENCOUNTER — Other Ambulatory Visit: Payer: Self-pay

## 2024-10-25 ENCOUNTER — Encounter (HOSPITAL_COMMUNITY): Payer: Self-pay | Admitting: Internal Medicine

## 2024-10-25 DIAGNOSIS — J690 Pneumonitis due to inhalation of food and vomit: Secondary | ICD-10-CM

## 2024-10-25 DIAGNOSIS — G9341 Metabolic encephalopathy: Secondary | ICD-10-CM | POA: Diagnosis not present

## 2024-10-25 DIAGNOSIS — M4802 Spinal stenosis, cervical region: Secondary | ICD-10-CM | POA: Diagnosis not present

## 2024-10-25 DIAGNOSIS — R4701 Aphasia: Secondary | ICD-10-CM | POA: Diagnosis not present

## 2024-10-25 DIAGNOSIS — M47812 Spondylosis without myelopathy or radiculopathy, cervical region: Secondary | ICD-10-CM | POA: Diagnosis not present

## 2024-10-25 DIAGNOSIS — G319 Degenerative disease of nervous system, unspecified: Secondary | ICD-10-CM | POA: Diagnosis not present

## 2024-10-25 DIAGNOSIS — R9089 Other abnormal findings on diagnostic imaging of central nervous system: Secondary | ICD-10-CM | POA: Diagnosis not present

## 2024-10-25 DIAGNOSIS — M5021 Other cervical disc displacement,  high cervical region: Secondary | ICD-10-CM | POA: Diagnosis not present

## 2024-10-25 DIAGNOSIS — I6782 Cerebral ischemia: Secondary | ICD-10-CM | POA: Diagnosis not present

## 2024-10-25 DIAGNOSIS — A419 Sepsis, unspecified organism: Secondary | ICD-10-CM

## 2024-10-25 DIAGNOSIS — M109 Gout, unspecified: Secondary | ICD-10-CM | POA: Insufficient documentation

## 2024-10-25 DIAGNOSIS — M50221 Other cervical disc displacement at C4-C5 level: Secondary | ICD-10-CM | POA: Diagnosis not present

## 2024-10-25 DIAGNOSIS — R29818 Other symptoms and signs involving the nervous system: Secondary | ICD-10-CM | POA: Diagnosis not present

## 2024-10-25 LAB — COMPREHENSIVE METABOLIC PANEL WITH GFR
ALT: 36 U/L (ref 0–44)
AST: 39 U/L (ref 15–41)
Albumin: 2.4 g/dL — ABNORMAL LOW (ref 3.5–5.0)
Alkaline Phosphatase: 67 U/L (ref 38–126)
Anion gap: 10 (ref 5–15)
BUN: 22 mg/dL (ref 8–23)
CO2: 22 mmol/L (ref 22–32)
Calcium: 8.4 mg/dL — ABNORMAL LOW (ref 8.9–10.3)
Chloride: 105 mmol/L (ref 98–111)
Creatinine, Ser: 1.31 mg/dL — ABNORMAL HIGH (ref 0.61–1.24)
GFR, Estimated: 54 mL/min — ABNORMAL LOW (ref >60)
Glucose, Bld: 105 mg/dL — ABNORMAL HIGH (ref 70–99)
Potassium: 4 mmol/L (ref 3.5–5.1)
Sodium: 137 mmol/L (ref 135–145)
Total Bilirubin: 1 mg/dL (ref 0.0–1.2)
Total Protein: 6.1 g/dL — ABNORMAL LOW (ref 6.5–8.1)

## 2024-10-25 LAB — CBC
HCT: 44.2 % (ref 39.0–52.0)
Hemoglobin: 15.2 g/dL (ref 13.0–17.0)
MCH: 33.8 pg (ref 26.0–34.0)
MCHC: 34.4 g/dL (ref 30.0–36.0)
MCV: 98.2 fL (ref 80.0–100.0)
Platelets: 260 K/uL (ref 150–400)
RBC: 4.5 MIL/uL (ref 4.22–5.81)
RDW: 12.7 % (ref 11.5–15.5)
WBC: 11.8 K/uL — ABNORMAL HIGH (ref 4.0–10.5)
nRBC: 0 % (ref 0.0–0.2)

## 2024-10-25 LAB — SYNOVIAL CELL COUNT + DIFF, W/ CRYSTALS
Eosinophils-Synovial: 0 % (ref 0–1)
Lymphocytes-Synovial Fld: 5 % (ref 0–20)
Monocyte-Macrophage-Synovial Fluid: 40 % — ABNORMAL LOW (ref 50–90)
Neutrophil, Synovial: 55 % — ABNORMAL HIGH (ref 0–25)
WBC, Synovial: 4250 /mm3 — ABNORMAL HIGH (ref 0–200)

## 2024-10-25 LAB — CBG MONITORING, ED: Glucose-Capillary: 125 mg/dL — ABNORMAL HIGH (ref 70–99)

## 2024-10-25 MED ORDER — ONDANSETRON HCL 4 MG/2ML IJ SOLN
INTRAMUSCULAR | Status: AC
  Filled 2024-10-25: qty 2

## 2024-10-25 MED ORDER — SODIUM CHLORIDE 0.9 % IV SOLN
500.0000 mg | INTRAVENOUS | Status: DC
  Administered 2024-10-25 – 2024-10-28 (×4): 500 mg via INTRAVENOUS
  Filled 2024-10-25 (×4): qty 5

## 2024-10-25 MED ORDER — VANCOMYCIN HCL 1250 MG/250ML IV SOLN
1250.0000 mg | INTRAVENOUS | Status: DC
  Administered 2024-10-26 – 2024-10-28 (×3): 1250 mg via INTRAVENOUS
  Filled 2024-10-25 (×3): qty 250

## 2024-10-25 MED ORDER — HEPARIN SODIUM (PORCINE) 5000 UNIT/ML IJ SOLN
5000.0000 [IU] | Freq: Three times a day (TID) | INTRAMUSCULAR | Status: DC
  Administered 2024-10-25 – 2024-10-31 (×17): 5000 [IU] via SUBCUTANEOUS
  Filled 2024-10-25 (×17): qty 1

## 2024-10-25 MED ORDER — LACTATED RINGERS IV SOLN
INTRAVENOUS | Status: AC
Start: 2024-10-25 — End: 2024-10-26

## 2024-10-25 MED ORDER — VANCOMYCIN HCL 1500 MG/300ML IV SOLN
1500.0000 mg | Freq: Once | INTRAVENOUS | Status: AC
  Administered 2024-10-25: 1500 mg via INTRAVENOUS
  Filled 2024-10-25: qty 300

## 2024-10-25 MED ORDER — HYDRALAZINE HCL 20 MG/ML IJ SOLN: 5.0000 mg | INTRAMUSCULAR | Status: DC | PRN

## 2024-10-25 MED ORDER — ACETAMINOPHEN 650 MG RE SUPP
650.0000 mg | Freq: Four times a day (QID) | RECTAL | Status: DC | PRN
  Administered 2024-10-26: 650 mg via RECTAL
  Filled 2024-10-25: qty 1

## 2024-10-25 MED ORDER — LACTATED RINGERS IV SOLN: INTRAVENOUS | Status: DC

## 2024-10-25 MED ORDER — GADOBUTROL 1 MMOL/ML IV SOLN
9.0000 mL | Freq: Once | INTRAVENOUS | Status: AC | PRN
  Administered 2024-10-25: 9 mL via INTRAVENOUS

## 2024-10-25 MED ORDER — PIPERACILLIN-TAZOBACTAM 3.375 G IVPB
3.3750 g | Freq: Three times a day (TID) | INTRAVENOUS | Status: DC
  Administered 2024-10-25 – 2024-10-27 (×7): 3.375 g via INTRAVENOUS
  Filled 2024-10-25 (×7): qty 50

## 2024-10-25 MED ORDER — ONDANSETRON HCL 4 MG/2ML IJ SOLN
4.0000 mg | Freq: Once | INTRAMUSCULAR | Status: AC
  Administered 2024-10-25: 4 mg via INTRAVENOUS

## 2024-10-25 MED ORDER — SODIUM CHLORIDE 0.9 % IV SOLN: 2.0000 g | INTRAVENOUS | Status: DC

## 2024-10-25 MED ORDER — ACETAMINOPHEN 325 MG PO TABS: 650.0000 mg | ORAL_TABLET | Freq: Four times a day (QID) | ORAL | Status: DC | PRN

## 2024-10-25 NOTE — Evaluation (Signed)
 Clinical/Bedside Swallow Evaluation Patient Details  Name: Blake Atkins MRN: [id number] Date of Birth: [birth year]  Today's Date: 10/25/2024 Time: SLP Start Time (ACUTE ONLY): [phone] SLP Stop Time (ACUTE ONLY): 1017 SLP Time Calculation (min) (ACUTE ONLY): 35 min  Past Medical History:  Past Medical History:  Diagnosis Date   Arthritis    Bowen's disease of penis    x2   Carotid artery occlusion    Chronic kidney disease    Dementia (HCC)    Hyperlipidemia    Hypertension    Past Surgical History:  Past Surgical History:  Procedure Laterality Date   APPENDECTOMY     CAROTID ENDARTERECTOMY Right 2009   CEA   COLONOSCOPY     EYE SURGERY Left Jan. 2014   Cataract and Lens implant   EYE SURGERY Right Feb. 2014   Cataract and Lens implant   INGUINAL HERNIA REPAIR Left 05/24/2024   Procedure: REPAIR, HERNIA, INGUINAL, ADULT;  Surgeon: Belinda Cough, MD;  Location: [facility] OR;  Service: General;  Laterality: Left;  LMA TAP BLOCK   KNEE SURGERY     right   PENILE BIOPSY N/A 06/08/2022   Procedure: EXCISION OF PENILE LESION;  Surgeon: Elisabeth Valli BIRCH, MD;  Location: [facility];  Service: Urology;  Laterality: N/A;  1 HR   UMBILICAL HERNIA REPAIR N/A 05/24/2024   Procedure: REPAIR, HERNIA, UMBILICAL, ADULT;  Surgeon: Belinda Cough, MD;  Location: [facility] OR;  Service: General;  Laterality: N/A;  LMA TAP BLOCK   HPI:  83 yo presenting 10/29 with fever, cough, AMS. CXR without evidence of infection although more suggestive on CT. MRI Brain negative. Admitted with possible sepsis with concern for PNA. PMH significant for dementia complicated by expressive aphasia, HTN, CVA, HLD, CKD III. Pt recently admitted 10/25-10/27 s/p fall.    Assessment / Plan / Recommendation  Clinical Impression  Pt's swallowing is largely impacted by lethargy at this time, with pt having difficulty maintaining alertness in between small offerings of POs. He is also exhibiting intermittent apnea (new onset this morning per  wife, RN aware). His level of alertness and respiratory status will need to improve prior to initiation of PO diet, with consideration for further swallow testing at that time given report of fairly new change in swallowing PTA. For now, would offer limited ice chips after oral care and essential meds crushed in puree only if fully alert and supervised.   Although pt does have a h/o cognitive-linguistic impairment, his wife notes that changes to his swallowing have been more acute in nature in the setting of other medical issues and generalized decline (gout, falls). She has noticed him pocketing pills and solids, and also notes that he has not been able to keep up with his usual oral care regimen (typically brushes his teeth at least 5 times a day). Today, he needs a lot of cueing to become alert enough to take a bolus, and does not maintain this well in between trials. Awareness seems to be more reduced with purees and thin liquids vs initial trials of ice chips. He has occasional delayed coughing, but unable to assess at bedside if this is related to swallowing vs his baseline cough. Further testing may need to be considered given his more recent change in functional as well as concern for PNA, but he would need to be consistently more alert to be ready for that.  SLP Visit Diagnosis: Dysphagia, unspecified (R13.10)    Aspiration Risk       Diet  Recommendation NPO except meds;Ice chips PRN after oral care    Medication Administration: Crushed with puree    Other  Recommendations Oral Care Recommendations: Oral care QID;Staff/trained caregiver to provide oral care Caregiver Recommendations: Have oral suction available     Assistance Recommended at Discharge    Functional Status Assessment Patient has had a recent decline in their functional status and demonstrates the ability to make significant improvements in function in a reasonable and predictable amount of time.  Frequency and Duration min  2x/week  2 weeks       Prognosis Prognosis for improved oropharyngeal function: Good Barriers to Reach Goals: Cognitive deficits;Language deficits      Swallow Study   General HPI: 83 yo presenting 10/29 with fever, cough, AMS. CXR without evidence of infection although more suggestive on CT. MRI Brain negative. Admitted with possible sepsis with concern for PNA. PMH significant for dementia complicated by expressive aphasia, HTN, CVA, HLD, CKD III. Pt recently admitted 10/25-10/27 s/p fall. Type of Study: Bedside Swallow Evaluation Previous Swallow Assessment: none in chart Diet Prior to this Study: NPO Temperature Spikes Noted: Yes (101.2) Respiratory Status: Room air History of Recent Intubation: No Behavior/Cognition: Lethargic/Drowsy;Requires cueing Oral Cavity - Dentition: Adequate natural dentition Self-Feeding Abilities: Total assist Patient Positioning: Upright in bed Baseline Vocal Quality: Normal    Oral/Motor/Sensory Function Overall Oral Motor/Sensory Function: Other (comment) (cognitively not able to assess)   Ice Chips Ice chips: Within functional limits Presentation: Spoon   Thin Liquid Thin Liquid: Impaired Presentation: Straw Oral Phase Impairments: Poor awareness of bolus Pharyngeal  Phase Impairments: Cough - Delayed    Nectar Thick Nectar Thick Liquid: Not tested   Honey Thick Honey Thick Liquid: Not tested   Puree Puree: Impaired Presentation: Spoon Oral Phase Impairments: Poor awareness of bolus   Solid     Solid: Not tested      Leita SAILOR., M.A. CCC-SLP Acute Rehabilitation Services Office: [phone]  Secure chat preferred  10/25/2024,10:51 AM

## 2024-10-25 NOTE — ED Notes (Signed)
 PT returned from CT scan. Medications were administered via IV. Lung sounds were noted to be crackles in the lower bases. PT is aphasic at baseline and has alzheimer's. PT's wife was at the bedside, has currently left and will be back. PT is resting comfortably in the bed.

## 2024-10-25 NOTE — ED Notes (Signed)
 Pt incontinent. Pt changed,and bedding changed. Clean, dry and skin intact.

## 2024-10-25 NOTE — ED Notes (Signed)
 Patient transported to CT

## 2024-10-25 NOTE — ED Notes (Signed)
PT back from MRI. 

## 2024-10-25 NOTE — Progress Notes (Signed)
 Pharmacy Antibiotic Note  Blake Atkins is a 83 y.o. male admitted on 10/24/2024 with pneumonia.  Pharmacy has been consulted for Vancomycin dosing. WBC is elevated. CrCl ~45-50.   Plan: Vancomycin 1250 mg IV q24h >>>Estimated AUC: 492 Zosyn per MD Trend WBC, temp, renal function  F/U infectious work-up Drug levels as indicated   Height: 5' 9 (175.3 cm) Weight: 86.5 kg (190 lb 11.2 oz) IBW/kg (Calculated) : 70.7  Temp (24hrs), Avg:100.2 F (37.9 C), Min:99.1 F (37.3 C), Max:101.2 F (38.4 C)  Recent Labs  Lab 10/20/24 0742 10/24/24 1652 10/24/24 1700 10/24/24 1701 10/24/24 2000  WBC 6.7 13.9*  --   --   --   CREATININE 1.35* 1.31* 1.30*  --   --   LATICACIDVEN  --   --   --  2.0* 1.1    Estimated Creatinine Clearance: 47.7 mL/min (A) (by C-G formula based on SCr of 1.3 mg/dL (H)).    No Known Allergies  Lynwood Mckusick, PharmD, BCPS Clinical Pharmacist Phone: [phone]

## 2024-10-25 NOTE — Progress Notes (Signed)
 Progress Note   Patient: Blake Atkins FMW:[id number] DOB: [birth year] DOA: 10/24/2024     1 DOS: the patient was seen and examined on 10/25/2024   Brief hospital course: 83 y.o. male with history of dementia complicated by expressive aphasia, hypertension, prior CVA, hyperlipidemia, chronic disease stage III, gout was brought to the ER after patient's wife found that patient was having fever  In the ER patient had a temperature of 101.2 F tachycardic with CT chest abdomen pelvis done shows features concerning for pneumonia.  Patient's right knee arthrocentesis with rare wbcs, no organisms seen.  Patient started on empiric antibiotics for possible pneumonia with concerning features for developing sepsis.    Assessment and Plan: Possible sepsis likely source could be aspiration pneumonia with patient having persistent cough.   CT scan showing concerning features for pneumonia.  Will keep patient on empiric antibiotics rocephine and azithromycin follow cultures.  Continue hydration.  For now we will keep patient n.p.o. speech therapy following Patient also had 1 episode of nausea vomiting. Right knee arthritis likely from gout.  ER physician did have right knee arthrocentesis was done, no organism noted, rare WBC   fluid looked yellowish and not puslike. Acute metabolic encephalopathy likely from sepsis.  Closely monitor.  MRI brain with no acute pathology Right upper extremity weakness secondary to pain.  X-rays do not show any fracture.   Hypertension will keep patient on as needed IV hydralazine until patient can take home ARB orally. Chronic kidney disease stage III with mild hyponatremia creatinine at around baseline.  I think hyponatremia improved with fluids. Repeat CMP Alzheimer's dementia complicated by aphasia.         Subjective: Drowsy but arousable when wife calls name, wife says he has been sleeping most of the day. He was seen by Speech, remains NPO with ice chips  Physical  Exam: Vitals:   10/25/24 1100 10/25/24 1200 10/25/24 1207 10/25/24 1228  BP: (!) 146/93 123/89  139/80  Pulse: 86 75  69  Resp: 20 16  18   Temp:   98.8 F (37.1 C) 98 F (36.7 C)  TempSrc:      SpO2: 96% 97%  100%  Weight:      Height:      Eyes: Anicteric no pallor. ENMT: No discharge from the ears eyes nose or mouth. Neck: No mass felt.  No neck rigidity. Respiratory: No rhonchi or crepitations. Cardiovascular: S1-S2 heard. Abdomen: Soft nontender bowel sound present. Musculoskeletal: Right knee swollen and warm to touch.  Right upper extremity is difficult to move because of pain in the right shoulder. Skin: Chronic skin changes.  Mild ecchymosis in the right shoulder. Neurologic: Patient is lethargic and baseline is aphasic.  Responds to his name called by wife.  Able to move extremities right upper extremity admitted by pain. Psychiatric: Lethargic.  Data Reviewed:    CBC    Component Value Date/Time   WBC 13.9 (H) 10/24/2024 1652   RBC 4.85 10/24/2024 1652   HGB 16.7 10/24/2024 1700   HCT 49.0 10/24/2024 1700   PLT 278 10/24/2024 1652   MCV 98.4 10/24/2024 1652   MCH 34.2 (H) 10/24/2024 1652   MCHC 34.8 10/24/2024 1652   RDW 12.6 10/24/2024 1652   LYMPHSABS 1.4 10/24/2024 1652   MONOABS 1.4 (H) 10/24/2024 1652   EOSABS 0.0 10/24/2024 1652   BASOSABS 0.1 10/24/2024 1652    CMP     Component Value Date/Time   NA 133 (L) 10/24/2024 1700   K  4.1 10/24/2024 1700   CL 104 10/24/2024 1700   CO2 22 10/24/2024 1652   GLUCOSE 120 (H) 10/24/2024 1700   BUN 32 (H) 10/24/2024 1700   CREATININE 1.30 (H) 10/24/2024 1700   CALCIUM  9.0 10/24/2024 1652   PROT 6.5 10/24/2024 1652   ALBUMIN 2.7 (L) 10/24/2024 1652   AST 28 10/24/2024 1652   ALT 26 10/24/2024 1652   ALKPHOS 69 10/24/2024 1652   BILITOT 1.7 (H) 10/24/2024 1652   GFRNONAA 54 (L) 10/24/2024 1652    Family Communication: Updated with with imaging results and labs  Disposition: Status is:  Inpatient Remains inpatient appropriate   Planned Discharge Destination: Home    Time spent: 36 minutes  Author: Landon FORBES Baller, MD 10/25/2024 1:16 PM  For on call review [email or web address].

## 2024-10-25 NOTE — Plan of Care (Signed)

## 2024-10-25 NOTE — ED Notes (Signed)
 PT to MRI at this time

## 2024-10-26 ENCOUNTER — Inpatient Hospital Stay (HOSPITAL_COMMUNITY)

## 2024-10-26 DIAGNOSIS — M25461 Effusion, right knee: Secondary | ICD-10-CM | POA: Diagnosis not present

## 2024-10-26 DIAGNOSIS — J690 Pneumonitis due to inhalation of food and vomit: Secondary | ICD-10-CM | POA: Diagnosis not present

## 2024-10-26 DIAGNOSIS — G9341 Metabolic encephalopathy: Secondary | ICD-10-CM | POA: Diagnosis not present

## 2024-10-26 DIAGNOSIS — R4701 Aphasia: Secondary | ICD-10-CM | POA: Diagnosis not present

## 2024-10-26 DIAGNOSIS — M1711 Unilateral primary osteoarthritis, right knee: Secondary | ICD-10-CM | POA: Diagnosis not present

## 2024-10-26 LAB — COMPREHENSIVE METABOLIC PANEL WITH GFR
ALT: 93 U/L — ABNORMAL HIGH (ref 0–44)
AST: 144 U/L — ABNORMAL HIGH (ref 15–41)
Albumin: 2.4 g/dL — ABNORMAL LOW (ref 3.5–5.0)
Alkaline Phosphatase: 91 U/L (ref 38–126)
Anion gap: 12 (ref 5–15)
BUN: 25 mg/dL — ABNORMAL HIGH (ref 8–23)
CO2: 17 mmol/L — ABNORMAL LOW (ref 22–32)
Calcium: 8.3 mg/dL — ABNORMAL LOW (ref 8.9–10.3)
Chloride: 106 mmol/L (ref 98–111)
Creatinine, Ser: 1.26 mg/dL — ABNORMAL HIGH (ref 0.61–1.24)
GFR, Estimated: 57 mL/min — ABNORMAL LOW (ref >60)
Glucose, Bld: 117 mg/dL — ABNORMAL HIGH (ref 70–99)
Potassium: 3.7 mmol/L (ref 3.5–5.1)
Sodium: 135 mmol/L (ref 135–145)
Total Bilirubin: 1.3 mg/dL — ABNORMAL HIGH (ref 0.0–1.2)
Total Protein: 6.2 g/dL — ABNORMAL LOW (ref 6.5–8.1)

## 2024-10-26 LAB — CBC
HCT: 43.1 % (ref 39.0–52.0)
Hemoglobin: 14.6 g/dL (ref 13.0–17.0)
MCH: 33.4 pg (ref 26.0–34.0)
MCHC: 33.9 g/dL (ref 30.0–36.0)
MCV: 98.6 fL (ref 80.0–100.0)
Platelets: 317 K/uL (ref 150–400)
RBC: 4.37 MIL/uL (ref 4.22–5.81)
RDW: 12.5 % (ref 11.5–15.5)
WBC: 10.1 K/uL (ref 4.0–10.5)
nRBC: 0 % (ref 0.0–0.2)

## 2024-10-26 LAB — GLUCOSE, CAPILLARY: Glucose-Capillary: 110 mg/dL — ABNORMAL HIGH (ref 70–99)

## 2024-10-26 LAB — URIC ACID, BODY FLUID: Uric Acid Body Fluid: 3.3 mg/dL

## 2024-10-26 LAB — GLUCOSE, BODY FLUID OTHER: Glucose, Body Fluid Other: 84 mg/dL

## 2024-10-26 LAB — PROTEIN, BODY FLUID (OTHER): Total Protein, Body Fluid Other: 3.7 g/dL

## 2024-10-26 LAB — PROCALCITONIN: Procalcitonin: 0.68 ng/mL

## 2024-10-26 LAB — C-REACTIVE PROTEIN: CRP: 18.9 mg/dL — ABNORMAL HIGH (ref <1.0)

## 2024-10-26 LAB — SEDIMENTATION RATE: Sed Rate: 45 mm/h — ABNORMAL HIGH (ref 0–16)

## 2024-10-26 NOTE — Progress Notes (Signed)
 Speech Language Pathology Treatment: Dysphagia  Patient Details Name: Blake Atkins MRN: [id number] DOB: [birth year] Today's Date: 10/26/2024 Time: [phone] SLP Time Calculation (min) (ACUTE ONLY): 33 min  Assessment / Plan / Recommendation Clinical Impression  Although still lethargic, pt wakes up much more than he did on previous date. With continued stimulation, he stays alert enough to eat a container of applesauce with Mod cues. He also drank water with no overt s/s of aspiration. Even small pieces of solids leave oral residue, and he is a little resistant to opening his mouth for SLP to retrieve it. Pt's wife reports no overt difficulty swallowing until recently (primarily pocketing) and denies any h/o PNA. Given improvements in mentation, recommend starting PO diet, but would start with Dys 1 (puree) diet and thin liquids with full supervision and careful hand feeding.    HPI HPI: 83 yo presenting 10/29 with fever, cough, AMS. CXR without evidence of infection although more suggestive on CT. MRI Brain negative. Admitted with possible sepsis with concern for PNA. PMH significant for dementia complicated by expressive aphasia, HTN, CVA, HLD, CKD III. Pt recently admitted 10/25-10/27 s/p fall.      SLP Plan  Continue with current plan of care          Recommendations  Diet recommendations: Dysphagia 1 (puree);Thin liquid Liquids provided via: Cup;Straw Medication Administration: Crushed with puree Supervision: Staff to assist with self feeding;Full supervision/cueing for compensatory strategies Compensations: Minimize environmental distractions;Slow rate;Small sips/bites Postural Changes and/or Swallow Maneuvers: Seated upright 90 degrees                  Oral care BID     Dysphagia, unspecified (R13.10)     Continue with current plan of care     Leita SAILOR., M.A. CCC-SLP Acute Rehabilitation Services Office: [phone]  Secure chat preferred   10/26/2024,  4:16 PM

## 2024-10-26 NOTE — [place] FL2 (Signed)
 [place]  MEDICAID FL2 LEVEL OF CARE FORM     IDENTIFICATION  Patient Name: Blake Atkins Birthdate: 1941-04-03 Sex: male Admission Date (Current Location): 10/24/2024  [facility] and [place] Number:      Facility and Address:         Provider Number:    Attending Physician Name and Address:  Dibia, Landon BRAVO, MD  Relative Name and Phone Number:       Current Level of Care:   Recommended Level of Care:   Prior Approval Number:    Date Approved/Denied:   PASRR Number:    Discharge Plan:      Current Diagnoses: Patient Active Problem List   Diagnosis Date Noted   Gout 10/25/2024   Sepsis (HCC) 10/25/2024   Acute metabolic encephalopathy 10/25/2024   Pneumonia 10/24/2024   Altered mental status 08/14/2024   New onset seizure (HCC) 08/14/2024   History of dementia 08/14/2024   History of expressive aphasia 08/14/2024   Essential hypertension 08/14/2024   CKD stage 3b, GFR 30-44 ml/min (HCC) 08/14/2024   Syncope 08/14/2024   Aftercare following surgery of the circulatory system, NEC 01/18/2014   Occlusion and stenosis of carotid artery without mention of cerebral infarction 12/23/2011    Orientation RESPIRATION BLADDER Height & Weight            Weight: 86.5 kg Height:  5' 9 (175.3 cm)  BEHAVIORAL SYMPTOMS/MOOD NEUROLOGICAL BOWEL NUTRITION STATUS           AMBULATORY STATUS COMMUNICATION OF NEEDS Skin                               Personal Care Assistance Level of Assistance              Functional Limitations Info             SPECIAL CARE FACTORS FREQUENCY                       Contractures      Additional Factors Info                  Current Medications (10/26/2024):  This is the current hospital active medication list Current Facility-Administered Medications  Medication Dose Route Frequency Provider Last Rate Last Admin   acetaminophen  (TYLENOL ) tablet 650 mg  650 mg Oral Q6H PRN Franky Redia SAILOR, MD        Or   acetaminophen  (TYLENOL ) suppository 650 mg  650 mg Rectal Q6H PRN Kakrakandy, Arshad N, MD   650 mg at 10/26/24 0133   azithromycin (ZITHROMAX) 500 mg in sodium chloride  0.9 % 250 mL IVPB  500 mg Intravenous Q24H Franky Redia SAILOR, MD 250 mL/hr at 10/26/24 1002 500 mg at 10/26/24 1002   heparin  injection 5,000 Units  5,000 Units Subcutaneous Q8H Kakrakandy, Arshad N, MD   5,000 Units at 10/26/24 1520   hydrALAZINE (APRESOLINE) injection 5 mg  5 mg Intravenous Q4H PRN Kakrakandy, Arshad N, MD       piperacillin-tazobactam (ZOSYN) IVPB 3.375 g  3.375 g Intravenous Q8H Ledford, James L, RPH 12.5 mL/hr at 10/26/24 1516 3.375 g at 10/26/24 1516   vancomycin (VANCOREADY) IVPB 1250 mg/250 mL  1,250 mg Intravenous Q24H Ledford, James L, RPH 166.7 mL/hr at 10/26/24 1007 1,250 mg at 10/26/24 1007     Discharge Medications: Please see discharge summary for a list of discharge medications.  Relevant Imaging Results:  Relevant Lab Results:   Additional Information    Rosaline JONELLE Joe, RN

## 2024-10-26 NOTE — Progress Notes (Addendum)
 Progress Note   Patient: Blake Atkins FMW:[id number] DOB: 1941-01-17 DOA: 10/24/2024     2 DOS: the patient was seen and examined on 10/26/2024   Brief hospital course: 83 y.o. male with history of dementia complicated by expressive aphasia, hypertension, prior CVA, hyperlipidemia, chronic disease stage III, gout was brought to the ER after patient's wife found that patient was having fever  In the ER patient had a temperature of 101.2 F tachycardic with CT chest abdomen pelvis done shows features concerning for pneumonia.  Patient's right knee arthrocentesis with rare wbcs, no organisms seen.  Patient started on empiric antibiotics for possible pneumonia with concerning features for developing sepsis.    Assessment and Plan: Possible sepsis likely source could be aspiration pneumonia  Patient presented after a fall at home and was febrile and tachycardic at presentation concerning for sepsis. patient having persistent cough at home  CT Chest showing superimposed infiltrate within the posterior basal right lower lobe possibly infectious in the acute setting.concerning  for pneumonia.   Continue Vancomycin and Zosyn. Blood cx negative for 2 days    Continue hydration.  For now we will keep patient n.p.o. speech therapy to re-evaluate today.  WBC improving  Right knee arthritis likely from gout.  ER physician did have right knee arthrocentesis was done, no organism noted, rare WBC   fluid looked yellowish and not puslike. Obtain CT of right Knee to rule out septic knee. Check ESR, CRP, Procalcitonin. PT consulted   Acute metabolic encephalopathy likely from sepsis.  Closely monitor.  MRI brain with no acute pathology  Right upper extremity weakness secondary to pain.  X-rays do not show any fracture.    Hypertension will keep patient on as needed IV hydralazine until patient can take home ARB orally.   Chronic kidney disease stage IIIa with mild hyponatremia creatinine at around  baseline.  I think hyponatremia improved with fluids. Repeat CMP  Alzheimer's dementia complicated by aphasia.         Subjective: Drowsy but arousable when wife calls name, wife says he has been sleeping most of the day.Tmax 101.4 He was seen by Speech, remains NPO with ice chips  Physical Exam: Vitals:   10/25/24 1931 10/25/24 2343 10/26/24 0410 10/26/24 0908  BP: (!) 154/79 (!) 133/106 108/72 133/72  Pulse: 73 85 63 76  Resp: 18 (!) 22 18 19   Temp: 99.5 F (37.5 C) (!) 101.4 F (38.6 C) 97.8 F (36.6 C) 99.7 F (37.6 C)  TempSrc: Oral Axillary    SpO2: 99% 95% 98% 94%  Weight:      Height:      Gen: Acutely ill appearing, drowsy but easily arousable Respiratory: No rhonchi or crepitations. Cardiovascular: S1-S2 heard. Abdomen: Soft nontender bowel sound present. Musculoskeletal: Right knee swollen and warm to touch.  Right upper extremity is difficult to move because of pain in the right shoulder. Skin: Chronic skin changes.  Mild ecchymosis in the right shoulder. Neurologic: Patient is lethargic and baseline is aphasic.  Responds to his name called by wife.  Able to move extremities right upper extremity admitted by pain. Psychiatric: Lethargic.  Data Reviewed:    CBC    Component Value Date/Time   WBC 11.8 (H) 10/25/2024 1414   RBC 4.50 10/25/2024 1414   HGB 15.2 10/25/2024 1414   HCT 44.2 10/25/2024 1414   PLT 260 10/25/2024 1414   MCV 98.2 10/25/2024 1414   MCH 33.8 10/25/2024 1414   MCHC 34.4 10/25/2024 1414  RDW 12.7 10/25/2024 1414   LYMPHSABS 1.4 10/24/2024 1652   MONOABS 1.4 (H) 10/24/2024 1652   EOSABS 0.0 10/24/2024 1652   BASOSABS 0.1 10/24/2024 1652    CMP     Component Value Date/Time   NA 137 10/25/2024 1809   K 4.0 10/25/2024 1809   CL 105 10/25/2024 1809   CO2 22 10/25/2024 1809   GLUCOSE 105 (H) 10/25/2024 1809   BUN 22 10/25/2024 1809   CREATININE 1.31 (H) 10/25/2024 1809   CALCIUM  8.4 (L) 10/25/2024 1809   PROT 6.1 (L)  10/25/2024 1809   ALBUMIN 2.4 (L) 10/25/2024 1809   AST 39 10/25/2024 1809   ALT 36 10/25/2024 1809   ALKPHOS 67 10/25/2024 1809   BILITOT 1.0 10/25/2024 1809   GFRNONAA 54 (L) 10/25/2024 1809    Family Communication: Updated with with imaging results and labs  Disposition: Status is: Inpatient Remains inpatient appropriate   Planned Discharge Destination: Home    Time spent: 36 minutes  Author: Landon FORBES Baller, MD 10/26/2024 11:59 AM  For on call review [email or web address].

## 2024-10-26 NOTE — Progress Notes (Signed)
 Transition of Care [facility]) - Inpatient Brief Assessment   Patient Details  Name: Blake Atkins MRN: [id number] Date of Birth: 04-17-1941  [facility] CM/SW Contact:    Rosaline JONELLE Joe, RN Phone Number: 10/26/2024, 4:11 PM   Clinical Narrative: Patient admitted to the hospital with septicemia.  Patient was living at home with spouse and 24 hour caregivers through [employer] and [facility] services through [facility].  I spoke with the patient's wife at the bedside and she states that she has changed her mind and would like the patient to go to SNF - preferably [facility].    SNF workup started  - FL2 completed and patient was faxed out in the hub - including [facility] nursing facility.  I called and left a voice message and text to have Randine, CM with [facility] SN[facility] review.  DME at the home includes a hospital bed (thru [employer]), RW, Cane, shower seat, 3:1.  IP Care management team to follow up with the wife regarding available bed offers and SNF placement.   Transition of Care Asessment: Insurance and Status: (P) Insurance coverage has been reviewed Patient has primary care physician: (P) Yes Home environment has been reviewed: (P) from home with spouse, 24 hour care through [facility] Prior level of function:: (P) 24 hour care through [employer] thru [facility] policy,   Has HH through [employer] home health Prior/Current Home Services: (P) Current home services (active with [facility]) Social Drivers of Health Review: (P) SDOH reviewed needs interventions Readmission risk has been reviewed: (P) Yes Transition of care needs: (P) transition of care needs identified, TOC will continue to follow
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
## 2024-10-26 NOTE — Plan of Care (Signed)
  Problem: Education: Goal: Knowledge of General Education information will improve Description: Including pain rating scale, medication(s)/side effects and non-pharmacologic comfort measures Outcome: Not Progressing   Problem: Health Behavior/Discharge Planning: Goal: Ability to manage health-related needs will improve Outcome: Not Progressing   Problem: Clinical Measurements: Goal: Ability to maintain clinical measurements within normal limits will improve Outcome: Not Progressing Goal: Will remain free from infection Outcome: Not Progressing Goal: Diagnostic test results will improve Outcome: Not Progressing Goal: Respiratory complications will improve Outcome: Not Progressing Goal: Cardiovascular complication will be avoided Outcome: Not Progressing   Problem: Activity: Goal: Risk for activity intolerance will decrease Outcome: Not Progressing   Problem: Nutrition: Goal: Adequate nutrition will be maintained Outcome: Not Progressing   Problem: Coping: Goal: Level of anxiety will decrease Outcome: Not Progressing   Problem: Elimination: Goal: Will not experience complications related to bowel motility Outcome: Not Progressing   Problem: Pain Managment: Goal: General experience of comfort will improve and/or be controlled Outcome: Not Progressing   Problem: Safety: Goal: Ability to remain free from injury will improve Outcome: Not Progressing   Problem: Skin Integrity: Goal: Risk for impaired skin integrity will decrease Outcome: Not Progressing
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
## 2024-10-27 ENCOUNTER — Inpatient Hospital Stay (HOSPITAL_COMMUNITY)

## 2024-10-27 DIAGNOSIS — A419 Sepsis, unspecified organism: Secondary | ICD-10-CM | POA: Diagnosis not present

## 2024-10-27 DIAGNOSIS — R748 Abnormal levels of other serum enzymes: Secondary | ICD-10-CM | POA: Diagnosis not present

## 2024-10-27 DIAGNOSIS — M1120 Other chondrocalcinosis, unspecified site: Secondary | ICD-10-CM | POA: Diagnosis not present

## 2024-10-27 DIAGNOSIS — G9341 Metabolic encephalopathy: Secondary | ICD-10-CM | POA: Diagnosis not present

## 2024-10-27 DIAGNOSIS — N1832 Chronic kidney disease, stage 3b: Secondary | ICD-10-CM | POA: Diagnosis not present

## 2024-10-27 DIAGNOSIS — R7401 Elevation of levels of liver transaminase levels: Secondary | ICD-10-CM

## 2024-10-27 LAB — COMPREHENSIVE METABOLIC PANEL WITH GFR
ALT: 187 U/L — ABNORMAL HIGH (ref 0–44)
AST: 266 U/L — ABNORMAL HIGH (ref 15–41)
Albumin: 2.4 g/dL — ABNORMAL LOW (ref 3.5–5.0)
Alkaline Phosphatase: 129 U/L — ABNORMAL HIGH (ref 38–126)
Anion gap: 11 (ref 5–15)
BUN: 24 mg/dL — ABNORMAL HIGH (ref 8–23)
CO2: 20 mmol/L — ABNORMAL LOW (ref 22–32)
Calcium: 8.4 mg/dL — ABNORMAL LOW (ref 8.9–10.3)
Chloride: 105 mmol/L (ref 98–111)
Creatinine, Ser: 1.26 mg/dL — ABNORMAL HIGH (ref 0.61–1.24)
GFR, Estimated: 57 mL/min — ABNORMAL LOW (ref >60)
Glucose, Bld: 109 mg/dL — ABNORMAL HIGH (ref 70–99)
Potassium: 3.6 mmol/L (ref 3.5–5.1)
Sodium: 136 mmol/L (ref 135–145)
Total Bilirubin: 1.4 mg/dL — ABNORMAL HIGH (ref 0.0–1.2)
Total Protein: 6.2 g/dL — ABNORMAL LOW (ref 6.5–8.1)

## 2024-10-27 LAB — CBC
HCT: 41.5 % (ref 39.0–52.0)
Hemoglobin: 14.1 g/dL (ref 13.0–17.0)
MCH: 33.2 pg (ref 26.0–34.0)
MCHC: 34 g/dL (ref 30.0–36.0)
MCV: 97.6 fL (ref 80.0–100.0)
Platelets: 320 K/uL (ref 150–400)
RBC: 4.25 MIL/uL (ref 4.22–5.81)
RDW: 12.3 % (ref 11.5–15.5)
WBC: 7.9 K/uL (ref 4.0–10.5)
nRBC: 0 % (ref 0.0–0.2)

## 2024-10-27 LAB — ACETAMINOPHEN LEVEL: Acetaminophen (Tylenol), Serum: <10 ug/mL — ABNORMAL LOW (ref 10–30)

## 2024-10-27 LAB — HEPATITIS PANEL, ACUTE
HCV Ab: NONREACTIVE
Hep A IgM: NONREACTIVE
Hep B C IgM: NONREACTIVE
Hepatitis B Surface Ag: NONREACTIVE

## 2024-10-27 MED ORDER — POLYETHYL GLYC-PROPYL GLYC PF 0.4-0.3 % OP SOLN: 1.0000 [drp] | Freq: Two times a day (BID) | OPHTHALMIC | Status: DC | PRN

## 2024-10-27 MED ORDER — DONEPEZIL HCL 10 MG PO TABS
10.0000 mg | ORAL_TABLET | Freq: Every day | ORAL | Status: DC
  Administered 2024-10-27 – 2024-10-30 (×3): 10 mg via ORAL
  Filled 2024-10-27 (×3): qty 1

## 2024-10-27 MED ORDER — ENSURE PLUS HIGH PROTEIN PO LIQD
237.0000 mL | Freq: Two times a day (BID) | ORAL | Status: DC
  Administered 2024-10-28 – 2024-10-31 (×6): 237 mL via ORAL

## 2024-10-27 MED ORDER — SODIUM CHLORIDE 0.9 % IV SOLN
2.0000 g | INTRAVENOUS | Status: AC
  Administered 2024-10-27 – 2024-10-30 (×4): 2 g via INTRAVENOUS
  Filled 2024-10-27 (×4): qty 20

## 2024-10-27 MED ORDER — DICLOFENAC SODIUM 1 % EX GEL
2.0000 g | Freq: Four times a day (QID) | CUTANEOUS | Status: DC
  Administered 2024-10-27 – 2024-10-31 (×13): 2 g via TOPICAL
  Filled 2024-10-27: qty 100

## 2024-10-27 MED ORDER — ARTIFICIAL TEARS OPHTHALMIC OINT: TOPICAL_OINTMENT | Freq: Two times a day (BID) | OPHTHALMIC | Status: DC | PRN

## 2024-10-27 MED ORDER — KETOROLAC TROMETHAMINE 15 MG/ML IJ SOLN
15.0000 mg | Freq: Once | INTRAMUSCULAR | Status: AC
  Administered 2024-10-27: 15 mg via INTRAVENOUS
  Filled 2024-10-27: qty 1

## 2024-10-27 NOTE — Plan of Care (Signed)

## 2024-10-27 NOTE — Progress Notes (Addendum)
 Progress Note   Patient: Blake Atkins FMW:[id number] DOB: 03/31/1941 DOA: 10/24/2024  DOS: the patient was seen and examined on 10/27/2024   Brief hospital course:  83 y.o. male with history of dementia complicated by expressive aphasia, hypertension, prior CVA, hyperlipidemia, chronic disease stage III, gout was brought to the ER after patient's wife found that patient was having fever  In the ER patient had a temperature of 101.2 F tachycardic with CT chest abdomen pelvis done shows features concerning for pneumonia.  Patient's right knee arthrocentesis with rare wbcs, no organisms seen.  Patient started on empiric antibiotics for possible pneumonia with concerning features for developing sepsis.    Assessment and Plan:  Concern for sepsis - Tachycardia, leukocytosis, fever, CT noting possible aspiration given concern for sepsis on presentation.  Blood cultures, empiric antibiotics, IV fluid bolus given.  Monitoring blood pressure closely.  Possible aspiration pneumonia - Persistent cough prior to presentation.  CT chest noting infarct treated in the RLL suspicious for aspiration or pneumonia.  Leukocytosis resolved.  Receiving empiric Zosyn, azithromycin, vancomycin.  Transitioning Zosyn to ceftriaxone.  MRSA nares swab still pending, will likely DC vancomycin.  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  Right knee arthrocentesis performed.  Studies noting leukocytosis but low WBCs, no organisms.  Noted calcium  pyrophosphate crystals.  Will order topical Voltaren gel as well as one-time order of Toradol.  Limit NSAID use given CKD.  Metabolic encephalopathy/Alzheimer's dementia/aphasia - Multifactorial given underlying expressive aphasia, dementia, CVA, sepsis.  Appears to be showing improvement..  Talking, alert.  Will continue to monitor closely.  CKD 3 - Creatinine appears to be around baseline.  Will recheck BMP  in AM.  Acute transaminitis - Elevated liver enzymes last 2 days, trending upwards.  Etiology unclear at this point.  Differentials include cholestatic, DILI, viral, autoimmune.  CT abdomen on presentation unremarkable.  Right upper quadrant ultrasound ordered this morning noting no acute blockage, no Murphy sign but limited study.  Patient with no leukocytosis, abdominal pain, fever.  Should LFTs increase, may warrant repeat CT abdomen.  Zosyn only current med that could cause DILI.  Will swap with ceftriaxone for now.  Will recheck LFTs in AM.  Will order hepatitis panel and acetaminophen  level.  Subjective: Evaluated after returning from ultrasound.  Appears comfortable but disheveled.  Expressive aphasia but otherwise alert and responsive.  Denies pain, fever, nausea, vomiting.  Was able to speak with patient's wife earlier in the day as well.  Updated her on plan for the day.  Physical Exam:  Vitals:   10/26/24 2323 10/27/24 0306 10/27/24 0818 10/27/24 1212  BP: (!) 153/89 (!) 144/74 (!) 162/81 (!) 154/98  Pulse: 74 83 77 67  Resp: 18 18    Temp: 98.7 F (37.1 C) 98.4 F (36.9 C) 99.9 F (37.7 C) 99.2 F (37.3 C)  TempSrc:  Oral    SpO2: 97% 95% 94% 98%  Weight:      Height:        GENERAL:  Alert, pleasant, disheveled HEENT:  EOMI CARDIOVASCULAR:  RRR, no murmurs appreciated RESPIRATORY: Poor air movement bilaterally GASTROINTESTINAL:  Soft, nontender, nondistended EXTREMITIES: Right knee edematous NEURO: Expressive aphasia, no new focal deficits appreciated SKIN:  No rashes noted PSYCH:  Appropriate mood and affect     Data Reviewed:  Imaging Studies: US  Abdomen Limited RUQ (LIVER/GB) Result Date: 10/27/2024 EXAM: Right Upper Quadrant Abdominal Ultrasound 10/27/2024 09:23:55 AM CLINICAL HISTORY: Elevated liver  enzymes. TECHNIQUE: Real-time ultrasonography of the right upper quadrant of the abdomen was performed. COMPARISON: None available. FINDINGS: LIVER: Visualized  liver is somewhat echogenic which may be from hepatic steatosis. Poor visualization of the left hepatic lobe. No intrahepatic biliary ductal dilatation. No evidence of mass. BILIARY SYSTEM: No pericholecystic fluid. Gallbladder wall thickness measures 2.2 mm. No cholelithiasis. Negative sonographic Murphy's sign. Common bile duct measures 2.5 mm. OTHER: No right upper quadrant ascites. LIMITATIONS/ARTIFACTS: Reduced diagnostic sensitivity and specificity due to patient inability to cooperate during exams. For example, left lateral decubitus images could not be obtained. IMPRESSION: 1. Echogenic liver suggests hepatic steatosis; poor visualization of the left hepatic lobe limiting assessment. 2. Limited examination due to patient inability to cooperate, which reduces diagnostic sensitivity and specificity. Electronically signed by: Ryan Salvage MD 10/27/2024 11:06 AM EDT RP Workstation: [id number]   CT KNEE RIGHT WO CONTRAST Result Date: 10/27/2024 EXAM: CT RIGHT KNEE, WITHOUT IV CONTRAST 10/26/2024 01:30:00 PM TECHNIQUE: Axial images were acquired through the right knee without IV contrast. Reformatted images were reviewed. Automated exposure control, iterative reconstruction, and/or weight based adjustment of the mA/kV was utilized to reduce the radiation dose to as low as reasonably achievable. COMPARISON: CT Right Knee 08/14/2024. CLINICAL HISTORY: Altered mental status, septic arthritis suspected. Rule out septic knee. FINDINGS: BONES: No acute fracture or acute bony findings identified. Small jets along the tibial spine. JOINTS: No dislocation. Prominent tricompartmental osteophyte formation with severe patellofemoral articular space narrowing, moderate to severe medial compartmental articular space narrowing, and moderate suspected chondral thinning in the lateral compartment. Extensive chondrocalcinosis including the menisci and along the joint capsule and hyaline cartilage surfaces. This could be a  manifestation of CPPD arthropathy. Small free osteochondral fragments not excluded. Moderate knee effusion, similar to previous. No obvious change in the degree of synovial thickening along the suprapatellar bursa compared to 08/14/2024. No gas is present in the joint. SOFT TISSUES: The soft tissues are unremarkable. IMPRESSION: 1. Moderate knee effusion without interval change. 2. Severe tricompartmental osteoarthritis, including severe patellofemoral and moderate to severe medial compartment joint space narrowing with suspected lateral compartment chondral thinning. 3. Extensive chondrocalcinosis, which may reflect CPPD arthropathy. 4. No acute osseous abnormality. Electronically signed by: Ryan Salvage MD 10/27/2024 11:02 AM EDT RP Workstation: [id number]   CT SHOULDER RIGHT WO CONTRAST Result Date: 10/25/2024 CLINICAL DATA:  Osteomyelitis suspected, shoulder, xray done Right shoulder bruising.  Altered mental status. EXAM: CT OF THE UPPER RIGHT EXTREMITY WITHOUT CONTRAST TECHNIQUE: Multidetector CT imaging of the right shoulder was performed according to the standard protocol. RADIATION DOSE REDUCTION: This exam was performed according to the departmental dose-optimization program which includes automated exposure control, adjustment of the mA and/or kV according to patient size and/or use of iterative reconstruction technique. COMPARISON:  Radiographs and chest CTA 10/24/2024. No other comparison studies. FINDINGS: Bones/Joint/Cartilage No evidence of acute fracture, dislocation or humeral head osteonecrosis. No cortical destruction or erosive changes demonstrated to suggest osteomyelitis or septic arthritis. There are mild glenohumeral and moderate acromioclavicular degenerative changes with multifocal chondrocalcinosis. No significant shoulder joint effusion apparent. Ligaments Suboptimally assessed by CT. Muscles and Tendons Possible mild atrophy of the subscapularis muscle. The rotator cuff  otherwise appears unremarkable as evaluated by noncontrast CT. Soft tissues No evidence of periarticular fluid collection, foreign body, soft tissue emphysema or significant inflammation. Persistent patchy airspace disease in the right lower lobe which may reflect atelectasis or an infiltrate. IMPRESSION: 1. No CT evidence of osteomyelitis or septic arthritis. 2. Mild glenohumeral and moderate  acromioclavicular degenerative changes with multifocal chondrocalcinosis. 3. Possible mild atrophy of the subscapularis muscle. 4. Persistent patchy airspace disease in the right lower lobe which may reflect atelectasis or an infiltrate. Electronically Signed   By: Elsie Perone M.D.   On: 10/25/2024 08:57   MR CERVICAL SPINE W WO CONTRAST Result Date: 10/25/2024 EXAM: MRI CERVICAL SPINE WITHOUT AND WITH CONTRAST 10/25/2024 02:51:36 AM TECHNIQUE: Multiplanar multisequence MRI of the cervical spine was performed with and without intravenous contrast. 9mL gadobutrol (GADAVIST) 1 MMOL/ML injection. COMPARISON: CT from 10/20/2024. CLINICAL HISTORY: Right upper extremity weakness. FINDINGS: LIMITATIONS/ARTIFACTS: Examination moderately degraded by motion artifact. BONES AND ALIGNMENT: Straightening of the normal cervical lordosis. Normal vertebral body heights. Bone marrow signal is unremarkable. SPINAL CORD: Normal spinal cord size. No abnormal spinal cord signal. SOFT TISSUES: Mild edema within the posterior paraspinous musculature at the level of C3-C4, nonspecific, but could reflect changes of muscular injury and or strain. No paraspinal mass. C2-C3: Left sided uncovertebral spurring without significant disc bulge. Mild left sided facet hypertrophy. No spinal stenosis. Mild left C3 foraminal narrowing. C3-C4: Diffuse disc bulge with bilateral uncovertebral spurring. Mild left greater than right facet hypertrophy. Mild spinal stenosis. Severe left with moderate right C4 foraminal narrowing. C4-C5: Left paracentral disc  protrusion and density ventral thecal sac. Mild cord flattening but no visible cord signal changes. Superimposed bilateral facet hypertrophy. Resultant in mild to moderate spinal stenosis. Severe left C5 foraminal narrowing. Right neural foramen appears patent. C5-C6: Left paracentral disc protrusion flattens the ventral thecal sac. Mild cord flattening without visible cord signal changes. Right greater than left facet hypertrophy. Moderate spinal stenosis with moderate left C6 foraminal narrowing. Right neural foramen appears patent. C6-C7: Degenerative vertebral disc space narrowing. A broad posterior disc osteophyte mildly flattens the ventral thecal sac. Mild spinal stenosis. Mild right greater than left C7 foraminal narrowing. C7-T1: Negative interspace. Mild bilateral facet hypertrophy. No spinal stenosis. Mild left C8 foraminal narrowing. IMPRESSION: 1. Mild edema within the posterior paraspinous musculature at C3-C4, which may reflect muscular injury or strain. 2. No other acute.  abnormality within the cervical spine. 3. Multilevel cervical spondylosis with resultant mild to moderate diffuse spinal stenosis at C3-C4 through C6 c7. Associated severe left with moderate right C4 foraminal stenosis, severe left C5 foraminal narrowing, with moderate left C6 foraminal stenosis. Electronically signed by: Morene Hoard MD 10/25/2024 03:23 AM EDT RP Workstation: [id number]   MR BRAIN WO CONTRAST Result Date: 10/25/2024 EXAM: MRI BRAIN WITHOUT CONTRAST 10/25/2024 02:51:14 AM TECHNIQUE: Multiplanar multisequence MRI of the head/brain was performed without the administration of intravenous contrast. Examination degraded by motion. COMPARISON: Comparison with prior CT from 10/24/2024. CLINICAL HISTORY: Neuro deficit, acute, stroke suspected. FINDINGS: BRAIN AND VENTRICLES: No acute infarct. No intracranial hemorrhage. No mass. No midline shift. Generalized age-related atrophy. Patchy T2/FLAIR hyperintensity  involving the periventricular and deep white matter, consistent with chronic cerebral ischemic disease, moderate in nature. Mild ventricular prominence related to global parenchymal volume loss without hydrocephalus. The sella is unremarkable. Normal flow voids. ORBITS: Prior bilateral ocular lens replacement. SINUSES AND MASTOIDS: Trace bilateral mastoid effusions noted, of indeterminate significance. BONES AND SOFT TISSUES: Normal marrow signal. No acute soft tissue abnormality. IMPRESSION: 1. No acute intracranial abnormality. 2. Age-related cerebral atrophy with moderate chronic microvascular ischemic disease. Electronically signed by: Morene Hoard MD 10/25/2024 03:12 AM EDT RP Workstation: [id number]   CT Angio Chest PE W and/or Wo Contrast Result Date: 10/24/2024 EXAM: CTA CHEST PE WITHOUT AND WITH CONTRAST CT ABDOMEN AND PELVIS  WITHOUT AND WITH CONTRAST 10/24/2024 08:56:53 PM TECHNIQUE: CTA of the chest was performed after the administration of 75 mL of iohexol (OMNIPAQUE) 350 MG/ML injection. Multiplanar reformatted images are provided for review. MIP images are provided for review. CT of the abdomen and pelvis was performed with the administration of intravenous contrast. Automated exposure control, iterative reconstruction, and/or weight based adjustment of the mA/kV was utilized to reduce the radiation dose to as low as reasonably achievable. COMPARISON: Comparison is made to 07/05/2023. CLINICAL HISTORY: Pulmonary embolism (PE) suspected, high prob; fever, pneumonia. FINDINGS: CHEST: PULMONARY ARTERIES: Pulmonary arteries are adequately opacified for evaluation. No intraluminal filling defect to suggest pulmonary embolism. Central pulmonary arteries are of normal caliber. Main pulmonary artery is normal in caliber. MEDIASTINUM: No mediastinal lymphadenopathy. The heart demonstrates mild coronary artery calcification and global cardiac size is mildly enlarged. No pericardial effusion. Fusiform  aneurysm of the ascending aorta measuring 4.3 cm in maximal diameter. Descending thoracic aorta is of normal caliber. Mild atherosclerotic calcification within the thoracic aorta. LUNGS AND PLEURA: Bibasilar atelectasis. Superimposed infiltrate within the posterior basal right lower lobe, possibly infectious in the acute setting. No pneumothorax or pleural effusion. SOFT TISSUES AND BONES: No acute bone or soft tissue abnormality. ABDOMEN AND PELVIS: LIVER: The liver is unremarkable. GALLBLADDER AND BILE DUCTS: Gallbladder is unremarkable. No biliary ductal dilatation. SPLEEN: Spleen demonstrates no acute abnormality. PANCREAS: Pancreas demonstrates no acute abnormality. ADRENAL GLANDS: Adrenal glands demonstrate no acute abnormality. KIDNEYS, URETERS AND BLADDER: Stable simple cortical cysts within the right kidney. The kidneys are otherwise unremarkable. No stones in the kidneys or ureters. No hydronephrosis. No perinephric or periureteral stranding. Urinary bladder is unremarkable. GI AND BOWEL: Appendix absent. The stomach, small bowel, and large bowel are otherwise unremarkable. There is no bowel obstruction. No abnormal bowel wall thickening or distension. REPRODUCTIVE: Reproductive organs are unremarkable. PERITONEUM AND RETROPERITONEUM: No ascites or free air. LYMPH NODES: No lymphadenopathy. BONES AND SOFT TISSUES: Osseous structures are age appropriate. No acute bone abnormality. No lytic or blastic bone lesion. Mild aortoiliac atherosclerotic calcification. No aortic aneurysm. No focal soft tissue abnormality. RAF SCORE: Aortic atherosclerosis (icd10-i70.0), aortic aneurysm (icd10-i71.9) IMPRESSION: 1. No pulmonary embolism. 2. Superimposed infiltrate within the posterior basal right lower lobe, possibly infectious in the acute setting. 3. Fusiform aneurysm of the ascending thoracic aorta measuring 4.3 cm in maximal diameter. Recommend annual imaging follow-up by CTA or mra. This recommendation follows  2010 accf/aha/aats/ACR/asa/sca/scai/sir/sts/svm guidelines for the diagnosis and management of patients with thoracic aortic disease. Circulation. 2010; 121: Z733-z630. Aortic aneurysm nos (icd10-i71.9) 4. raf score: Aortic atherosclerosis (icd10-i70.0); aortic aneurysm (pri89-p28.9) Electronically signed by: Dorethia Molt MD 10/24/2024 09:31 PM EDT RP Workstation: [id number]   CT ABDOMEN PELVIS W CONTRAST Result Date: 10/24/2024 EXAM: CTA CHEST PE WITHOUT AND WITH CONTRAST CT ABDOMEN AND PELVIS WITHOUT AND WITH CONTRAST 10/24/2024 08:56:53 PM TECHNIQUE: CTA of the chest was performed after the administration of 75 mL of iohexol (OMNIPAQUE) 350 MG/ML injection. Multiplanar reformatted images are provided for review. MIP images are provided for review. CT of the abdomen and pelvis was performed with the administration of intravenous contrast. Automated exposure control, iterative reconstruction, and/or weight based adjustment of the mA/kV was utilized to reduce the radiation dose to as low as reasonably achievable. COMPARISON: Comparison is made to 07/05/2023. CLINICAL HISTORY: Pulmonary embolism (PE) suspected, high prob; fever, pneumonia. FINDINGS: CHEST: PULMONARY ARTERIES: Pulmonary arteries are adequately opacified for evaluation. No intraluminal filling defect to suggest pulmonary embolism. Central pulmonary arteries are of normal  caliber. Main pulmonary artery is normal in caliber. MEDIASTINUM: No mediastinal lymphadenopathy. The heart demonstrates mild coronary artery calcification and global cardiac size is mildly enlarged. No pericardial effusion. Fusiform aneurysm of the ascending aorta measuring 4.3 cm in maximal diameter. Descending thoracic aorta is of normal caliber. Mild atherosclerotic calcification within the thoracic aorta. LUNGS AND PLEURA: Bibasilar atelectasis. Superimposed infiltrate within the posterior basal right lower lobe, possibly infectious in the acute setting. No pneumothorax or  pleural effusion. SOFT TISSUES AND BONES: No acute bone or soft tissue abnormality. ABDOMEN AND PELVIS: LIVER: The liver is unremarkable. GALLBLADDER AND BILE DUCTS: Gallbladder is unremarkable. No biliary ductal dilatation. SPLEEN: Spleen demonstrates no acute abnormality. PANCREAS: Pancreas demonstrates no acute abnormality. ADRENAL GLANDS: Adrenal glands demonstrate no acute abnormality. KIDNEYS, URETERS AND BLADDER: Stable simple cortical cysts within the right kidney. The kidneys are otherwise unremarkable. No stones in the kidneys or ureters. No hydronephrosis. No perinephric or periureteral stranding. Urinary bladder is unremarkable. GI AND BOWEL: Appendix absent. The stomach, small bowel, and large bowel are otherwise unremarkable. There is no bowel obstruction. No abnormal bowel wall thickening or distension. REPRODUCTIVE: Reproductive organs are unremarkable. PERITONEUM AND RETROPERITONEUM: No ascites or free air. LYMPH NODES: No lymphadenopathy. BONES AND SOFT TISSUES: Osseous structures are age appropriate. No acute bone abnormality. No lytic or blastic bone lesion. Mild aortoiliac atherosclerotic calcification. No aortic aneurysm. No focal soft tissue abnormality. RAF SCORE: Aortic atherosclerosis (icd10-i70.0), aortic aneurysm (icd10-i71.9) IMPRESSION: 1. No pulmonary embolism. 2. Superimposed infiltrate within the posterior basal right lower lobe, possibly infectious in the acute setting. 3. Fusiform aneurysm of the ascending thoracic aorta measuring 4.3 cm in maximal diameter. Recommend annual imaging follow-up by CTA or mra. This recommendation follows 2010 accf/aha/aats/ACR/asa/sca/scai/sir/sts/svm guidelines for the diagnosis and management of patients with thoracic aortic disease. Circulation. 2010; 121: [id number]. Aortic aneurysm nos (icd10-i71.9) 4. raf score: Aortic atherosclerosis (icd10-i70.0); aortic aneurysm (pri89-p28.9) Electronically signed by: Dorethia Molt MD 10/24/2024 09:31 PM EDT  RP Workstation: [id number]   CT Head Wo Contrast Result Date: 10/24/2024 CLINICAL DATA:  Mental status change EXAM: CT HEAD WITHOUT CONTRAST TECHNIQUE: Contiguous axial images were obtained from the base of the skull through the vertex without intravenous contrast. RADIATION DOSE REDUCTION: This exam was performed according to the departmental dose-optimization program which includes automated exposure control, adjustment of the mA and/or kV according to patient size and/or use of iterative reconstruction technique. COMPARISON:  CT brain 10/20/2024 FINDINGS: Brain: No acute territorial infarction, hemorrhage or intracranial mass. Atrophy and chronic small vessel ischemic changes of the white matter. Stable ventricle size. Vascular: No hyperdense vessels.  No unexpected calcification Skull: Normal. Negative for fracture or focal lesion. Sinuses/Orbits: Mild mucosal thickening in the sinuses Other: Small none IMPRESSION: 1. No CT evidence for acute intracranial abnormality. 2. Atrophy and chronic small vessel ischemic changes of the white matter. Electronically Signed   By: Luke Bun M.D.   On: 10/24/2024 17:33   DG Shoulder Right Portable Result Date: 10/24/2024 CLINICAL DATA:  Altered mental status with new onset incontinence. EXAM: RIGHT SHOULDER - 1 VIEW COMPARISON:  None Available. FINDINGS: Mild-to-moderate degenerative change of the [facility] joint. Minimal degenerate change of the glenohumeral joint. No evidence of acute fracture or dislocation. Remainder of the exam is unremarkable. IMPRESSION: 1. No acute findings. 2. Degenerative changes as described. Electronically Signed   By: Toribio Agreste M.D.   On: 10/24/2024 17:23   DG Chest Port 1 View Result Date: 10/24/2024 CLINICAL DATA:  Sepsis.  Altered mental  status. EXAM: PORTABLE CHEST 1 VIEW COMPARISON:  10/20/2024 FINDINGS: Lungs are hypoinflated and otherwise clear. Cardiomediastinal silhouette and remainder of the exam is unchanged. IMPRESSION:  Hypoinflation without acute cardiopulmonary disease. Electronically Signed   By: Toribio Agreste M.D.   On: 10/24/2024 17:22   CT Cervical Spine Wo Contrast Result Date: 10/20/2024 EXAM: CT CERVICAL SPINE WITHOUT CONTRAST 10/20/2024 08:10:38 AM TECHNIQUE: CT of the cervical spine was performed without the administration of intravenous contrast. Multiplanar reformatted images are provided for review. Automated exposure control, iterative reconstruction, and/or weight based adjustment of the mA/kV was utilized to reduce the radiation dose to as low as reasonably achievable. COMPARISON: CT face and CT head today reported separately. CLINICAL HISTORY: 83 year old male. Neck trauma, multiple falls this week, bruised eye, AMS at baseline, no complaints. FINDINGS: CERVICAL SPINE: BONES AND ALIGNMENT: Maintained cervical lordosis. Normal bone mineralization for age. No acute fracture or traumatic malalignment. DEGENERATIVE CHANGES: Widespread degenerative cervical disc calcification. Calcified degenerative ligamentous hypertrophy about the odontoid. CT suggests mild degenerative cervical spinal stenosis. SOFT TISSUES: No prevertebral soft tissue swelling. Bulky left ICA calcified atherosclerosis. Negative visible noncontrast thoracic inlet. IMPRESSION: 1. No acute traumatic injury identified in the cervical spine. 2. Cervical disc and ligamentous degeneration. 3. left ICA atherosclerosis. Electronically signed by: Helayne Hurst MD 10/20/2024 08:25 AM EDT RP Workstation: [id number]   CT Maxillofacial Wo Contrast Result Date: 10/20/2024 EXAM: CT OF THE FACE WITHOUT CONTRAST 10/20/2024 08:10:38 AM TECHNIQUE: CT of the face was performed without the administration of intravenous contrast. Multiplanar reformatted images are provided for review. Automated exposure control, iterative reconstruction, and/or weight based adjustment of the mA/kV was utilized to reduce the radiation dose to as low as reasonably achievable.  COMPARISON: Head CT reported separately today. CLINICAL HISTORY: 83 year old male. Facial trauma, blunt; R periorbital trauma. Patient BIB EMS from home, where he lives with his daughter, after multiple falls this week. Patient had one fall where he has a bruised eye from. FINDINGS: FACIAL BONES: No acute facial fracture. No mandibular dislocation. No suspicious bone lesion. Underlying facial bones intact. ORBITS: Globes are intact. Postoperative changes to both globes. No acute traumatic injury. No inflammatory change. SINUSES AND MASTOIDS: Paranasal sinuses, middle ears and mastoids well aerated. SOFT TISSUES: Superficial right infraorbital, premalar soft tissue swelling and stranding on series 3 image 25. No soft tissue gas identified. VASCULATURE: Mild for age right carotid, moderate to severe left ICA calcified atherosclerosis in the visible neck. CERVICAL SPINE: Partially visible cervical spine degeneration. IMPRESSION: 1. Superficial right infraorbital soft tissue injury. 2. No facial bone fracture. 3. 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 Electronically signed by: Helayne Hurst MD 10/20/2024 08:23 AM EDT RP Workstation: [id number]   CT Head Wo Contrast Result Date: 10/20/2024 EXAM: CT HEAD WITHOUT CONTRAST 10/20/2024 08:10:38 AM TECHNIQUE: CT of the head was performed without the administration of intravenous contrast. Automated exposure control, iterative reconstruction, and/or weight based adjustment of the mA/kV was utilized to reduce the radiation dose to as low as reasonably achievable. COMPARISON: CT Head, 08/13/2024. CLINICAL HISTORY: 83 Year Old Male, Head Trauma. FINDINGS: BRAIN AND VENTRICLES: Stable brain volume. Stable, patchy, and confluent cerebral white matter hypodensity, most pronounced in the left superior frontal gyrus posteriorly, perirolandic subcortical white matter. No convincing cortical encephalomalacia. No acute hemorrhage. No evidence of  acute infarct. No hydrocephalus. No extra-axial collection. No mass effect or midline shift. ORBITS: No acute abnormality. SINUSES: No acute abnormality. SOFT TISSUES AND SKULL:  No acute soft tissue abnormality. No skull fracture. IMPRESSION: 1. No acute traumatic injury. 2. Stable white matter disease. Electronically signed by: Helayne Hurst MD 10/20/2024 08:19 AM EDT RP Workstation: [id number]   DG Pelvis Portable Result Date: 10/20/2024 EXAM: 1 or 2 VIEW(S) XRAY OF THE PELVIS 10/20/2024 07:53:00 AM COMPARISON: CT abdomen and pelvis 07/05/2023. CLINICAL HISTORY: 83 year old male with multiple falls this week, bruised eye, and AMS at baseline. FINDINGS: BONES AND JOINTS: No acute fracture. No focal osseous lesion. No joint dislocation. SOFT TISSUES: The soft tissues are unremarkable. ABDOMINAL CONTENTS: Negative visible bowel gas pattern. IMPRESSION: 1. No acute fracture or dislocation identified about the pelvis. Electronically signed by: Helayne Hurst MD 10/20/2024 08:03 AM EDT RP Workstation: [id number]   DG Chest Portable 1 View Result Date: 10/20/2024 EXAM: 1 VIEW XRAY OF THE CHEST 10/20/2024 07:53:00 AM COMPARISON: Portable chest x ray 08/13/2024. CLINICAL HISTORY: 83 year old male. Multiple falls this week, including one causing a bruised eye. Patient is not on thinners. AMS at baseline. No complaints. FINDINGS: LUNGS AND PLEURA: Lower lung volumes. No focal pulmonary opacity. No pulmonary edema. No pleural effusion. No pneumothorax. HEART AND MEDIASTINUM: No acute abnormality of the cardiac and mediastinal silhouettes. BONES AND SOFT TISSUES: No acute osseous abnormality. IMPRESSION: 1. lower lung volumes.  No acute cardiopulmonary abnormality. Electronically signed by: Helayne Hurst MD 10/20/2024 08:02 AM EDT RP Workstation: [id number]    There are no new results to review at this time.  Previous records (including but not limited to H&P, progress notes, nursing notes, TOC management) were reviewed  in assessment of this patient.  Labs: CBC: Recent Labs  Lab 10/24/24 1652 10/24/24 1700 10/25/24 1414 10/26/24 1209 10/27/24 0548  WBC 13.9*  --  11.8* 10.1 7.9  NEUTROABS 10.9*  --   --   --   --   HGB 16.6 16.7 15.2 14.6 14.1  HCT 47.7 49.0 44.2 43.1 41.5  MCV 98.4  --  98.2 98.6 97.6  PLT 278  --  260 317 320   Basic Metabolic Panel: Recent Labs  Lab 10/24/24 1652 10/24/24 1700 10/25/24 1809 10/26/24 1209 10/27/24 0548  NA 134* 133* 137 135 136  K 4.3 4.1 4.0 3.7 3.6  CL 100 104 105 106 105  CO2 22  --  22 17* 20*  GLUCOSE 123* 120* 105* 117* 109*  BUN 28* 32* 22 25* 24*  CREATININE 1.31* 1.30* 1.31* 1.26* 1.26*  CALCIUM  9.0  --  8.4* 8.3* 8.4*   Liver Function Tests: Recent Labs  Lab 10/24/24 1652 10/25/24 1809 10/26/24 1209 10/27/24 0548  AST 28 39 144* 266*  ALT 26 36 93* 187*  ALKPHOS 69 67 91 129*  BILITOT 1.7* 1.0 1.3* 1.4*  PROT 6.5 6.1* 6.2* 6.2*  ALBUMIN 2.7* 2.4* 2.4* 2.4*   CBG: Recent Labs  Lab 10/25/24 1158 10/26/24 0632  GLUCAP 125* 110*    Scheduled Meds:  heparin   5,000 Units Subcutaneous Q8H   Continuous Infusions:  azithromycin 500 mg (10/27/24 1134)   piperacillin-tazobactam (ZOSYN)  IV 3.375 g (10/27/24 0619)   vancomycin 1,250 mg (10/27/24 1135)   PRN Meds:.acetaminophen  **OR** acetaminophen , hydrALAZINE  Family Communication: Wife at bedside  Disposition: Status is: Inpatient Remains inpatient appropriate because: Transaminitis, sepsis     Time spent: 50 minutes  Length of inpatient stay: 3 days  Author: Carliss LELON Canales, DO 10/27/2024 1:13 PM  For on call review [email or web address].

## 2024-10-28 DIAGNOSIS — J690 Pneumonitis due to inhalation of food and vomit: Secondary | ICD-10-CM | POA: Diagnosis not present

## 2024-10-28 DIAGNOSIS — R4701 Aphasia: Secondary | ICD-10-CM | POA: Diagnosis not present

## 2024-10-28 DIAGNOSIS — G9341 Metabolic encephalopathy: Secondary | ICD-10-CM | POA: Diagnosis not present

## 2024-10-28 DIAGNOSIS — A419 Sepsis, unspecified organism: Secondary | ICD-10-CM | POA: Diagnosis not present

## 2024-10-28 LAB — COMPREHENSIVE METABOLIC PANEL WITH GFR
ALT: 222 U/L — ABNORMAL HIGH (ref 0–44)
AST: 235 U/L — ABNORMAL HIGH (ref 15–41)
Albumin: 2.2 g/dL — ABNORMAL LOW (ref 3.5–5.0)
Alkaline Phosphatase: 146 U/L — ABNORMAL HIGH (ref 38–126)
Anion gap: 9 (ref 5–15)
BUN: 21 mg/dL (ref 8–23)
CO2: 22 mmol/L (ref 22–32)
Calcium: 7.7 mg/dL — ABNORMAL LOW (ref 8.9–10.3)
Chloride: 106 mmol/L (ref 98–111)
Creatinine, Ser: 1.08 mg/dL (ref 0.61–1.24)
GFR, Estimated: >60 mL/min (ref >60)
Glucose, Bld: 110 mg/dL — ABNORMAL HIGH (ref 70–99)
Potassium: 3.6 mmol/L (ref 3.5–5.1)
Sodium: 137 mmol/L (ref 135–145)
Total Bilirubin: 0.6 mg/dL (ref 0.0–1.2)
Total Protein: 5.9 g/dL — ABNORMAL LOW (ref 6.5–8.1)

## 2024-10-28 LAB — BODY FLUID CULTURE W GRAM STAIN: Culture: NO GROWTH

## 2024-10-28 LAB — MAGNESIUM: Magnesium: 2.2 mg/dL (ref 1.7–2.4)

## 2024-10-28 LAB — CBC
HCT: 39.7 % (ref 39.0–52.0)
Hemoglobin: 13.4 g/dL (ref 13.0–17.0)
MCH: 33.2 pg (ref 26.0–34.0)
MCHC: 33.8 g/dL (ref 30.0–36.0)
MCV: 98.3 fL (ref 80.0–100.0)
Platelets: 325 K/uL (ref 150–400)
RBC: 4.04 MIL/uL — ABNORMAL LOW (ref 4.22–5.81)
RDW: 12.4 % (ref 11.5–15.5)
WBC: 6.3 K/uL (ref 4.0–10.5)
nRBC: 0 % (ref 0.0–0.2)

## 2024-10-28 LAB — CK: Total CK: 32 U/L — ABNORMAL LOW (ref 49–397)

## 2024-10-28 NOTE — Progress Notes (Signed)
 Progress Note   Patient: Blake Atkins FMW:[id number] DOB: April 19, 1941 DOA: 10/24/2024  DOS: the patient was seen and examined on 10/28/2024   Brief hospital course:  83 y.o. male with history of dementia complicated by expressive aphasia, hypertension, prior CVA, hyperlipidemia, chronic disease stage III, gout was brought to the ER after patient's wife found that patient was having fever  In the ER patient had a temperature of 101.2 F tachycardic with CT chest abdomen pelvis done shows features concerning for pneumonia.  Patient's right knee arthrocentesis with rare wbcs, no organisms seen.  Patient started on empiric antibiotics for possible pneumonia with concerning features for developing sepsis.     Assessment and Plan:   Concern for sepsis - Tachycardia, leukocytosis, fever, CT noting possible aspiration given concern for sepsis on presentation.  Blood cultures, empiric antibiotics, IV fluid bolus given.  Monitoring blood pressure closely.  Appears to be resolving.   Possible aspiration pneumonia - Persistent cough prior to presentation.  CT chest noting infarct treated in the RLL suspicious for aspiration or pneumonia.  Leukocytosis resolved.  Receiving empiric Zosyn, azithromycin, vancomycin.  Continue ceftriaxone.  Will DC vancomycin.   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  Right knee arthrocentesis performed.  Studies noting leukocytosis but low WBCs, no organisms.  Noted calcium  pyrophosphate crystals.  Responding well to topical Voltaren gel as well as one-time order of Toradol.  Limit NSAID use given CKD.  Showing improvement.   Metabolic encephalopathy/Alzheimer's dementia/aphasia - Multifactorial given underlying expressive aphasia, dementia, CVA, sepsis.  Appears to be showing improvement..  Talking, alert.  Will continue to monitor closely.   CKD 3 - Creatinine appears to be around baseline.  Will  recheck BMP in AM.   Acute transaminitis - Elevated liver enzymes last 2 days, trending upwards.  Labs still pending this morning.  Etiology unclear at this point.  Differentials include cholestatic, DILI, viral, autoimmune.  CT abdomen on presentation unremarkable.  Right upper quadrant ultrasound ordered this morning noting no acute blockage, no Murphy sign but limited study.  Acetaminophen  negative.  Viral hepatitis panel negative.  Patient with no leukocytosis, abdominal pain, fever.  Should LFTs increase, may warrant repeat CT abdomen.  Zosyn transition to ceftriaxone due to concern for DILI.  Will recheck LFTs in AM.  Will order hepatitis panel and acetaminophen  level.  Goals of care - Evaluated by PT/OT.  Recommending STR.  Working closely with patient, family, TOC on disposition planning.  Subjective: Patient resting comfortably this morning, wife at bedside.  Wife confirms that he is doing much better since presentation.  Patient has expressive aphasia at baseline a bit of dementia but otherwise feels well.  Able to respond short answers and communicate intermittently.  Denies any pain, shortness of breath, nausea, vomiting.  Physical Exam:  Vitals:   10/28/24 0600 10/28/24 0700 10/28/24 0743 10/28/24 1225  BP: (!) 153/90 (!) 157/105 (!) 157/105 (!) 149/95  Pulse: 65  96 68  Resp: 18   18  Temp: 97.9 F (36.6 C) 98.3 F (36.8 C) 98.3 F (36.8 C) 98 F (36.7 C)  TempSrc: Oral  Oral   SpO2: 97%  96% 93%  Weight:      Height:        GENERAL:  Alert, pleasant, disheveled HEENT:  EOMI CARDIOVASCULAR:  RRR, no murmurs appreciated RESPIRATORY: Clear to auscultation bilaterally GASTROINTESTINAL:  Soft, nontender, nondistended EXTREMITIES: Right knee no longer edematous nor erythematous NEURO: Expressive  aphasia, no new focal deficits appreciated SKIN:  No rashes noted PSYCH:  Appropriate mood and affect    Data Reviewed:  Imaging Studies: US  Abdomen Limited RUQ  (LIVER/GB) Result Date: 10/27/2024 EXAM: Right Upper Quadrant Abdominal Ultrasound 10/27/2024 09:23:55 AM CLINICAL HISTORY: Elevated liver enzymes. TECHNIQUE: Real-time ultrasonography of the right upper quadrant of the abdomen was performed. COMPARISON: None available. FINDINGS: LIVER: Visualized liver is somewhat echogenic which may be from hepatic steatosis. Poor visualization of the left hepatic lobe. No intrahepatic biliary ductal dilatation. No evidence of mass. BILIARY SYSTEM: No pericholecystic fluid. Gallbladder wall thickness measures 2.2 mm. No cholelithiasis. Negative sonographic Murphy's sign. Common bile duct measures 2.5 mm. OTHER: No right upper quadrant ascites. LIMITATIONS/ARTIFACTS: Reduced diagnostic sensitivity and specificity due to patient inability to cooperate during exams. For example, left lateral decubitus images could not be obtained. IMPRESSION: 1. Echogenic liver suggests hepatic steatosis; poor visualization of the left hepatic lobe limiting assessment. 2. Limited examination due to patient inability to cooperate, which reduces diagnostic sensitivity and specificity. Electronically signed by: Ryan Salvage MD 10/27/2024 11:06 AM EDT RP Workstation: [id number]   CT KNEE RIGHT WO CONTRAST Result Date: 10/27/2024 EXAM: CT RIGHT KNEE, WITHOUT IV CONTRAST 10/26/2024 01:30:00 PM TECHNIQUE: Axial images were acquired through the right knee without IV contrast. Reformatted images were reviewed. Automated exposure control, iterative reconstruction, and/or weight based adjustment of the mA/kV was utilized to reduce the radiation dose to as low as reasonably achievable. COMPARISON: CT Right Knee 08/14/2024. CLINICAL HISTORY: Altered mental status, septic arthritis suspected. Rule out septic knee. FINDINGS: BONES: No acute fracture or acute bony findings identified. Small jets along the tibial spine. JOINTS: No dislocation. Prominent tricompartmental osteophyte formation with severe  patellofemoral articular space narrowing, moderate to severe medial compartmental articular space narrowing, and moderate suspected chondral thinning in the lateral compartment. Extensive chondrocalcinosis including the menisci and along the joint capsule and hyaline cartilage surfaces. This could be a manifestation of CPPD arthropathy. Small free osteochondral fragments not excluded. Moderate knee effusion, similar to previous. No obvious change in the degree of synovial thickening along the suprapatellar bursa compared to 08/14/2024. No gas is present in the joint. SOFT TISSUES: The soft tissues are unremarkable. IMPRESSION: 1. Moderate knee effusion without interval change. 2. Severe tricompartmental osteoarthritis, including severe patellofemoral and moderate to severe medial compartment joint space narrowing with suspected lateral compartment chondral thinning. 3. Extensive chondrocalcinosis, which may reflect CPPD arthropathy. 4. No acute osseous abnormality. Electronically signed by: Ryan Salvage MD 10/27/2024 11:02 AM EDT RP Workstation: [id number]   CT SHOULDER RIGHT WO CONTRAST Result Date: 10/25/2024 CLINICAL DATA:  Osteomyelitis suspected, shoulder, xray done Right shoulder bruising.  Altered mental status. EXAM: CT OF THE UPPER RIGHT EXTREMITY WITHOUT CONTRAST TECHNIQUE: Multidetector CT imaging of the right shoulder was performed according to the standard protocol. RADIATION DOSE REDUCTION: This exam was performed according to the departmental dose-optimization program which includes automated exposure control, adjustment of the mA and/or kV according to patient size and/or use of iterative reconstruction technique. COMPARISON:  Radiographs and chest CTA 10/24/2024. No other comparison studies. FINDINGS: Bones/Joint/Cartilage No evidence of acute fracture, dislocation or humeral head osteonecrosis. No cortical destruction or erosive changes demonstrated to suggest osteomyelitis or septic  arthritis. There are mild glenohumeral and moderate acromioclavicular degenerative changes with multifocal chondrocalcinosis. No significant shoulder joint effusion apparent. Ligaments Suboptimally assessed by CT. Muscles and Tendons Possible mild atrophy of the subscapularis muscle. The rotator cuff otherwise appears unremarkable as evaluated by noncontrast CT. Soft  tissues No evidence of periarticular fluid collection, foreign body, soft tissue emphysema or significant inflammation. Persistent patchy airspace disease in the right lower lobe which may reflect atelectasis or an infiltrate. IMPRESSION: 1. No CT evidence of osteomyelitis or septic arthritis. 2. Mild glenohumeral and moderate acromioclavicular degenerative changes with multifocal chondrocalcinosis. 3. Possible mild atrophy of the subscapularis muscle. 4. Persistent patchy airspace disease in the right lower lobe which may reflect atelectasis or an infiltrate. Electronically Signed   By: Elsie Perone M.D.   On: 10/25/2024 08:57   MR CERVICAL SPINE W WO CONTRAST Result Date: 10/25/2024 EXAM: MRI CERVICAL SPINE WITHOUT AND WITH CONTRAST 10/25/2024 02:51:36 AM TECHNIQUE: Multiplanar multisequence MRI of the cervical spine was performed with and without intravenous contrast. 9mL gadobutrol (GADAVIST) 1 MMOL/ML injection. COMPARISON: CT from 10/20/2024. CLINICAL HISTORY: Right upper extremity weakness. FINDINGS: LIMITATIONS/ARTIFACTS: Examination moderately degraded by motion artifact. BONES AND ALIGNMENT: Straightening of the normal cervical lordosis. Normal vertebral body heights. Bone marrow signal is unremarkable. SPINAL CORD: Normal spinal cord size. No abnormal spinal cord signal. SOFT TISSUES: Mild edema within the posterior paraspinous musculature at the level of C3-C4, nonspecific, but could reflect changes of muscular injury and or strain. No paraspinal mass. C2-C3: Left sided uncovertebral spurring without significant disc bulge. Mild left  sided facet hypertrophy. No spinal stenosis. Mild left C3 foraminal narrowing. C3-C4: Diffuse disc bulge with bilateral uncovertebral spurring. Mild left greater than right facet hypertrophy. Mild spinal stenosis. Severe left with moderate right C4 foraminal narrowing. C4-C5: Left paracentral disc protrusion and density ventral thecal sac. Mild cord flattening but no visible cord signal changes. Superimposed bilateral facet hypertrophy. Resultant in mild to moderate spinal stenosis. Severe left C5 foraminal narrowing. Right neural foramen appears patent. C5-C6: Left paracentral disc protrusion flattens the ventral thecal sac. Mild cord flattening without visible cord signal changes. Right greater than left facet hypertrophy. Moderate spinal stenosis with moderate left C6 foraminal narrowing. Right neural foramen appears patent. C6-C7: Degenerative vertebral disc space narrowing. A broad posterior disc osteophyte mildly flattens the ventral thecal sac. Mild spinal stenosis. Mild right greater than left C7 foraminal narrowing. C7-T1: Negative interspace. Mild bilateral facet hypertrophy. No spinal stenosis. Mild left C8 foraminal narrowing. IMPRESSION: 1. Mild edema within the posterior paraspinous musculature at C3-C4, which may reflect muscular injury or strain. 2. No other acute.  abnormality within the cervical spine. 3. Multilevel cervical spondylosis with resultant mild to moderate diffuse spinal stenosis at C3-C4 through C6 c7. Associated severe left with moderate right C4 foraminal stenosis, severe left C5 foraminal narrowing, with moderate left C6 foraminal stenosis. Electronically signed by: Morene Hoard MD 10/25/2024 03:23 AM EDT RP Workstation: [id number]   MR BRAIN WO CONTRAST Result Date: 10/25/2024 EXAM: MRI BRAIN WITHOUT CONTRAST 10/25/2024 02:51:14 AM TECHNIQUE: Multiplanar multisequence MRI of the head/brain was performed without the administration of intravenous contrast. Examination  degraded by motion. COMPARISON: Comparison with prior CT from 10/24/2024. CLINICAL HISTORY: Neuro deficit, acute, stroke suspected. FINDINGS: BRAIN AND VENTRICLES: No acute infarct. No intracranial hemorrhage. No mass. No midline shift. Generalized age-related atrophy. Patchy T2/FLAIR hyperintensity involving the periventricular and deep white matter, consistent with chronic cerebral ischemic disease, moderate in nature. Mild ventricular prominence related to global parenchymal volume loss without hydrocephalus. The sella is unremarkable. Normal flow voids. ORBITS: Prior bilateral ocular lens replacement. SINUSES AND MASTOIDS: Trace bilateral mastoid effusions noted, of indeterminate significance. BONES AND SOFT TISSUES: Normal marrow signal. No acute soft tissue abnormality. IMPRESSION: 1. No acute intracranial abnormality. 2. Age-related  cerebral atrophy with moderate chronic microvascular ischemic disease. Electronically signed by: Morene Hoard MD 10/25/2024 03:12 AM EDT RP Workstation: [id number]   CT Angio Chest PE W and/or Wo Contrast Result Date: 10/24/2024 EXAM: CTA CHEST PE WITHOUT AND WITH CONTRAST CT ABDOMEN AND PELVIS WITHOUT AND WITH CONTRAST 10/24/2024 08:56:53 PM TECHNIQUE: CTA of the chest was performed after the administration of 75 mL of iohexol (OMNIPAQUE) 350 MG/ML injection. Multiplanar reformatted images are provided for review. MIP images are provided for review. CT of the abdomen and pelvis was performed with the administration of intravenous contrast. Automated exposure control, iterative reconstruction, and/or weight based adjustment of the mA/kV was utilized to reduce the radiation dose to as low as reasonably achievable. COMPARISON: Comparison is made to 07/05/2023. CLINICAL HISTORY: Pulmonary embolism (PE) suspected, high prob; fever, pneumonia. FINDINGS: CHEST: PULMONARY ARTERIES: Pulmonary arteries are adequately opacified for evaluation. No intraluminal filling defect to  suggest pulmonary embolism. Central pulmonary arteries are of normal caliber. Main pulmonary artery is normal in caliber. MEDIASTINUM: No mediastinal lymphadenopathy. The heart demonstrates mild coronary artery calcification and global cardiac size is mildly enlarged. No pericardial effusion. Fusiform aneurysm of the ascending aorta measuring 4.3 cm in maximal diameter. Descending thoracic aorta is of normal caliber. Mild atherosclerotic calcification within the thoracic aorta. LUNGS AND PLEURA: Bibasilar atelectasis. Superimposed infiltrate within the posterior basal right lower lobe, possibly infectious in the acute setting. No pneumothorax or pleural effusion. SOFT TISSUES AND BONES: No acute bone or soft tissue abnormality. ABDOMEN AND PELVIS: LIVER: The liver is unremarkable. GALLBLADDER AND BILE DUCTS: Gallbladder is unremarkable. No biliary ductal dilatation. SPLEEN: Spleen demonstrates no acute abnormality. PANCREAS: Pancreas demonstrates no acute abnormality. ADRENAL GLANDS: Adrenal glands demonstrate no acute abnormality. KIDNEYS, URETERS AND BLADDER: Stable simple cortical cysts within the right kidney. The kidneys are otherwise unremarkable. No stones in the kidneys or ureters. No hydronephrosis. No perinephric or periureteral stranding. Urinary bladder is unremarkable. GI AND BOWEL: Appendix absent. The stomach, small bowel, and large bowel are otherwise unremarkable. There is no bowel obstruction. No abnormal bowel wall thickening or distension. REPRODUCTIVE: Reproductive organs are unremarkable. PERITONEUM AND RETROPERITONEUM: No ascites or free air. LYMPH NODES: No lymphadenopathy. BONES AND SOFT TISSUES: Osseous structures are age appropriate. No acute bone abnormality. No lytic or blastic bone lesion. Mild aortoiliac atherosclerotic calcification. No aortic aneurysm. No focal soft tissue abnormality. RAF SCORE: Aortic atherosclerosis (icd10-i70.0), aortic aneurysm (icd10-i71.9) IMPRESSION: 1. No  pulmonary embolism. 2. Superimposed infiltrate within the posterior basal right lower lobe, possibly infectious in the acute setting. 3. Fusiform aneurysm of the ascending thoracic aorta measuring 4.3 cm in maximal diameter. Recommend annual imaging follow-up by CTA or mra. This recommendation follows 2010 accf/aha/aats/ACR/asa/sca/scai/sir/sts/svm guidelines for the diagnosis and management of patients with thoracic aortic disease. Circulation. 2010; 121: Z733-z630. Aortic aneurysm nos (icd10-i71.9) 4. raf score: Aortic atherosclerosis (icd10-i70.0); aortic aneurysm (pri89-p28.9) Electronically signed by: Dorethia Molt MD 10/24/2024 09:31 PM EDT RP Workstation: [id number]   CT ABDOMEN PELVIS W CONTRAST Result Date: 10/24/2024 EXAM: CTA CHEST PE WITHOUT AND WITH CONTRAST CT ABDOMEN AND PELVIS WITHOUT AND WITH CONTRAST 10/24/2024 08:56:53 PM TECHNIQUE: CTA of the chest was performed after the administration of 75 mL of iohexol (OMNIPAQUE) 350 MG/ML injection. Multiplanar reformatted images are provided for review. MIP images are provided for review. CT of the abdomen and pelvis was performed with the administration of intravenous contrast. Automated exposure control, iterative reconstruction, and/or weight based adjustment of the mA/kV was utilized to reduce the radiation dose to  as low as reasonably achievable. COMPARISON: Comparison is made to 07/05/2023. CLINICAL HISTORY: Pulmonary embolism (PE) suspected, high prob; fever, pneumonia. FINDINGS: CHEST: PULMONARY ARTERIES: Pulmonary arteries are adequately opacified for evaluation. No intraluminal filling defect to suggest pulmonary embolism. Central pulmonary arteries are of normal caliber. Main pulmonary artery is normal in caliber. MEDIASTINUM: No mediastinal lymphadenopathy. The heart demonstrates mild coronary artery calcification and global cardiac size is mildly enlarged. No pericardial effusion. Fusiform aneurysm of the ascending aorta measuring 4.3 cm  in maximal diameter. Descending thoracic aorta is of normal caliber. Mild atherosclerotic calcification within the thoracic aorta. LUNGS AND PLEURA: Bibasilar atelectasis. Superimposed infiltrate within the posterior basal right lower lobe, possibly infectious in the acute setting. No pneumothorax or pleural effusion. SOFT TISSUES AND BONES: No acute bone or soft tissue abnormality. ABDOMEN AND PELVIS: LIVER: The liver is unremarkable. GALLBLADDER AND BILE DUCTS: Gallbladder is unremarkable. No biliary ductal dilatation. SPLEEN: Spleen demonstrates no acute abnormality. PANCREAS: Pancreas demonstrates no acute abnormality. ADRENAL GLANDS: Adrenal glands demonstrate no acute abnormality. KIDNEYS, URETERS AND BLADDER: Stable simple cortical cysts within the right kidney. The kidneys are otherwise unremarkable. No stones in the kidneys or ureters. No hydronephrosis. No perinephric or periureteral stranding. Urinary bladder is unremarkable. GI AND BOWEL: Appendix absent. The stomach, small bowel, and large bowel are otherwise unremarkable. There is no bowel obstruction. No abnormal bowel wall thickening or distension. REPRODUCTIVE: Reproductive organs are unremarkable. PERITONEUM AND RETROPERITONEUM: No ascites or free air. LYMPH NODES: No lymphadenopathy. BONES AND SOFT TISSUES: Osseous structures are age appropriate. No acute bone abnormality. No lytic or blastic bone lesion. Mild aortoiliac atherosclerotic calcification. No aortic aneurysm. No focal soft tissue abnormality. RAF SCORE: Aortic atherosclerosis (icd10-i70.0), aortic aneurysm (icd10-i71.9) IMPRESSION: 1. No pulmonary embolism. 2. Superimposed infiltrate within the posterior basal right lower lobe, possibly infectious in the acute setting. 3. Fusiform aneurysm of the ascending thoracic aorta measuring 4.3 cm in maximal diameter. Recommend annual imaging follow-up by CTA or mra. This recommendation follows 2010 accf/aha/aats/ACR/asa/sca/scai/sir/sts/svm  guidelines for the diagnosis and management of patients with thoracic aortic disease. Circulation. 2010; 121: [id number]. Aortic aneurysm nos (icd10-i71.9) 4. raf score: Aortic atherosclerosis (icd10-i70.0); aortic aneurysm (pri89-p28.9) Electronically signed by: Dorethia Molt MD 10/24/2024 09:31 PM EDT RP Workstation: [id number]   CT Head Wo Contrast Result Date: 10/24/2024 CLINICAL DATA:  Mental status change EXAM: CT HEAD WITHOUT CONTRAST TECHNIQUE: Contiguous axial images were obtained from the base of the skull through the vertex without intravenous contrast. RADIATION DOSE REDUCTION: This exam was performed according to the departmental dose-optimization program which includes automated exposure control, adjustment of the mA and/or kV according to patient size and/or use of iterative reconstruction technique. COMPARISON:  CT brain 10/20/2024 FINDINGS: Brain: No acute territorial infarction, hemorrhage or intracranial mass. Atrophy and chronic small vessel ischemic changes of the white matter. Stable ventricle size. Vascular: No hyperdense vessels.  No unexpected calcification Skull: Normal. Negative for fracture or focal lesion. Sinuses/Orbits: Mild mucosal thickening in the sinuses Other: Small none IMPRESSION: 1. No CT evidence for acute intracranial abnormality. 2. Atrophy and chronic small vessel ischemic changes of the white matter. Electronically Signed   By: Luke Bun M.D.   On: 10/24/2024 17:33   DG Shoulder Right Portable Result Date: 10/24/2024 CLINICAL DATA:  Altered mental status with new onset incontinence. EXAM: RIGHT SHOULDER - 1 VIEW COMPARISON:  None Available. FINDINGS: Mild-to-moderate degenerative change of the [facility] joint. Minimal degenerate change of the glenohumeral joint. No evidence of acute fracture or dislocation. Remainder  of the exam is unremarkable. IMPRESSION: 1. No acute findings. 2. Degenerative changes as described. Electronically Signed   By: Toribio Agreste M.D.   On:  10/24/2024 17:23   DG Chest Port 1 View Result Date: 10/24/2024 CLINICAL DATA:  Sepsis.  Altered mental status. EXAM: PORTABLE CHEST 1 VIEW COMPARISON:  10/20/2024 FINDINGS: Lungs are hypoinflated and otherwise clear. Cardiomediastinal silhouette and remainder of the exam is unchanged. IMPRESSION: Hypoinflation without acute cardiopulmonary disease. Electronically Signed   By: Toribio Agreste M.D.   On: 10/24/2024 17:22   CT Cervical Spine Wo Contrast Result Date: 10/20/2024 EXAM: CT CERVICAL SPINE WITHOUT CONTRAST 10/20/2024 08:10:38 AM TECHNIQUE: CT of the cervical spine was performed without the administration of intravenous contrast. Multiplanar reformatted images are provided for review. Automated exposure control, iterative reconstruction, and/or weight based adjustment of the mA/kV was utilized to reduce the radiation dose to as low as reasonably achievable. COMPARISON: CT face and CT head today reported separately. CLINICAL HISTORY: 83 year old male. Neck trauma, multiple falls this week, bruised eye, AMS at baseline, no complaints. FINDINGS: CERVICAL SPINE: BONES AND ALIGNMENT: Maintained cervical lordosis. Normal bone mineralization for age. No acute fracture or traumatic malalignment. DEGENERATIVE CHANGES: Widespread degenerative cervical disc calcification. Calcified degenerative ligamentous hypertrophy about the odontoid. CT suggests mild degenerative cervical spinal stenosis. SOFT TISSUES: No prevertebral soft tissue swelling. Bulky left ICA calcified atherosclerosis. Negative visible noncontrast thoracic inlet. IMPRESSION: 1. No acute traumatic injury identified in the cervical spine. 2. Cervical disc and ligamentous degeneration. 3. left ICA atherosclerosis. Electronically signed by: Helayne Hurst MD 10/20/2024 08:25 AM EDT RP Workstation: [id number]   CT Maxillofacial Wo Contrast Result Date: 10/20/2024 EXAM: CT OF THE FACE WITHOUT CONTRAST 10/20/2024 08:10:38 AM TECHNIQUE: CT of the face  was performed without the administration of intravenous contrast. Multiplanar reformatted images are provided for review. Automated exposure control, iterative reconstruction, and/or weight based adjustment of the mA/kV was utilized to reduce the radiation dose to as low as reasonably achievable. COMPARISON: Head CT reported separately today. CLINICAL HISTORY: 83 year old male. Facial trauma, blunt; R periorbital trauma. Patient BIB EMS from home, where he lives with his daughter, after multiple falls this week. Patient had one fall where he has a bruised eye from. FINDINGS: FACIAL BONES: No acute facial fracture. No mandibular dislocation. No suspicious bone lesion. Underlying facial bones intact. ORBITS: Globes are intact. Postoperative changes to both globes. No acute traumatic injury. No inflammatory change. SINUSES AND MASTOIDS: Paranasal sinuses, middle ears and mastoids well aerated. SOFT TISSUES: Superficial right infraorbital, premalar soft tissue swelling and stranding on series 3 image 25. No soft tissue gas identified. VASCULATURE: Mild for age right carotid, moderate to severe left ICA calcified atherosclerosis in the visible neck. CERVICAL SPINE: Partially visible cervical spine degeneration. IMPRESSION: 1. Superficial right infraorbital soft tissue injury. 2. No facial bone fracture. 3. 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 Electronically signed by: Helayne Hurst MD 10/20/2024 08:23 AM EDT RP Workstation: [id number]   CT Head Wo Contrast Result Date: 10/20/2024 EXAM: CT HEAD WITHOUT CONTRAST 10/20/2024 08:10:38 AM TECHNIQUE: CT of the head was performed without the administration of intravenous contrast. Automated exposure control, iterative reconstruction, and/or weight based adjustment of the mA/kV was utilized to reduce the radiation dose to as low as reasonably achievable. COMPARISON: CT Head, 08/13/2024. CLINICAL HISTORY: 83 Year Old Male, Head Trauma.  FINDINGS: BRAIN AND VENTRICLES: Stable brain volume. Stable, patchy, and confluent cerebral white matter hypodensity, most pronounced  in the left superior frontal gyrus posteriorly, perirolandic subcortical white matter. No convincing cortical encephalomalacia. No acute hemorrhage. No evidence of acute infarct. No hydrocephalus. No extra-axial collection. No mass effect or midline shift. ORBITS: No acute abnormality. SINUSES: No acute abnormality. SOFT TISSUES AND SKULL: No acute soft tissue abnormality. No skull fracture. IMPRESSION: 1. No acute traumatic injury. 2. Stable white matter disease. Electronically signed by: Helayne Hurst MD 10/20/2024 08:19 AM EDT RP Workstation: [id number]   DG Pelvis Portable Result Date: 10/20/2024 EXAM: 1 or 2 VIEW(S) XRAY OF THE PELVIS 10/20/2024 07:53:00 AM COMPARISON: CT abdomen and pelvis 07/05/2023. CLINICAL HISTORY: 83 year old male with multiple falls this week, bruised eye, and AMS at baseline. FINDINGS: BONES AND JOINTS: No acute fracture. No focal osseous lesion. No joint dislocation. SOFT TISSUES: The soft tissues are unremarkable. ABDOMINAL CONTENTS: Negative visible bowel gas pattern. IMPRESSION: 1. No acute fracture or dislocation identified about the pelvis. Electronically signed by: Helayne Hurst MD 10/20/2024 08:03 AM EDT RP Workstation: [id number]   DG Chest Portable 1 View Result Date: 10/20/2024 EXAM: 1 VIEW XRAY OF THE CHEST 10/20/2024 07:53:00 AM COMPARISON: Portable chest x ray 08/13/2024. CLINICAL HISTORY: 83 year old male. Multiple falls this week, including one causing a bruised eye. Patient is not on thinners. AMS at baseline. No complaints. FINDINGS: LUNGS AND PLEURA: Lower lung volumes. No focal pulmonary opacity. No pulmonary edema. No pleural effusion. No pneumothorax. HEART AND MEDIASTINUM: No acute abnormality of the cardiac and mediastinal silhouettes. BONES AND SOFT TISSUES: No acute osseous abnormality. IMPRESSION: 1. lower lung volumes.  No  acute cardiopulmonary abnormality. Electronically signed by: Helayne Hurst MD 10/20/2024 08:02 AM EDT RP Workstation: [id number]    There are no new results to review at this time.  Previous records (including but not limited to H&P, progress notes, nursing notes, TOC management) were reviewed in assessment of this patient.  Labs: CBC: Recent Labs  Lab 10/24/24 1652 10/24/24 1700 10/25/24 1414 10/26/24 1209 10/27/24 0548 10/28/24 0524  WBC 13.9*  --  11.8* 10.1 7.9 6.3  NEUTROABS 10.9*  --   --   --   --   --   HGB 16.6 16.7 15.2 14.6 14.1 13.4  HCT 47.7 49.0 44.2 43.1 41.5 39.7  MCV 98.4  --  98.2 98.6 97.6 98.3  PLT 278  --  260 317 320 325   Basic Metabolic Panel: Recent Labs  Lab 10/24/24 1652 10/24/24 1700 10/25/24 1809 10/26/24 1209 10/27/24 0548 10/28/24 0524  NA 134* 133* 137 135 136  --   K 4.3 4.1 4.0 3.7 3.6  --   CL 100 104 105 106 105  --   CO2 22  --  22 17* 20*  --   GLUCOSE 123* 120* 105* 117* 109*  --   BUN 28* 32* 22 25* 24*  --   CREATININE 1.31* 1.30* 1.31* 1.26* 1.26*  --   CALCIUM  9.0  --  8.4* 8.3* 8.4*  --   MG  --   --   --   --   --  2.2   Liver Function Tests: Recent Labs  Lab 10/24/24 1652 10/25/24 1809 10/26/24 1209 10/27/24 0548  AST 28 39 144* 266*  ALT 26 36 93* 187*  ALKPHOS 69 67 91 129*  BILITOT 1.7* 1.0 1.3* 1.4*  PROT 6.5 6.1* 6.2* 6.2*  ALBUMIN 2.7* 2.4* 2.4* 2.4*   CBG: Recent Labs  Lab 10/25/24 1158 10/26/24 0632  GLUCAP 125* 110*    Scheduled Meds:  diclofenac Sodium  2 g Topical QID   donepezil   10 mg Oral QHS   feeding supplement  237 mL Oral BID BM   heparin   5,000 Units Subcutaneous Q8H   Continuous Infusions:  cefTRIAXone (ROCEPHIN)  IV 2 g (10/27/24 1521)   vancomycin 1,250 mg (10/28/24 1224)   PRN Meds:.artificial tears, hydrALAZINE  Family Communication: Wife at bedside  Disposition: Status is: Inpatient Remains inpatient appropriate because: Transaminitis, disposition,  pneumonia     Time spent: 38 minutes  Length of inpatient stay: 4 days  Author: Carliss LELON Canales, DO 10/28/2024 1:56 PM  For on call review [email or web address].

## 2024-10-28 NOTE — Hospital Course (Addendum)
 83 y.o. male with history of dementia complicated by expressive aphasia, hypertension, prior CVA, hyperlipidemia, chronic disease stage III, gout was brought to the ER after patient's wife found that patient was having fever  In the ER patient had a temperature of 101.2 F tachycardic with CT chest abdomen pelvis done shows features concerning for pneumonia.  Patient's right knee arthrocentesis with rare wbcs, no organisms seen.  Patient started on empiric antibiotics for possible pneumonia with concerning features for developing sepsis.     Assessment and Plan:   Concern for sepsis - Tachycardia, leukocytosis, fever, CT noting possible aspiration given concern for sepsis on presentation.  Blood cultures, empiric antibiotics, IV fluid bolus given.  Monitoring blood pressure closely.  Appears to be resolving.   Possible aspiration pneumonia - Persistent cough prior to presentation.  CT chest noting infarct treated in the RLL suspicious for aspiration or pneumonia.  Leukocytosis resolved.  Receiving empiric Zosyn, azithromycin, vancomycin.  Continue ceftriaxone.  Will DC vancomycin.   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  Right knee arthrocentesis performed.  Studies noting leukocytosis but low WBCs, no organisms.  Noted calcium  pyrophosphate crystals.  Responding well to topical Voltaren gel as well as one-time order of Toradol.  Limit NSAID use given CKD.  Showing improvement.   Metabolic encephalopathy/Alzheimer's dementia/aphasia - Multifactorial given underlying expressive aphasia, dementia, CVA, sepsis.  Appears to be showing improvement..  Talking, alert.  Will continue to monitor closely.   CKD 3 - Creatinine appears to be around baseline.  Will recheck BMP in AM.   Acute transaminitis - Elevated liver enzymes last 2 days, trending upwards.  Labs still pending this morning.  Etiology unclear at this point.   Differentials include cholestatic, DILI, viral, autoimmune.  CT abdomen on presentation unremarkable.  Right upper quadrant ultrasound ordered this morning noting no acute blockage, no Murphy sign but limited study.  Acetaminophen  negative.  Viral hepatitis panel negative.  Patient with no leukocytosis, abdominal pain, fever.  Should LFTs increase, may warrant repeat CT abdomen.  Zosyn transition to ceftriaxone due to concern for DILI.  Will recheck LFTs in AM.  Will order hepatitis panel and acetaminophen  level.   Goals of care - Evaluated by PT/OT.  Recommending STR.  Working closely with patient, family, TOC on disposition planning.

## 2024-10-28 NOTE — Plan of Care (Signed)
  Problem: Activity: Goal: Risk for activity intolerance will decrease Outcome: Progressing   Problem: Coping: Goal: Level of anxiety will decrease Outcome: Progressing   Problem: Elimination: Goal: Will not experience complications related to bowel motility Outcome: Progressing Goal: Will not experience complications related to urinary retention Outcome: Progressing   Problem: Safety: Goal: Ability to remain free from injury will improve Outcome: Progressing   

## 2024-10-29 DIAGNOSIS — L8990 Pressure ulcer of unspecified site, unspecified stage: Secondary | ICD-10-CM | POA: Insufficient documentation

## 2024-10-29 DIAGNOSIS — K719 Toxic liver disease, unspecified: Secondary | ICD-10-CM

## 2024-10-29 DIAGNOSIS — N1832 Chronic kidney disease, stage 3b: Secondary | ICD-10-CM | POA: Diagnosis not present

## 2024-10-29 DIAGNOSIS — A419 Sepsis, unspecified organism: Secondary | ICD-10-CM | POA: Diagnosis not present

## 2024-10-29 DIAGNOSIS — G9341 Metabolic encephalopathy: Secondary | ICD-10-CM | POA: Diagnosis not present

## 2024-10-29 DIAGNOSIS — M11261 Other chondrocalcinosis, right knee: Secondary | ICD-10-CM

## 2024-10-29 DIAGNOSIS — J189 Pneumonia, unspecified organism: Secondary | ICD-10-CM | POA: Diagnosis not present

## 2024-10-29 DIAGNOSIS — I10 Essential (primary) hypertension: Secondary | ICD-10-CM

## 2024-10-29 DIAGNOSIS — Z8659 Personal history of other mental and behavioral disorders: Secondary | ICD-10-CM

## 2024-10-29 LAB — CULTURE, BLOOD (ROUTINE X 2)
Culture: NO GROWTH
Culture: NO GROWTH
Special Requests: ADEQUATE

## 2024-10-29 LAB — COMPREHENSIVE METABOLIC PANEL WITH GFR
ALT: 213 U/L — ABNORMAL HIGH (ref 0–44)
AST: 194 U/L — ABNORMAL HIGH (ref 15–41)
Albumin: 2.3 g/dL — ABNORMAL LOW (ref 3.5–5.0)
Alkaline Phosphatase: 165 U/L — ABNORMAL HIGH (ref 38–126)
Anion gap: 10 (ref 5–15)
BUN: 16 mg/dL (ref 8–23)
CO2: 18 mmol/L — ABNORMAL LOW (ref 22–32)
Calcium: 8.2 mg/dL — ABNORMAL LOW (ref 8.9–10.3)
Chloride: 107 mmol/L (ref 98–111)
Creatinine, Ser: 1.05 mg/dL (ref 0.61–1.24)
GFR, Estimated: >60 mL/min (ref >60)
Glucose, Bld: 109 mg/dL — ABNORMAL HIGH (ref 70–99)
Potassium: 3.6 mmol/L (ref 3.5–5.1)
Sodium: 135 mmol/L (ref 135–145)
Total Bilirubin: 1.1 mg/dL (ref 0.0–1.2)
Total Protein: 6 g/dL — ABNORMAL LOW (ref 6.5–8.1)

## 2024-10-29 LAB — CBC
HCT: 41.1 % (ref 39.0–52.0)
Hemoglobin: 14.2 g/dL (ref 13.0–17.0)
MCH: 33.4 pg (ref 26.0–34.0)
MCHC: 34.5 g/dL (ref 30.0–36.0)
MCV: 96.7 fL (ref 80.0–100.0)
Platelets: 405 K/uL — ABNORMAL HIGH (ref 150–400)
RBC: 4.25 MIL/uL (ref 4.22–5.81)
RDW: 12.3 % (ref 11.5–15.5)
WBC: 8.3 K/uL (ref 4.0–10.5)
nRBC: 0 % (ref 0.0–0.2)

## 2024-10-29 MED ORDER — HALOPERIDOL LACTATE 5 MG/ML IJ SOLN
2.0000 mg | Freq: Four times a day (QID) | INTRAMUSCULAR | Status: DC | PRN
  Filled 2024-10-29: qty 1

## 2024-10-29 NOTE — Plan of Care (Signed)

## 2024-10-29 NOTE — Progress Notes (Signed)
 Progress Note   Patient: Blake Atkins FMW:[id number] DOB: 1941-05-10 DOA: 10/24/2024  DOS: the patient was seen and examined on 10/29/2024   Brief hospital course:  83 y.o. male with history of dementia complicated by expressive aphasia, hypertension, prior CVA, hyperlipidemia, chronic disease stage III, gout was brought to the ER after patient's wife found that patient was having fever  In the ER patient had a temperature of 101.2 F tachycardic with CT chest abdomen pelvis done shows features concerning for pneumonia.  Patient's right knee arthrocentesis with rare wbcs, no organisms seen.  Patient started on empiric antibiotics for possible pneumonia with concerning features for developing sepsis.     Assessment and Plan:   Concern for sepsis - Tachycardia, leukocytosis, fever, CT noting possible aspiration given concern for sepsis on presentation.  Blood cultures, empiric antibiotics, IV fluid bolus given.  Monitoring blood pressure closely.  Appears to be resolved.   Possible aspiration pneumonia - Persistent cough prior to presentation.  CT chest noting infarct treated in the RLL suspicious for aspiration or pneumonia.  Leukocytosis resolved.  Initially received empiric Zosyn, azithromycin, vancomycin.  Completed azithromycin, vancomycin discontinued.  Continue ceftriaxone (day 6 of 7).    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  Right knee arthrocentesis performed.  Studies noting leukocytosis but low WBCs, no organisms.  Noted calcium  pyrophosphate crystals.  Responding well to topical Voltaren gel as well as one-time order of Toradol.  Limit NSAID use given CKD.  Showing improvement.   Metabolic encephalopathy/Alzheimer's dementia/aphasia - Multifactorial given underlying expressive aphasia, dementia, CVA, sepsis.  Appears to be showing improvement..  Talking, alert.  Will continue to monitor closely.   CKD  3 - Creatinine appears to be around baseline.  Will recheck BMP in AM.   Acute transaminitis - Elevated liver enzymes 10/31 with initial upward trend.  Normal T. bili but elevated AST/ALT//alk phos suggesting direct liver injury.  CT abdomen on presentation unremarkable.  Right upper quadrant ultrasound noting no acute blockage, no Murphy sign but limited study.  Acetaminophen  negative.  Viral hepatitis panel negative.  Patient with no leukocytosis, abdominal pain, fever.  Given pattern, likely drug-induced liver injury (DILI).  Zosyn transition to ceftriaxone due to concern for DILI.  Appears to show small improvement this morning.     Goals of care - Evaluated by PT/OT.  Recommending STR.  Working closely with patient, family, TOC on disposition planning.   Subjective: Patient resting comfortably this morning.  Apparently slept well overnight however had some agitation.  Per wife the patient is very active when he sleeps, moving his legs and arms.  Otherwise this morning appears calm, comfortable.  Physical Exam:  Vitals:   10/28/24 1500 10/28/24 1948 10/29/24 0010 10/29/24 0842  BP: (!) 149/116 (!) 160/111 (!) 155/103 (!) 166/96  Pulse: 72 81 72 66  Resp: 18     Temp: 98.7 F (37.1 C) 98.5 F (36.9 C) 98.5 F (36.9 C) 98.1 F (36.7 C)  TempSrc: Axillary   Axillary  SpO2: 97% 97% 96% 100%  Weight:      Height:        GENERAL:  Alert, pleasant, disheveled HEENT:  EOMI CARDIOVASCULAR:  RRR, no murmurs appreciated RESPIRATORY: Clear to auscultation bilaterally GASTROINTESTINAL:  Soft, nontender, nondistended EXTREMITIES: Right knee no longer edematous nor erythematous NEURO: Expressive aphasia, no new focal deficits appreciated SKIN:  No rashes noted PSYCH:  Appropriate mood and affect    Data  Reviewed:  Imaging Studies: US  Abdomen Limited RUQ (LIVER/GB) Result Date: 10/27/2024 EXAM: Right Upper Quadrant Abdominal Ultrasound 10/27/2024 09:23:55 AM CLINICAL HISTORY:  Elevated liver enzymes. TECHNIQUE: Real-time ultrasonography of the right upper quadrant of the abdomen was performed. COMPARISON: None available. FINDINGS: LIVER: Visualized liver is somewhat echogenic which may be from hepatic steatosis. Poor visualization of the left hepatic lobe. No intrahepatic biliary ductal dilatation. No evidence of mass. BILIARY SYSTEM: No pericholecystic fluid. Gallbladder wall thickness measures 2.2 mm. No cholelithiasis. Negative sonographic Murphy's sign. Common bile duct measures 2.5 mm. OTHER: No right upper quadrant ascites. LIMITATIONS/ARTIFACTS: Reduced diagnostic sensitivity and specificity due to patient inability to cooperate during exams. For example, left lateral decubitus images could not be obtained. IMPRESSION: 1. Echogenic liver suggests hepatic steatosis; poor visualization of the left hepatic lobe limiting assessment. 2. Limited examination due to patient inability to cooperate, which reduces diagnostic sensitivity and specificity. Electronically signed by: Ryan Salvage MD 10/27/2024 11:06 AM EDT RP Workstation: [id number]   CT KNEE RIGHT WO CONTRAST Result Date: 10/27/2024 EXAM: CT RIGHT KNEE, WITHOUT IV CONTRAST 10/26/2024 01:30:00 PM TECHNIQUE: Axial images were acquired through the right knee without IV contrast. Reformatted images were reviewed. Automated exposure control, iterative reconstruction, and/or weight based adjustment of the mA/kV was utilized to reduce the radiation dose to as low as reasonably achievable. COMPARISON: CT Right Knee 08/14/2024. CLINICAL HISTORY: Altered mental status, septic arthritis suspected. Rule out septic knee. FINDINGS: BONES: No acute fracture or acute bony findings identified. Small jets along the tibial spine. JOINTS: No dislocation. Prominent tricompartmental osteophyte formation with severe patellofemoral articular space narrowing, moderate to severe medial compartmental articular space narrowing, and moderate  suspected chondral thinning in the lateral compartment. Extensive chondrocalcinosis including the menisci and along the joint capsule and hyaline cartilage surfaces. This could be a manifestation of CPPD arthropathy. Small free osteochondral fragments not excluded. Moderate knee effusion, similar to previous. No obvious change in the degree of synovial thickening along the suprapatellar bursa compared to 08/14/2024. No gas is present in the joint. SOFT TISSUES: The soft tissues are unremarkable. IMPRESSION: 1. Moderate knee effusion without interval change. 2. Severe tricompartmental osteoarthritis, including severe patellofemoral and moderate to severe medial compartment joint space narrowing with suspected lateral compartment chondral thinning. 3. Extensive chondrocalcinosis, which may reflect CPPD arthropathy. 4. No acute osseous abnormality. Electronically signed by: Ryan Salvage MD 10/27/2024 11:02 AM EDT RP Workstation: [id number]   CT SHOULDER RIGHT WO CONTRAST Result Date: 10/25/2024 CLINICAL DATA:  Osteomyelitis suspected, shoulder, xray done Right shoulder bruising.  Altered mental status. EXAM: CT OF THE UPPER RIGHT EXTREMITY WITHOUT CONTRAST TECHNIQUE: Multidetector CT imaging of the right shoulder was performed according to the standard protocol. RADIATION DOSE REDUCTION: This exam was performed according to the departmental dose-optimization program which includes automated exposure control, adjustment of the mA and/or kV according to patient size and/or use of iterative reconstruction technique. COMPARISON:  Radiographs and chest CTA 10/24/2024. No other comparison studies. FINDINGS: Bones/Joint/Cartilage No evidence of acute fracture, dislocation or humeral head osteonecrosis. No cortical destruction or erosive changes demonstrated to suggest osteomyelitis or septic arthritis. There are mild glenohumeral and moderate acromioclavicular degenerative changes with multifocal chondrocalcinosis.  No significant shoulder joint effusion apparent. Ligaments Suboptimally assessed by CT. Muscles and Tendons Possible mild atrophy of the subscapularis muscle. The rotator cuff otherwise appears unremarkable as evaluated by noncontrast CT. Soft tissues No evidence of periarticular fluid collection, foreign body, soft tissue emphysema or significant inflammation. Persistent patchy airspace disease in the  right lower lobe which may reflect atelectasis or an infiltrate. IMPRESSION: 1. No CT evidence of osteomyelitis or septic arthritis. 2. Mild glenohumeral and moderate acromioclavicular degenerative changes with multifocal chondrocalcinosis. 3. Possible mild atrophy of the subscapularis muscle. 4. Persistent patchy airspace disease in the right lower lobe which may reflect atelectasis or an infiltrate. Electronically Signed   By: Elsie Perone M.D.   On: 10/25/2024 08:57   MR CERVICAL SPINE W WO CONTRAST Result Date: 10/25/2024 EXAM: MRI CERVICAL SPINE WITHOUT AND WITH CONTRAST 10/25/2024 02:51:36 AM TECHNIQUE: Multiplanar multisequence MRI of the cervical spine was performed with and without intravenous contrast. 9mL gadobutrol (GADAVIST) 1 MMOL/ML injection. COMPARISON: CT from 10/20/2024. CLINICAL HISTORY: Right upper extremity weakness. FINDINGS: LIMITATIONS/ARTIFACTS: Examination moderately degraded by motion artifact. BONES AND ALIGNMENT: Straightening of the normal cervical lordosis. Normal vertebral body heights. Bone marrow signal is unremarkable. SPINAL CORD: Normal spinal cord size. No abnormal spinal cord signal. SOFT TISSUES: Mild edema within the posterior paraspinous musculature at the level of C3-C4, nonspecific, but could reflect changes of muscular injury and or strain. No paraspinal mass. C2-C3: Left sided uncovertebral spurring without significant disc bulge. Mild left sided facet hypertrophy. No spinal stenosis. Mild left C3 foraminal narrowing. C3-C4: Diffuse disc bulge with bilateral  uncovertebral spurring. Mild left greater than right facet hypertrophy. Mild spinal stenosis. Severe left with moderate right C4 foraminal narrowing. C4-C5: Left paracentral disc protrusion and density ventral thecal sac. Mild cord flattening but no visible cord signal changes. Superimposed bilateral facet hypertrophy. Resultant in mild to moderate spinal stenosis. Severe left C5 foraminal narrowing. Right neural foramen appears patent. C5-C6: Left paracentral disc protrusion flattens the ventral thecal sac. Mild cord flattening without visible cord signal changes. Right greater than left facet hypertrophy. Moderate spinal stenosis with moderate left C6 foraminal narrowing. Right neural foramen appears patent. C6-C7: Degenerative vertebral disc space narrowing. A broad posterior disc osteophyte mildly flattens the ventral thecal sac. Mild spinal stenosis. Mild right greater than left C7 foraminal narrowing. C7-T1: Negative interspace. Mild bilateral facet hypertrophy. No spinal stenosis. Mild left C8 foraminal narrowing. IMPRESSION: 1. Mild edema within the posterior paraspinous musculature at C3-C4, which may reflect muscular injury or strain. 2. No other acute.  abnormality within the cervical spine. 3. Multilevel cervical spondylosis with resultant mild to moderate diffuse spinal stenosis at C3-C4 through C6 c7. Associated severe left with moderate right C4 foraminal stenosis, severe left C5 foraminal narrowing, with moderate left C6 foraminal stenosis. Electronically signed by: Morene Hoard MD 10/25/2024 03:23 AM EDT RP Workstation: [id number]   MR BRAIN WO CONTRAST Result Date: 10/25/2024 EXAM: MRI BRAIN WITHOUT CONTRAST 10/25/2024 02:51:14 AM TECHNIQUE: Multiplanar multisequence MRI of the head/brain was performed without the administration of intravenous contrast. Examination degraded by motion. COMPARISON: Comparison with prior CT from 10/24/2024. CLINICAL HISTORY: Neuro deficit, acute, stroke  suspected. FINDINGS: BRAIN AND VENTRICLES: No acute infarct. No intracranial hemorrhage. No mass. No midline shift. Generalized age-related atrophy. Patchy T2/FLAIR hyperintensity involving the periventricular and deep white matter, consistent with chronic cerebral ischemic disease, moderate in nature. Mild ventricular prominence related to global parenchymal volume loss without hydrocephalus. The sella is unremarkable. Normal flow voids. ORBITS: Prior bilateral ocular lens replacement. SINUSES AND MASTOIDS: Trace bilateral mastoid effusions noted, of indeterminate significance. BONES AND SOFT TISSUES: Normal marrow signal. No acute soft tissue abnormality. IMPRESSION: 1. No acute intracranial abnormality. 2. Age-related cerebral atrophy with moderate chronic microvascular ischemic disease. Electronically signed by: Morene Hoard MD 10/25/2024 03:12 AM EDT RP Workstation: [id number]  CT Angio Chest PE W and/or Wo Contrast Result Date: 10/24/2024 EXAM: CTA CHEST PE WITHOUT AND WITH CONTRAST CT ABDOMEN AND PELVIS WITHOUT AND WITH CONTRAST 10/24/2024 08:56:53 PM TECHNIQUE: CTA of the chest was performed after the administration of 75 mL of iohexol (OMNIPAQUE) 350 MG/ML injection. Multiplanar reformatted images are provided for review. MIP images are provided for review. CT of the abdomen and pelvis was performed with the administration of intravenous contrast. Automated exposure control, iterative reconstruction, and/or weight based adjustment of the mA/kV was utilized to reduce the radiation dose to as low as reasonably achievable. COMPARISON: Comparison is made to 07/05/2023. CLINICAL HISTORY: Pulmonary embolism (PE) suspected, high prob; fever, pneumonia. FINDINGS: CHEST: PULMONARY ARTERIES: Pulmonary arteries are adequately opacified for evaluation. No intraluminal filling defect to suggest pulmonary embolism. Central pulmonary arteries are of normal caliber. Main pulmonary artery is normal in caliber.  MEDIASTINUM: No mediastinal lymphadenopathy. The heart demonstrates mild coronary artery calcification and global cardiac size is mildly enlarged. No pericardial effusion. Fusiform aneurysm of the ascending aorta measuring 4.3 cm in maximal diameter. Descending thoracic aorta is of normal caliber. Mild atherosclerotic calcification within the thoracic aorta. LUNGS AND PLEURA: Bibasilar atelectasis. Superimposed infiltrate within the posterior basal right lower lobe, possibly infectious in the acute setting. No pneumothorax or pleural effusion. SOFT TISSUES AND BONES: No acute bone or soft tissue abnormality. ABDOMEN AND PELVIS: LIVER: The liver is unremarkable. GALLBLADDER AND BILE DUCTS: Gallbladder is unremarkable. No biliary ductal dilatation. SPLEEN: Spleen demonstrates no acute abnormality. PANCREAS: Pancreas demonstrates no acute abnormality. ADRENAL GLANDS: Adrenal glands demonstrate no acute abnormality. KIDNEYS, URETERS AND BLADDER: Stable simple cortical cysts within the right kidney. The kidneys are otherwise unremarkable. No stones in the kidneys or ureters. No hydronephrosis. No perinephric or periureteral stranding. Urinary bladder is unremarkable. GI AND BOWEL: Appendix absent. The stomach, small bowel, and large bowel are otherwise unremarkable. There is no bowel obstruction. No abnormal bowel wall thickening or distension. REPRODUCTIVE: Reproductive organs are unremarkable. PERITONEUM AND RETROPERITONEUM: No ascites or free air. LYMPH NODES: No lymphadenopathy. BONES AND SOFT TISSUES: Osseous structures are age appropriate. No acute bone abnormality. No lytic or blastic bone lesion. Mild aortoiliac atherosclerotic calcification. No aortic aneurysm. No focal soft tissue abnormality. RAF SCORE: Aortic atherosclerosis (icd10-i70.0), aortic aneurysm (icd10-i71.9) IMPRESSION: 1. No pulmonary embolism. 2. Superimposed infiltrate within the posterior basal right lower lobe, possibly infectious in the acute  setting. 3. Fusiform aneurysm of the ascending thoracic aorta measuring 4.3 cm in maximal diameter. Recommend annual imaging follow-up by CTA or mra. This recommendation follows 2010 accf/aha/aats/ACR/asa/sca/scai/sir/sts/svm guidelines for the diagnosis and management of patients with thoracic aortic disease. Circulation. 2010; 121: Z733-z630. Aortic aneurysm nos (icd10-i71.9) 4. raf score: Aortic atherosclerosis (icd10-i70.0); aortic aneurysm (pri89-p28.9) Electronically signed by: Dorethia Molt MD 10/24/2024 09:31 PM EDT RP Workstation: [id number]   CT ABDOMEN PELVIS W CONTRAST Result Date: 10/24/2024 EXAM: CTA CHEST PE WITHOUT AND WITH CONTRAST CT ABDOMEN AND PELVIS WITHOUT AND WITH CONTRAST 10/24/2024 08:56:53 PM TECHNIQUE: CTA of the chest was performed after the administration of 75 mL of iohexol (OMNIPAQUE) 350 MG/ML injection. Multiplanar reformatted images are provided for review. MIP images are provided for review. CT of the abdomen and pelvis was performed with the administration of intravenous contrast. Automated exposure control, iterative reconstruction, and/or weight based adjustment of the mA/kV was utilized to reduce the radiation dose to as low as reasonably achievable. COMPARISON: Comparison is made to 07/05/2023. CLINICAL HISTORY: Pulmonary embolism (PE) suspected, high prob; fever, pneumonia. FINDINGS: CHEST:  PULMONARY ARTERIES: Pulmonary arteries are adequately opacified for evaluation. No intraluminal filling defect to suggest pulmonary embolism. Central pulmonary arteries are of normal caliber. Main pulmonary artery is normal in caliber. MEDIASTINUM: No mediastinal lymphadenopathy. The heart demonstrates mild coronary artery calcification and global cardiac size is mildly enlarged. No pericardial effusion. Fusiform aneurysm of the ascending aorta measuring 4.3 cm in maximal diameter. Descending thoracic aorta is of normal caliber. Mild atherosclerotic calcification within the thoracic  aorta. LUNGS AND PLEURA: Bibasilar atelectasis. Superimposed infiltrate within the posterior basal right lower lobe, possibly infectious in the acute setting. No pneumothorax or pleural effusion. SOFT TISSUES AND BONES: No acute bone or soft tissue abnormality. ABDOMEN AND PELVIS: LIVER: The liver is unremarkable. GALLBLADDER AND BILE DUCTS: Gallbladder is unremarkable. No biliary ductal dilatation. SPLEEN: Spleen demonstrates no acute abnormality. PANCREAS: Pancreas demonstrates no acute abnormality. ADRENAL GLANDS: Adrenal glands demonstrate no acute abnormality. KIDNEYS, URETERS AND BLADDER: Stable simple cortical cysts within the right kidney. The kidneys are otherwise unremarkable. No stones in the kidneys or ureters. No hydronephrosis. No perinephric or periureteral stranding. Urinary bladder is unremarkable. GI AND BOWEL: Appendix absent. The stomach, small bowel, and large bowel are otherwise unremarkable. There is no bowel obstruction. No abnormal bowel wall thickening or distension. REPRODUCTIVE: Reproductive organs are unremarkable. PERITONEUM AND RETROPERITONEUM: No ascites or free air. LYMPH NODES: No lymphadenopathy. BONES AND SOFT TISSUES: Osseous structures are age appropriate. No acute bone abnormality. No lytic or blastic bone lesion. Mild aortoiliac atherosclerotic calcification. No aortic aneurysm. No focal soft tissue abnormality. RAF SCORE: Aortic atherosclerosis (icd10-i70.0), aortic aneurysm (icd10-i71.9) IMPRESSION: 1. No pulmonary embolism. 2. Superimposed infiltrate within the posterior basal right lower lobe, possibly infectious in the acute setting. 3. Fusiform aneurysm of the ascending thoracic aorta measuring 4.3 cm in maximal diameter. Recommend annual imaging follow-up by CTA or mra. This recommendation follows 2010 accf/aha/aats/ACR/asa/sca/scai/sir/sts/svm guidelines for the diagnosis and management of patients with thoracic aortic disease. Circulation. 2010; 121: [id number]. Aortic  aneurysm nos (icd10-i71.9) 4. raf score: Aortic atherosclerosis (icd10-i70.0); aortic aneurysm (pri89-p28.9) Electronically signed by: Dorethia Molt MD 10/24/2024 09:31 PM EDT RP Workstation: [id number]   CT Head Wo Contrast Result Date: 10/24/2024 CLINICAL DATA:  Mental status change EXAM: CT HEAD WITHOUT CONTRAST TECHNIQUE: Contiguous axial images were obtained from the base of the skull through the vertex without intravenous contrast. RADIATION DOSE REDUCTION: This exam was performed according to the departmental dose-optimization program which includes automated exposure control, adjustment of the mA and/or kV according to patient size and/or use of iterative reconstruction technique. COMPARISON:  CT brain 10/20/2024 FINDINGS: Brain: No acute territorial infarction, hemorrhage or intracranial mass. Atrophy and chronic small vessel ischemic changes of the white matter. Stable ventricle size. Vascular: No hyperdense vessels.  No unexpected calcification Skull: Normal. Negative for fracture or focal lesion. Sinuses/Orbits: Mild mucosal thickening in the sinuses Other: Small none IMPRESSION: 1. No CT evidence for acute intracranial abnormality. 2. Atrophy and chronic small vessel ischemic changes of the white matter. Electronically Signed   By: Luke Bun M.D.   On: 10/24/2024 17:33   DG Shoulder Right Portable Result Date: 10/24/2024 CLINICAL DATA:  Altered mental status with new onset incontinence. EXAM: RIGHT SHOULDER - 1 VIEW COMPARISON:  None Available. FINDINGS: Mild-to-moderate degenerative change of the [facility] joint. Minimal degenerate change of the glenohumeral joint. No evidence of acute fracture or dislocation. Remainder of the exam is unremarkable. IMPRESSION: 1. No acute findings. 2. Degenerative changes as described. Electronically Signed   By: Toribio Agreste  M.D.   On: 10/24/2024 17:23   DG Chest Port 1 View Result Date: 10/24/2024 CLINICAL DATA:  Sepsis.  Altered mental status. EXAM:  PORTABLE CHEST 1 VIEW COMPARISON:  10/20/2024 FINDINGS: Lungs are hypoinflated and otherwise clear. Cardiomediastinal silhouette and remainder of the exam is unchanged. IMPRESSION: Hypoinflation without acute cardiopulmonary disease. Electronically Signed   By: Toribio Agreste M.D.   On: 10/24/2024 17:22   CT Cervical Spine Wo Contrast Result Date: 10/20/2024 EXAM: CT CERVICAL SPINE WITHOUT CONTRAST 10/20/2024 08:10:38 AM TECHNIQUE: CT of the cervical spine was performed without the administration of intravenous contrast. Multiplanar reformatted images are provided for review. Automated exposure control, iterative reconstruction, and/or weight based adjustment of the mA/kV was utilized to reduce the radiation dose to as low as reasonably achievable. COMPARISON: CT face and CT head today reported separately. CLINICAL HISTORY: 83 year old male. Neck trauma, multiple falls this week, bruised eye, AMS at baseline, no complaints. FINDINGS: CERVICAL SPINE: BONES AND ALIGNMENT: Maintained cervical lordosis. Normal bone mineralization for age. No acute fracture or traumatic malalignment. DEGENERATIVE CHANGES: Widespread degenerative cervical disc calcification. Calcified degenerative ligamentous hypertrophy about the odontoid. CT suggests mild degenerative cervical spinal stenosis. SOFT TISSUES: No prevertebral soft tissue swelling. Bulky left ICA calcified atherosclerosis. Negative visible noncontrast thoracic inlet. IMPRESSION: 1. No acute traumatic injury identified in the cervical spine. 2. Cervical disc and ligamentous degeneration. 3. left ICA atherosclerosis. Electronically signed by: Helayne Hurst MD 10/20/2024 08:25 AM EDT RP Workstation: [id number]   CT Maxillofacial Wo Contrast Result Date: 10/20/2024 EXAM: CT OF THE FACE WITHOUT CONTRAST 10/20/2024 08:10:38 AM TECHNIQUE: CT of the face was performed without the administration of intravenous contrast. Multiplanar reformatted images are provided for review.  Automated exposure control, iterative reconstruction, and/or weight based adjustment of the mA/kV was utilized to reduce the radiation dose to as low as reasonably achievable. COMPARISON: Head CT reported separately today. CLINICAL HISTORY: 83 year old male. Facial trauma, blunt; R periorbital trauma. Patient BIB EMS from home, where he lives with his daughter, after multiple falls this week. Patient had one fall where he has a bruised eye from. FINDINGS: FACIAL BONES: No acute facial fracture. No mandibular dislocation. No suspicious bone lesion. Underlying facial bones intact. ORBITS: Globes are intact. Postoperative changes to both globes. No acute traumatic injury. No inflammatory change. SINUSES AND MASTOIDS: Paranasal sinuses, middle ears and mastoids well aerated. SOFT TISSUES: Superficial right infraorbital, premalar soft tissue swelling and stranding on series 3 image 25. No soft tissue gas identified. VASCULATURE: Mild for age right carotid, moderate to severe left ICA calcified atherosclerosis in the visible neck. CERVICAL SPINE: Partially visible cervical spine degeneration. IMPRESSION: 1. Superficial right infraorbital soft tissue injury. 2. No facial bone fracture. 3. 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 Electronically signed by: Helayne Hurst MD 10/20/2024 08:23 AM EDT RP Workstation: [id number]   CT Head Wo Contrast Result Date: 10/20/2024 EXAM: CT HEAD WITHOUT CONTRAST 10/20/2024 08:10:38 AM TECHNIQUE: CT of the head was performed without the administration of intravenous contrast. Automated exposure control, iterative reconstruction, and/or weight based adjustment of the mA/kV was utilized to reduce the radiation dose to as low as reasonably achievable. COMPARISON: CT Head, 08/13/2024. CLINICAL HISTORY: 83 Year Old Male, Head Trauma. FINDINGS: BRAIN AND VENTRICLES: Stable brain volume. Stable, patchy, and confluent cerebral white matter hypodensity,  most pronounced in the left superior frontal gyrus posteriorly, perirolandic subcortical white matter. No convincing cortical encephalomalacia. No acute hemorrhage. No evidence of acute infarct.  No hydrocephalus. No extra-axial collection. No mass effect or midline shift. ORBITS: No acute abnormality. SINUSES: No acute abnormality. SOFT TISSUES AND SKULL: No acute soft tissue abnormality. No skull fracture. IMPRESSION: 1. No acute traumatic injury. 2. Stable white matter disease. Electronically signed by: Helayne Hurst MD 10/20/2024 08:19 AM EDT RP Workstation: [id number]   DG Pelvis Portable Result Date: 10/20/2024 EXAM: 1 or 2 VIEW(S) XRAY OF THE PELVIS 10/20/2024 07:53:00 AM COMPARISON: CT abdomen and pelvis 07/05/2023. CLINICAL HISTORY: 83 year old male with multiple falls this week, bruised eye, and AMS at baseline. FINDINGS: BONES AND JOINTS: No acute fracture. No focal osseous lesion. No joint dislocation. SOFT TISSUES: The soft tissues are unremarkable. ABDOMINAL CONTENTS: Negative visible bowel gas pattern. IMPRESSION: 1. No acute fracture or dislocation identified about the pelvis. Electronically signed by: Helayne Hurst MD 10/20/2024 08:03 AM EDT RP Workstation: [id number]   DG Chest Portable 1 View Result Date: 10/20/2024 EXAM: 1 VIEW XRAY OF THE CHEST 10/20/2024 07:53:00 AM COMPARISON: Portable chest x ray 08/13/2024. CLINICAL HISTORY: 83 year old male. Multiple falls this week, including one causing a bruised eye. Patient is not on thinners. AMS at baseline. No complaints. FINDINGS: LUNGS AND PLEURA: Lower lung volumes. No focal pulmonary opacity. No pulmonary edema. No pleural effusion. No pneumothorax. HEART AND MEDIASTINUM: No acute abnormality of the cardiac and mediastinal silhouettes. BONES AND SOFT TISSUES: No acute osseous abnormality. IMPRESSION: 1. lower lung volumes.  No acute cardiopulmonary abnormality. Electronically signed by: Helayne Hurst MD 10/20/2024 08:02 AM EDT RP Workstation:  [id number]    There are no new results to review at this time.  Previous records (including but not limited to H&P, progress notes, nursing notes, TOC management) were reviewed in assessment of this patient.  Labs: CBC: Recent Labs  Lab 10/24/24 1652 10/24/24 1700 10/25/24 1414 10/26/24 1209 10/27/24 0548 10/28/24 0524 10/29/24 0121  WBC 13.9*  --  11.8* 10.1 7.9 6.3 8.3  NEUTROABS 10.9*  --   --   --   --   --   --   HGB 16.6   < > 15.2 14.6 14.1 13.4 14.2  HCT 47.7   < > 44.2 43.1 41.5 39.7 41.1  MCV 98.4  --  98.2 98.6 97.6 98.3 96.7  PLT 278  --  260 317 320 325 405*   < > = values in this interval not displayed.   Basic Metabolic Panel: Recent Labs  Lab 10/25/24 1809 10/26/24 1209 10/27/24 0548 10/28/24 0524 10/28/24 1516 10/29/24 0121  NA 137 135 136  --  137 135  K 4.0 3.7 3.6  --  3.6 3.6  CL 105 106 105  --  106 107  CO2 22 17* 20*  --  22 18*  GLUCOSE 105* 117* 109*  --  110* 109*  BUN 22 25* 24*  --  21 16  CREATININE 1.31* 1.26* 1.26*  --  1.08 1.05  CALCIUM  8.4* 8.3* 8.4*  --  7.7* 8.2*  MG  --   --   --  2.2  --   --    Liver Function Tests: Recent Labs  Lab 10/25/24 1809 10/26/24 1209 10/27/24 0548 10/28/24 1516 10/29/24 0121  AST 39 144* 266* 235* 194*  ALT 36 93* 187* 222* 213*  ALKPHOS 67 91 129* 146* 165*  BILITOT 1.0 1.3* 1.4* 0.6 1.1  PROT 6.1* 6.2* 6.2* 5.9* 6.0*  ALBUMIN 2.4* 2.4* 2.4* 2.2* 2.3*   CBG: Recent Labs  Lab 10/25/24 1158 10/26/24 9367  GLUCAP 125* 110*    Scheduled Meds:  diclofenac Sodium  2 g Topical QID   donepezil   10 mg Oral QHS   feeding supplement  237 mL Oral BID BM   heparin   5,000 Units Subcutaneous Q8H   Continuous Infusions:  cefTRIAXone (ROCEPHIN)  IV Stopped (10/28/24 1548)   PRN Meds:.artificial tears, haloperidol lactate, hydrALAZINE  Family Communication: Wife at bedside  Disposition: Status is: Inpatient Remains inpatient appropriate because: Transaminitis, disposition     Time  spent: 35 minutes  Length of inpatient stay: 5 days  Author: Carliss LELON Canales, DO 10/29/2024 10:16 AM  For on call review [email or web address].

## 2024-10-29 NOTE — Care Management Important Message (Signed)
 Important Message  Patient Details  Name: Blake Atkins MRN: [id number] Date of Birth: 1941-09-04   Important Message Given:  Yes - Medicare IM     Claretta Deed 10/29/2024, 3:58 PM

## 2024-10-29 NOTE — Progress Notes (Signed)
 Speech Language Pathology Treatment: Dysphagia  Patient Details Name: Blake Atkins MRN: [id number] DOB: 1941-06-27 Today's Date: 10/29/2024 Time: 8988-8965 SLP Time Calculation (min) (ACUTE ONLY): 23 min  Assessment / Plan / Recommendation Clinical Impression  Pt's alertness has improved, sitting in chair and wife denies recent coughing with food/liquid. She did report pt is not fond of puree texture and SLP stated goal of session is for possible upgrade and safety/efficiency. Needed occasional cues to form lips around straw (due to mentation) but able to express without s/s aspiration with SLP.SABRA Masticated small pieces cracker with overall functional timing however he becomes distracted and starts to talk. Pt able to break down cracker, swallow leaving trace lingual residue (resembling puree texture) clearing with thin. He did have an immediate cough following one cracker trial that may not be significant however given current pna (suspicion of aspiration pna per critical care note) an MBS is recommended which will be performed tomorrow am ( d/t possible d/c tomorrow?). SLP upgraded to Dys 2 (minced), continue thin, crush pills. Wife is very attentive was receptive to education re: limiting distraction, small sips etc.    HPI HPI: 83 yo presenting 10/29 with fever, cough, AMS. CXR without evidence of infection although more suggestive on CT. MRI Brain negative. Admitted with possible sepsis with concern for PNA. PMH significant for dementia complicated by expressive aphasia, HTN, CVA, HLD, CKD III. Pt recently admitted 10/25-10/27 s/p fall.      SLP Plan  Continue with current plan of care;MBS          Recommendations  Diet recommendations: Dysphagia 2 (fine chop);Thin liquid Liquids provided via: Cup;Straw Medication Administration: Crushed with puree Supervision: Staff to assist with self feeding;Full supervision/cueing for compensatory strategies Compensations: Minimize environmental  distractions;Slow rate;Small sips/bites Postural Changes and/or Swallow Maneuvers: Seated upright 90 degrees                  Oral care BID   Frequent or constant Supervision/Assistance Dysphagia, unspecified (R13.10)     Continue with current plan of care;MBS     Dustin Olam Bull  10/29/2024, 10:54 AM

## 2024-10-29 NOTE — Progress Notes (Signed)
 Patient showing signs of occasional tremors for lack of a better term. It appears as though the patient is frequently startled due to history of dementia and/or delirium in the presence of active infection (pneumonia).  Patient had large full body tremor x1 overnight immediately before assessing midnight vital signs. It is as though the patient is being startled in his dreams; per wife at bedside he had always been a very active dreamer.  He frequently displays a very strong tensing repeatedly off and on primarily in the upper extremities, however I personally witnessed a large systemic tremor. It is my opinion this was not a seizure and the patient would be a poor candidate for an EEG in my opinion. Tremor lasted less than 5 seconds in total and in my opinion is related to dementia, confusion, agitation, restlessness, etc.  I requested PRN medications to be made available from overnight on-call. Haldol every 6 hours PRN order was placed with the intention to use only in the event of similar large tremors until otherwise ordered.

## 2024-10-29 NOTE — Progress Notes (Signed)
 Physical Therapy Treatment Patient Details Name: Blake Atkins MRN: [id number] DOB: 07-03-1941 Today's Date: 10/29/2024   History of Present Illness 83 yo M adm 10/29 for a fever.  Patient has a hx of recent falls, all xrays and CT scans without evidence of fracture. PMH significant for dementia complicated by expressive aphasia, HTN, CVA, HLD, CKD III.    PT Comments  Patient asleep initially but once aroused able to follow commands better and verbalizing a word or two occasionally.  Able to lean forward with multimodal cues, though does not maintain with focused attention.  Patient taking a few steps with facilitation for weight shifts, though still leaning posteriorly needing assist to sit after a few steps.  Patient remains appropriate for post-acute inpatient rehab (<3 hours/day) at d/c.     If plan is discharge home, recommend the following: Supervision due to cognitive status;Two people to help with walking and/or transfers;Two people to help with bathing/dressing/bathroom   Can travel by private vehicle     No  Equipment Recommendations  Wheelchair (measurements PT)    Recommendations for Other Services       Precautions / Restrictions Precautions Precautions: Fall Recall of Precautions/Restrictions: Impaired     Mobility  Bed Mobility Overal bed mobility: Needs Assistance Bed Mobility: Rolling, Sidelying to Sit Rolling: Mod assist Sidelying to sit: Max assist       General bed mobility comments: cooperative to flex knees with A and to roll reaching with L UE, assist for legs off bed and to lift trunk    Transfers Overall transfer level: Needs assistance Equipment used: Rolling walker (2 wheels) Transfers: Sit to/from Stand Sit to Stand: Mod assist, +2 physical assistance, From elevated surface           General transfer comment: lifting help from EOB assist for walker stability and pt pushing back with legs braced on edge of bed to stand; stood from recliner  x 1 with mod A x 2 to don new briefs    Ambulation/Gait Ambulation/Gait assistance: Mod assist, +2 physical assistance Gait Distance (Feet): 4 Feet Assistive device: Rolling walker (2 wheels) Gait Pattern/deviations: Step-to pattern, Decreased stride length, Wide base of support, Shuffle, Leaning posteriorly       General Gait Details: took steps with assist for lateral weight shift though progressive posterior lean despite support from behind to bring COG over BOS so placed chair and assisted to sit   Stairs             Wheelchair Mobility     Tilt Bed    Modified Rankin (Stroke Patients Only)       Balance Overall balance assessment: Needs assistance   Sitting balance-Leahy Scale: Zero Sitting balance - Comments: mod to max A pushing back when on EOB at times, though sits up some in response to cues, does not maintain Postural control: Posterior lean Standing balance support: Bilateral upper extremity supported Standing balance-Leahy Scale: Poor Standing balance comment: mod A static standing with walker though legs braced against bed                            Communication Communication Communication: Impaired Factors Affecting Communication: Hearing impaired;Difficulty expressing self  Cognition Arousal: Alert Behavior During Therapy: Anxious   PT - Cognitive impairments: History of cognitive impairments                       PT - Cognition  Comments: follows commands >50% of the time, though with multimodal cues and needs repitition Following commands: Impaired Following commands impaired: Follows one step commands inconsistently, Follows one step commands with increased time    Cueing Cueing Techniques: Verbal cues, Gestural cues, Visual cues  Exercises      General Comments General comments (skin integrity, edema, etc.): wife and visitors present; wearing brief soiled with urine so stood second time to change brief       Pertinent Vitals/Pain Pain Assessment Pain Assessment: Faces Faces Pain Scale: Hurts a little bit Pain Location: L elbow with repositioning Pain Descriptors / Indicators: Grimacing Pain Intervention(s): Monitored during session    Home Living                          Prior Function            PT Goals (current goals can now be found in the care plan section) Progress towards PT goals: Progressing toward goals    Frequency    Min 2X/week      PT Plan      Co-evaluation              AM-PAC PT 6 Clicks Mobility   Outcome Measure  Help needed turning from your back to your side while in a flat bed without using bedrails?: A Lot Help needed moving from lying on your back to sitting on the side of a flat bed without using bedrails?: Total Help needed moving to and from a bed to a chair (including a wheelchair)?: Total Help needed standing up from a chair using your arms (e.g., wheelchair or bedside chair)?: Total Help needed to walk in hospital room?: Total Help needed climbing 3-5 steps with a railing? : Total 6 Click Score: 7    End of Session Equipment Utilized During Treatment: Gait belt Activity Tolerance: Patient tolerated treatment well Patient left: in chair;with call bell/phone within reach;with chair alarm set;with family/visitor present   PT Visit Diagnosis: Repeated falls (R29.6);Other abnormalities of gait and mobility (R26.89)     Time: 9043-8979 PT Time Calculation (min) (ACUTE ONLY): 24 min  Charges:    $Therapeutic Activity: 23-37 mins PT General Charges $$ ACUTE PT VISIT: 1 Visit                     Micheline Portal, PT Acute Rehabilitation Services [phone] 10/29/2024    Montie Portal 10/29/2024, 1:30 PM

## 2024-10-29 NOTE — Progress Notes (Signed)
 PT Note Assisted for safety for pt back to bed due to pt unable to follow commands for coming forward or standing despite 2 attempts and trying to give him increased time to engage.  Finally assisted to lateral scoot back to bed with +2-3 A.  PT will continue to follow.     10/29/24 1337  PT Visit Information  Last PT Received On 10/29/24  Assistance Needed +2  History of Present Illness 83 yo M adm 10/29 for a fever.  Patient has a hx of recent falls, all xrays and CT scans without evidence of fracture. PMH significant for dementia complicated by expressive aphasia, HTN, CVA, HLD, CKD III.  Precautions  Precautions Fall  Recall of Precautions/Restrictions Impaired  Pain Assessment  Pain Assessment Faces  Faces Pain Scale 0  Cognition  Arousal Alert  Behavior During Therapy Anxious  PT - Cognitive impairments History of cognitive impairments  PT - Cognition Comments not following commands this session  Following Commands  Following commands Impaired  Following commands impaired Follows one step commands inconsistently  Cueing  Cueing Techniques Verbal cues;Gestural cues;Visual cues  Communication  Communication Impaired  Factors Affecting Communication Hearing impaired;Difficulty expressing self  Bed Mobility  Overal bed mobility Needs Assistance  General bed mobility comments total A +2 to scoot up in bed  Transfers  Overall transfer level Needs assistance  Equipment used Rolling walker (2 wheels)  Transfers Sit to/from Stand;Bed to chair/wheelchair/BSC  Sit to Stand +2 physical assistance;Total assist (unable to stand despite +2 lifting help, pt pushing back)  Bed to/from chair/wheelchair/BSC transfer type: Lateral/scoot transfer   Lateral/Scoot Transfers Total assist;+2 physical assistance (+3 with NT in the room)  General transfer comment patient recently awakened and multiple caregivers in the room due to pt had slid to front of the chair. patient able to initiated with  much increased time to lean forward though not able to engage with sit to stand transfer despite multimodal cues, slid to bed from drop arm recliner via bed pad.  Balance  Overall balance assessment Needs assistance  Sitting-balance support Feet supported  Sitting balance-Leahy Scale Zero  Sitting balance - Comments leaning back in chair, needing A to sit forward and pt initially A then pushes back  General Comments  General comments (skin integrity, edema, etc.) wife, NT, mobility specialist, PT colleague all in the room due to pt had slid forward in chair and needed respositioning for safety   PT - Assessment/Plan  PT Visit Diagnosis Repeated falls (R29.6);Other abnormalities of gait and mobility (R26.89)  PT Frequency (ACUTE ONLY) Min 2X/week  Follow Up Recommendations Skilled nursing-short term rehab (<3 hours/day)  Can patient physically be transported by private vehicle No  Patient can return home with the following Supervision due to cognitive status;Two people to help with walking and/or transfers;Two people to help with bathing/dressing/bathroom  PT equipment Wheelchair (measurements PT)  AM-PAC PT 6 Clicks Mobility Outcome Measure (Version 2)  Help needed turning from your back to your side while in a flat bed without using bedrails? 2  Help needed moving from lying on your back to sitting on the side of a flat bed without using bedrails? 1  Help needed moving to and from a bed to a chair (including a wheelchair)? 1  Help needed standing up from a chair using your arms (e.g., wheelchair or bedside chair)? 1  Help needed to walk in hospital room? 1  Help needed climbing 3-5 steps with a railing?  1  6  Click Score 7  Consider Recommendation of Discharge To: CIR/SNF/LTACH  Progressive Mobility  What is the highest level of mobility based on the mobility assessment? Level 1 (Bedfast) - Unable to balance while sitting on edge of bed  Activity Pivoted/transferred from bed to chair   PT Goal Progression  Progress towards PT goals Not progressing toward goals - comment  PT Time Calculation  PT Start Time (ACUTE ONLY) 1257  PT Stop Time (ACUTE ONLY) 1305  PT Time Calculation (min) (ACUTE ONLY) 8 min  PT General Charges  $$ ACUTE PT VISIT 1 Visit  PT Treatments  $Therapeutic Activity 8-22 mins   Micheline Portal, PT Acute Rehabilitation Services Office:[phone] 10/29/2024

## 2024-10-30 ENCOUNTER — Inpatient Hospital Stay (HOSPITAL_COMMUNITY)

## 2024-10-30 DIAGNOSIS — K719 Toxic liver disease, unspecified: Secondary | ICD-10-CM | POA: Diagnosis not present

## 2024-10-30 DIAGNOSIS — A419 Sepsis, unspecified organism: Secondary | ICD-10-CM | POA: Diagnosis not present

## 2024-10-30 DIAGNOSIS — M11261 Other chondrocalcinosis, right knee: Secondary | ICD-10-CM | POA: Diagnosis not present

## 2024-10-30 DIAGNOSIS — G9341 Metabolic encephalopathy: Secondary | ICD-10-CM | POA: Diagnosis not present

## 2024-10-30 LAB — CBC
HCT: 41.1 % (ref 39.0–52.0)
Hemoglobin: 14.1 g/dL (ref 13.0–17.0)
MCH: 33.6 pg (ref 26.0–34.0)
MCHC: 34.3 g/dL (ref 30.0–36.0)
MCV: 97.9 fL (ref 80.0–100.0)
Platelets: 408 K/uL — ABNORMAL HIGH (ref 150–400)
RBC: 4.2 MIL/uL — ABNORMAL LOW (ref 4.22–5.81)
RDW: 12.5 % (ref 11.5–15.5)
WBC: 8.5 K/uL (ref 4.0–10.5)
nRBC: 0 % (ref 0.0–0.2)

## 2024-10-30 LAB — COMPREHENSIVE METABOLIC PANEL WITH GFR
ALT: 185 U/L — ABNORMAL HIGH (ref 0–44)
AST: 126 U/L — ABNORMAL HIGH (ref 15–41)
Albumin: 2.2 g/dL — ABNORMAL LOW (ref 3.5–5.0)
Alkaline Phosphatase: 156 U/L — ABNORMAL HIGH (ref 38–126)
Anion gap: 12 (ref 5–15)
BUN: 18 mg/dL (ref 8–23)
CO2: 18 mmol/L — ABNORMAL LOW (ref 22–32)
Calcium: 8.3 mg/dL — ABNORMAL LOW (ref 8.9–10.3)
Chloride: 106 mmol/L (ref 98–111)
Creatinine, Ser: 1.12 mg/dL (ref 0.61–1.24)
GFR, Estimated: >60 mL/min (ref >60)
Glucose, Bld: 99 mg/dL (ref 70–99)
Potassium: 3.9 mmol/L (ref 3.5–5.1)
Sodium: 136 mmol/L (ref 135–145)
Total Bilirubin: 1.1 mg/dL (ref 0.0–1.2)
Total Protein: 5.9 g/dL — ABNORMAL LOW (ref 6.5–8.1)

## 2024-10-30 NOTE — Progress Notes (Signed)
 Modified Barium Swallow Study  Patient Details  Name: Blake Atkins MRN: [id number] Date of Birth: 06-21-1941  Today's Date: 10/30/2024  Modified Barium Swallow completed.  Full report located under Chart Review in the Imaging Section.  History of Present Illness 83 yo presenting 10/29 with fever, cough, AMS. CXR without evidence of infection although more suggestive on CT. MRI Brain negative. Admitted with possible sepsis with concern for PNA. PMH significant for dementia complicated by expressive aphasia, HTN, CVA, HLD, CKD III. Pt recently admitted 10/25-10/27 s/p fall.   Clinical Impression Pt presents with a functional pharyngeal swallow and mild oral dysphagia. Pharyngeal safety and efficiency was adequate with  effective laryngeal mobility and glottal protection preventing penetration or aspiration when challenged with larger straw sips. Initially appeared to have penetration however upon further inspection    this was material in the vallecular space not in trachea. Orally he displayed decreased cohesion with minimal thin spilling to valleculae clearing during swallow. Lingual pumping and delayed oral transit inconsistently. Mastication was mildy slow with trace lingual residue that cleared. He was unable to transit pill with thin or puree and was expectorated. Partial decreased PES distention with trace residue seen below PES inconsistently. Esophageal scan showed mildly slow transit however esophagus cleared without significant delay (MBS does not diagnose esophageal function below PES). Educated wife on recommendation of upgrading to regular diet (did not desire chopped meats) as she is very attentive, with pt at meals and will cut food if needed. Encouraged to check for pocketing. Pt disposition is home with wife. Continue thin liquids and suggest crush large pills and small with thin as able. No further ST needed. Factors that may increase risk of adverse event in presence of aspiration  Noe & Lianne 2021): Reduced cognitive function  Swallow Evaluation Recommendations Recommendations: PO diet PO Diet Recommendation: Regular;Thin liquids (Level 0) Liquid Administration via: Cup;Straw Medication Administration: Crushed with puree (or if can take small with thin) Supervision: Staff to assist with self-feeding Swallowing strategies  : Slow rate;Small bites/sips Postural changes: Position pt fully upright for meals Oral care recommendations: Oral care BID (2x/day)      Dustin Olam Bull 10/30/2024,10:41 AM

## 2024-10-30 NOTE — Progress Notes (Signed)
 Progress Note   Patient: Blake Atkins FMW:[id number] DOB: [birth year] DOA: 10/24/2024  DOS: the patient was seen and examined on 10/30/2024   Brief hospital course:  83 y.o. male with history of dementia complicated by expressive aphasia, hypertension, prior CVA, hyperlipidemia, chronic disease stage III, gout was brought to the ER after patient's wife found that patient was having fever  In the ER patient had a temperature of 101.2 F tachycardic with CT chest abdomen pelvis done shows features concerning for pneumonia.  Patient's right knee arthrocentesis with rare wbcs, no organisms seen.  Patient started on empiric antibiotics for possible pneumonia with concerning features for developing sepsis.     Assessment and Plan:   Concern for sepsis - Tachycardia, leukocytosis, fever, CT noting possible aspiration given concern for sepsis on presentation.  Blood cultures, empiric antibiotics, IV fluid bolus given.  Monitoring blood pressure closely.  Appears to be resolved.   Possible aspiration pneumonia - Persistent cough prior to presentation.  CT chest noting infarct treated in the RLL suspicious for aspiration or pneumonia.  Leukocytosis resolved.  Initially received empiric Zosyn, azithromycin, vancomycin.  Completed azithromycin, vancomycin discontinued.  Continue ceftriaxone (day 7of 7).    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  Right knee arthrocentesis performed.  Studies noting leukocytosis but low WBCs, no organisms.  Noted calcium  pyrophosphate crystals.  Responding well to topical Voltaren gel as well as one-time order of Toradol.  Limit NSAID use given CKD.  Showing improvement.   Metabolic encephalopathy/Alzheimer's dementia/aphasia - Multifactorial given underlying expressive aphasia, dementia, CVA, sepsis.  Appears to be showing improvement.  Talking, alert.  Will continue to monitor closely.   CKD 3 -  Creatinine appears to be around baseline.  Will recheck BMP in AM.   Acute transaminitis - Elevated liver enzymes 10/31 with initial upward trend.  Normal T. bili but elevated AST/ALT/alk phos suggesting direct liver injury.  CT abdomen on presentation unremarkable.  Right upper quadrant ultrasound noting no acute blockage, no Murphy sign but limited study.  Acetaminophen  negative.  Viral hepatitis panel negative.  Patient with no leukocytosis, abdominal pain, fever.  Given pattern, likely drug-induced liver injury (DILI).  Zosyn transition to ceftriaxone due to concern for DILI.  Appears to show small improvement again this morning.     Goals of care - Evaluated by PT/OT.  Recommending STR.  Working closely with patient, family, TOC on disposition planning.   Subjective: Resting comfortably this morning.  Talked with wife about disposition planning.  No acute events reported overnight.  Physical Exam:  Vitals:   10/29/24 2347 10/30/24 0430 10/30/24 0833 10/30/24 1206  BP: (!) 145/91 (!) 162/86 (!) 167/82 (!) 141/79  Pulse: 79 71 75 65  Resp:   20 20  Temp: 99.8 F (37.7 C) 98 F (36.7 C) (!) 97.5 F (36.4 C) (!) 97.5 F (36.4 C)  TempSrc:      SpO2: 97% 96% 95% 97%  Weight:      Height:        GENERAL:  Alert, pleasant, disheveled HEENT:  EOMI CARDIOVASCULAR:  RRR, no murmurs appreciated RESPIRATORY: Clear to auscultation bilaterally GASTROINTESTINAL:  Soft, nontender, nondistended EXTREMITIES: Right knee no longer edematous nor erythematous NEURO: Expressive aphasia, no new focal deficits appreciated SKIN:  No rashes noted PSYCH:  Appropriate mood and affect    Data Reviewed:  Imaging Studies: DG Swallowing Func-Speech Pathology Result Date: 10/30/2024 Table formatting from the original result was  not included. Modified Barium Swallow Study Patient Details Name: Blake Atkins MRN: [id number] Date of Birth: Apr 11, 1941 Today's Date: 10/30/2024 HPI/PMH: HPI: 83 yo presenting  10/29 with fever, cough, AMS. CXR without evidence of infection although more suggestive on CT. MRI Brain negative. Admitted with possible sepsis with concern for PNA. PMH significant for dementia complicated by expressive aphasia, HTN, CVA, HLD, CKD III. Pt recently admitted 10/25-10/27 s/p fall. Clinical Impression: Clinical Impression: Pt presents with a functional pharyngeal swallow and mild oral dysphagia. Pharyngeal safety and efficiency was adequate with  effective laryngeal mobility and glottal protection preventing penetration or aspiration when challenged with larger straw sips. Initially appeared to have penetration however upon further inspection    this was material in the vallecular space not in trachea. Orally he displayed decreased cohesion with minimal thin spilling to valleculae clearing during swallow. Lingual pumping and delayed oral transit inconsistently. Mastication was mildy slow with trace lingual residue that cleared. He was unable to transit pill with thin or puree and was expectorated. Partial decreased PES distention with trace residue seen below PES inconsistently. Esophageal scan showed mildly slow transit however esophagus cleared without significant delay (MBS does not diagnose esophageal function below PES). Educated wife on recommendation of upgrading to regular diet (did not desire chopped meats) as she is very attentive, with pt at meals and will cut food if needed. Encouraged to check for pocketing. Pt disposition is home with wife. Continue thin liquids and suggest crush large pills and small with thin as able. No further ST needed. Factors that may increase risk of adverse event in presence of aspiration Noe & Lianne 2021): Factors that may increase risk of adverse event in presence of aspiration Noe & Lianne 2021): Reduced cognitive function Recommendations/Plan: Swallowing Evaluation Recommendations Swallowing Evaluation Recommendations Recommendations: PO diet PO  Diet Recommendation: Regular; Thin liquids (Level 0) Liquid Administration via: Cup; Straw Medication Administration: Crushed with puree (or if can take small with thin) Supervision: Staff to assist with self-feeding Swallowing strategies  : Slow rate; Small bites/sips Postural changes: Position pt fully upright for meals Oral care recommendations: Oral care BID (2x/day) Treatment Plan Treatment Plan Treatment recommendations: No treatment recommended at this time Follow-up recommendations: No SLP follow up Functional status assessment: Patient has not had a recent decline in their functional status. Recommendations Recommendations for follow up therapy are one component of a multi-disciplinary discharge planning process, led by the attending physician.  Recommendations may be updated based on patient status, additional functional criteria and insurance authorization. Assessment: Orofacial Exam: Orofacial Exam Oral Cavity: Oral Hygiene: WFL Oral Cavity - Dentition: Adequate natural dentition Orofacial Anatomy: WFL Oral Motor/Sensory Function: WFL Anatomy: Anatomy: WFL Boluses Administered: Boluses Administered Boluses Administered: Thin liquids (Level 0); Mildly thick liquids (Level 2, nectar thick); Moderately thick liquids (Level 3, honey thick); Puree; Solid  Oral Impairment Domain: Oral Impairment Domain Lip Closure: No labial escape Tongue control during bolus hold: Posterior escape of less than half of bolus Bolus preparation/mastication: Slow prolonged chewing/mashing with complete recollection Bolus transport/lingual motion: Repetitive/disorganized tongue motion Oral residue: Trace residue lining oral structures (trace with cracker) Location of oral residue : Tongue Initiation of pharyngeal swallow : Pyriform sinuses  Pharyngeal Impairment Domain: Pharyngeal Impairment Domain Soft palate elevation: No bolus between soft palate (SP)/pharyngeal wall (PW) Laryngeal elevation: Complete superior movement of  thyroid cartilage with complete approximation of arytenoids to epiglottic petiole Anterior hyoid excursion: Partial anterior movement Epiglottic movement: Complete inversion Laryngeal vestibule closure: Complete, no air/contrast in laryngeal  vestibule Pharyngeal stripping wave : Present - complete Pharyngeal contraction (A/P view only): N/A Pharyngoesophageal segment opening: Partial distention/partial duration, partial obstruction of flow Tongue base retraction: No contrast between tongue base and posterior pharyngeal wall (PPW) Pharyngeal residue: Trace residue within or on pharyngeal structures Location of pharyngeal residue: Pyriform sinuses (second swallow clears)  Esophageal Impairment Domain: Esophageal Impairment Domain Esophageal clearance upright position: Complete clearance, esophageal coating Pill: Pill Consistency administered: Thin liquids (Level 0); Mildly thick liquids (Level 2, nectar thick); Moderately thick liquids (Level 3, honey thick); Puree Thin liquids (Level 0): Impaired (see clinical impressions) Puree: Impaired (see clinical impressions) (unable to transit pill orally with thin or puree) Penetration/Aspiration Scale Score: Penetration/Aspiration Scale Score 1.  Material does not enter airway: Thin liquids (Level 0); Mildly thick liquids (Level 2, nectar thick); Moderately thick liquids (Level 3, honey thick); Puree; Solid; Pill Compensatory Strategies: Compensatory Strategies Compensatory strategies: Yes Straw: Effective Effective Straw: Thin liquid (Level 0)   General Information: Caregiver present: No  Diet Prior to this Study: Dysphagia 2 (finely chopped); Thin liquids (Level 0)   Temperature : Normal   Respiratory Status: WFL   Supplemental O2: None (Room air)   History of Recent Intubation: No  Behavior/Cognition: Alert; Cooperative; Pleasant mood; Requires cueing Self-Feeding Abilities: Needs assist with self-feeding Baseline vocal quality/speech: Hypophonia/low volume No data  recorded No data recorded Exam Limitations: No limitations Goal Planning: No data recorded No data recorded No data recorded No data recorded Consulted and agree with results and recommendations: Pt unable/family or caregiver not available; Family member/caregiver; Physician; Nurse Pain: Pain Assessment Pain Assessment: No/denies pain Faces Pain Scale: 0 Breathing: 0 Negative Vocalization: 0 Facial Expression: 0 Body Language: 0 Consolability: 0 PAINAD Score: 0 Pain Location: L elbow with repositioning Pain Descriptors / Indicators: Grimacing Pain Intervention(s): Monitored during session End of Session: Start Time:SLP Start Time (ACUTE ONLY): 0916 Stop Time: SLP Stop Time (ACUTE ONLY): 0927 Time Calculation:SLP Time Calculation (min) (ACUTE ONLY): 11 min Charges: SLP Evaluations $ SLP Speech Visit: 1 Visit SLP Evaluations $MBS Swallow: 1 Procedure $Swallowing Treatment: 1 Procedure SLP visit diagnosis: SLP Visit Diagnosis: Dysphagia, oral phase (R13.11) Past Medical History: Past Medical History: Diagnosis Date  Arthritis   Bowen's disease of penis   x2  Carotid artery occlusion   Chronic kidney disease   Dementia (HCC)   Hyperlipidemia   Hypertension  Past Surgical History: Past Surgical History: Procedure Laterality Date  APPENDECTOMY    CAROTID ENDARTERECTOMY Right 2009  [facility]  COLONOSCOPY    EYE SURGERY Left Jan. 2014  Cataract and Lens implant  EYE SURGERY Right Feb. 2014  Cataract and Lens implant  INGUINAL HERNIA REPAIR Left 05/24/2024  Procedure: REPAIR, HERNIA, INGUINAL, ADULT;  Surgeon: Belinda Cough, MD;  Location: [facility] OR;  Service: General;  Laterality: Left;  LMA TAP BLOCK  KNEE SURGERY    right  PENILE BIOPSY N/A 06/08/2022  Procedure: EXCISION OF PENILE LESION;  Surgeon: Elisabeth Valli BIRCH, MD;  Location: [facility];  Service: Urology;  Laterality: N/A;  1 HR  UMBILICAL HERNIA REPAIR N/A 05/24/2024  Procedure: REPAIR, HERNIA, UMBILICAL, ADULT;  Surgeon: Belinda Cough, MD;  Location: [facility] OR;  Service: General;   Laterality: N/A;  LMA TAP BLOCK Dustin Olam Bull 10/30/2024, 10:41 AM  US  Abdomen Limited RUQ (LIVER/GB) Result Date: 10/27/2024 EXAM: Right Upper Quadrant Abdominal Ultrasound 10/27/2024 09:23:55 AM CLINICAL HISTORY: Elevated liver enzymes. TECHNIQUE: Real-time ultrasonography of the right upper quadrant of the abdomen was performed. COMPARISON: None available. FINDINGS:  LIVER: Visualized liver is somewhat echogenic which may be from hepatic steatosis. Poor visualization of the left hepatic lobe. No intrahepatic biliary ductal dilatation. No evidence of mass. BILIARY SYSTEM: No pericholecystic fluid. Gallbladder wall thickness measures 2.2 mm. No cholelithiasis. Negative sonographic Murphy's sign. Common bile duct measures 2.5 mm. OTHER: No right upper quadrant ascites. LIMITATIONS/ARTIFACTS: Reduced diagnostic sensitivity and specificity due to patient inability to cooperate during exams. For example, left lateral decubitus images could not be obtained. IMPRESSION: 1. Echogenic liver suggests hepatic steatosis; poor visualization of the left hepatic lobe limiting assessment. 2. Limited examination due to patient inability to cooperate, which reduces diagnostic sensitivity and specificity. Electronically signed by: Ryan Salvage MD 10/27/2024 11:06 AM EDT RP Workstation: [id number]   CT KNEE RIGHT WO CONTRAST Result Date: 10/27/2024 EXAM: CT RIGHT KNEE, WITHOUT IV CONTRAST 10/26/2024 01:30:00 PM TECHNIQUE: Axial images were acquired through the right knee without IV contrast. Reformatted images were reviewed. Automated exposure control, iterative reconstruction, and/or weight based adjustment of the mA/kV was utilized to reduce the radiation dose to as low as reasonably achievable. COMPARISON: CT Right Knee 08/14/2024. CLINICAL HISTORY: Altered mental status, septic arthritis suspected. Rule out septic knee. FINDINGS: BONES: No acute fracture or acute bony findings identified. Small jets along the  tibial spine. JOINTS: No dislocation. Prominent tricompartmental osteophyte formation with severe patellofemoral articular space narrowing, moderate to severe medial compartmental articular space narrowing, and moderate suspected chondral thinning in the lateral compartment. Extensive chondrocalcinosis including the menisci and along the joint capsule and hyaline cartilage surfaces. This could be a manifestation of CPPD arthropathy. Small free osteochondral fragments not excluded. Moderate knee effusion, similar to previous. No obvious change in the degree of synovial thickening along the suprapatellar bursa compared to 08/14/2024. No gas is present in the joint. SOFT TISSUES: The soft tissues are unremarkable. IMPRESSION: 1. Moderate knee effusion without interval change. 2. Severe tricompartmental osteoarthritis, including severe patellofemoral and moderate to severe medial compartment joint space narrowing with suspected lateral compartment chondral thinning. 3. Extensive chondrocalcinosis, which may reflect CPPD arthropathy. 4. No acute osseous abnormality. Electronically signed by: Ryan Salvage MD 10/27/2024 11:02 AM EDT RP Workstation: [id number]   CT SHOULDER RIGHT WO CONTRAST Result Date: 10/25/2024 CLINICAL DATA:  Osteomyelitis suspected, shoulder, xray done Right shoulder bruising.  Altered mental status. EXAM: CT OF THE UPPER RIGHT EXTREMITY WITHOUT CONTRAST TECHNIQUE: Multidetector CT imaging of the right shoulder was performed according to the standard protocol. RADIATION DOSE REDUCTION: This exam was performed according to the departmental dose-optimization program which includes automated exposure control, adjustment of the mA and/or kV according to patient size and/or use of iterative reconstruction technique. COMPARISON:  Radiographs and chest CTA 10/24/2024. No other comparison studies. FINDINGS: Bones/Joint/Cartilage No evidence of acute fracture, dislocation or humeral head  osteonecrosis. No cortical destruction or erosive changes demonstrated to suggest osteomyelitis or septic arthritis. There are mild glenohumeral and moderate acromioclavicular degenerative changes with multifocal chondrocalcinosis. No significant shoulder joint effusion apparent. Ligaments Suboptimally assessed by CT. Muscles and Tendons Possible mild atrophy of the subscapularis muscle. The rotator cuff otherwise appears unremarkable as evaluated by noncontrast CT. Soft tissues No evidence of periarticular fluid collection, foreign body, soft tissue emphysema or significant inflammation. Persistent patchy airspace disease in the right lower lobe which may reflect atelectasis or an infiltrate. IMPRESSION: 1. No CT evidence of osteomyelitis or septic arthritis. 2. Mild glenohumeral and moderate acromioclavicular degenerative changes with multifocal chondrocalcinosis. 3. Possible mild atrophy of the subscapularis muscle. 4. Persistent patchy airspace disease  in the right lower lobe which may reflect atelectasis or an infiltrate. Electronically Signed   By: Elsie Perone M.D.   On: 10/25/2024 08:57   MR CERVICAL SPINE W WO CONTRAST Result Date: 10/25/2024 EXAM: MRI CERVICAL SPINE WITHOUT AND WITH CONTRAST 10/25/2024 02:51:36 AM TECHNIQUE: Multiplanar multisequence MRI of the cervical spine was performed with and without intravenous contrast. 9mL gadobutrol (GADAVIST) 1 MMOL/ML injection. COMPARISON: CT from 10/20/2024. CLINICAL HISTORY: Right upper extremity weakness. FINDINGS: LIMITATIONS/ARTIFACTS: Examination moderately degraded by motion artifact. BONES AND ALIGNMENT: Straightening of the normal cervical lordosis. Normal vertebral body heights. Bone marrow signal is unremarkable. SPINAL CORD: Normal spinal cord size. No abnormal spinal cord signal. SOFT TISSUES: Mild edema within the posterior paraspinous musculature at the level of C3-C4, nonspecific, but could reflect changes of muscular injury and or  strain. No paraspinal mass. C2-C3: Left sided uncovertebral spurring without significant disc bulge. Mild left sided facet hypertrophy. No spinal stenosis. Mild left C3 foraminal narrowing. C3-C4: Diffuse disc bulge with bilateral uncovertebral spurring. Mild left greater than right facet hypertrophy. Mild spinal stenosis. Severe left with moderate right C4 foraminal narrowing. C4-C5: Left paracentral disc protrusion and density ventral thecal sac. Mild cord flattening but no visible cord signal changes. Superimposed bilateral facet hypertrophy. Resultant in mild to moderate spinal stenosis. Severe left C5 foraminal narrowing. Right neural foramen appears patent. C5-C6: Left paracentral disc protrusion flattens the ventral thecal sac. Mild cord flattening without visible cord signal changes. Right greater than left facet hypertrophy. Moderate spinal stenosis with moderate left C6 foraminal narrowing. Right neural foramen appears patent. C6-C7: Degenerative vertebral disc space narrowing. A broad posterior disc osteophyte mildly flattens the ventral thecal sac. Mild spinal stenosis. Mild right greater than left C7 foraminal narrowing. C7-T1: Negative interspace. Mild bilateral facet hypertrophy. No spinal stenosis. Mild left C8 foraminal narrowing. IMPRESSION: 1. Mild edema within the posterior paraspinous musculature at C3-C4, which may reflect muscular injury or strain. 2. No other acute.  abnormality within the cervical spine. 3. Multilevel cervical spondylosis with resultant mild to moderate diffuse spinal stenosis at C3-C4 through C6 c7. Associated severe left with moderate right C4 foraminal stenosis, severe left C5 foraminal narrowing, with moderate left C6 foraminal stenosis. Electronically signed by: Morene Hoard MD 10/25/2024 03:23 AM EDT RP Workstation: [id number]   MR BRAIN WO CONTRAST Result Date: 10/25/2024 EXAM: MRI BRAIN WITHOUT CONTRAST 10/25/2024 02:51:14 AM TECHNIQUE: Multiplanar  multisequence MRI of the head/brain was performed without the administration of intravenous contrast. Examination degraded by motion. COMPARISON: Comparison with prior CT from 10/24/2024. CLINICAL HISTORY: Neuro deficit, acute, stroke suspected. FINDINGS: BRAIN AND VENTRICLES: No acute infarct. No intracranial hemorrhage. No mass. No midline shift. Generalized age-related atrophy. Patchy T2/FLAIR hyperintensity involving the periventricular and deep white matter, consistent with chronic cerebral ischemic disease, moderate in nature. Mild ventricular prominence related to global parenchymal volume loss without hydrocephalus. The sella is unremarkable. Normal flow voids. ORBITS: Prior bilateral ocular lens replacement. SINUSES AND MASTOIDS: Trace bilateral mastoid effusions noted, of indeterminate significance. BONES AND SOFT TISSUES: Normal marrow signal. No acute soft tissue abnormality. IMPRESSION: 1. No acute intracranial abnormality. 2. Age-related cerebral atrophy with moderate chronic microvascular ischemic disease. Electronically signed by: Morene Hoard MD 10/25/2024 03:12 AM EDT RP Workstation: [id number]   CT Angio Chest PE W and/or Wo Contrast Result Date: 10/24/2024 EXAM: CTA CHEST PE WITHOUT AND WITH CONTRAST CT ABDOMEN AND PELVIS WITHOUT AND WITH CONTRAST 10/24/2024 08:56:53 PM TECHNIQUE: CTA of the chest was performed after the administration of 75  mL of iohexol (OMNIPAQUE) 350 MG/ML injection. Multiplanar reformatted images are provided for review. MIP images are provided for review. CT of the abdomen and pelvis was performed with the administration of intravenous contrast. Automated exposure control, iterative reconstruction, and/or weight based adjustment of the mA/kV was utilized to reduce the radiation dose to as low as reasonably achievable. COMPARISON: Comparison is made to 07/05/2023. CLINICAL HISTORY: Pulmonary embolism (PE) suspected, high prob; fever, pneumonia. FINDINGS: CHEST:  PULMONARY ARTERIES: Pulmonary arteries are adequately opacified for evaluation. No intraluminal filling defect to suggest pulmonary embolism. Central pulmonary arteries are of normal caliber. Main pulmonary artery is normal in caliber. MEDIASTINUM: No mediastinal lymphadenopathy. The heart demonstrates mild coronary artery calcification and global cardiac size is mildly enlarged. No pericardial effusion. Fusiform aneurysm of the ascending aorta measuring 4.3 cm in maximal diameter. Descending thoracic aorta is of normal caliber. Mild atherosclerotic calcification within the thoracic aorta. LUNGS AND PLEURA: Bibasilar atelectasis. Superimposed infiltrate within the posterior basal right lower lobe, possibly infectious in the acute setting. No pneumothorax or pleural effusion. SOFT TISSUES AND BONES: No acute bone or soft tissue abnormality. ABDOMEN AND PELVIS: LIVER: The liver is unremarkable. GALLBLADDER AND BILE DUCTS: Gallbladder is unremarkable. No biliary ductal dilatation. SPLEEN: Spleen demonstrates no acute abnormality. PANCREAS: Pancreas demonstrates no acute abnormality. ADRENAL GLANDS: Adrenal glands demonstrate no acute abnormality. KIDNEYS, URETERS AND BLADDER: Stable simple cortical cysts within the right kidney. The kidneys are otherwise unremarkable. No stones in the kidneys or ureters. No hydronephrosis. No perinephric or periureteral stranding. Urinary bladder is unremarkable. GI AND BOWEL: Appendix absent. The stomach, small bowel, and large bowel are otherwise unremarkable. There is no bowel obstruction. No abnormal bowel wall thickening or distension. REPRODUCTIVE: Reproductive organs are unremarkable. PERITONEUM AND RETROPERITONEUM: No ascites or free air. LYMPH NODES: No lymphadenopathy. BONES AND SOFT TISSUES: Osseous structures are age appropriate. No acute bone abnormality. No lytic or blastic bone lesion. Mild aortoiliac atherosclerotic calcification. No aortic aneurysm. No focal soft  tissue abnormality. RAF SCORE: Aortic atherosclerosis (icd10-i70.0), aortic aneurysm (icd10-i71.9) IMPRESSION: 1. No pulmonary embolism. 2. Superimposed infiltrate within the posterior basal right lower lobe, possibly infectious in the acute setting. 3. Fusiform aneurysm of the ascending thoracic aorta measuring 4.3 cm in maximal diameter. Recommend annual imaging follow-up by CTA or mra. This recommendation follows 2010 accf/aha/aats/ACR/asa/sca/scai/sir/sts/svm guidelines for the diagnosis and management of patients with thoracic aortic disease. Circulation. 2010; 121: Z733-z630. Aortic aneurysm nos (icd10-i71.9) 4. raf score: Aortic atherosclerosis (icd10-i70.0); aortic aneurysm (pri89-p28.9) Electronically signed by: Dorethia Molt MD 10/24/2024 09:31 PM EDT RP Workstation: [id number]   CT ABDOMEN PELVIS W CONTRAST Result Date: 10/24/2024 EXAM: CTA CHEST PE WITHOUT AND WITH CONTRAST CT ABDOMEN AND PELVIS WITHOUT AND WITH CONTRAST 10/24/2024 08:56:53 PM TECHNIQUE: CTA of the chest was performed after the administration of 75 mL of iohexol (OMNIPAQUE) 350 MG/ML injection. Multiplanar reformatted images are provided for review. MIP images are provided for review. CT of the abdomen and pelvis was performed with the administration of intravenous contrast. Automated exposure control, iterative reconstruction, and/or weight based adjustment of the mA/kV was utilized to reduce the radiation dose to as low as reasonably achievable. COMPARISON: Comparison is made to 07/05/2023. CLINICAL HISTORY: Pulmonary embolism (PE) suspected, high prob; fever, pneumonia. FINDINGS: CHEST: PULMONARY ARTERIES: Pulmonary arteries are adequately opacified for evaluation. No intraluminal filling defect to suggest pulmonary embolism. Central pulmonary arteries are of normal caliber. Main pulmonary artery is normal in caliber. MEDIASTINUM: No mediastinal lymphadenopathy. The heart demonstrates mild coronary artery calcification  and global  cardiac size is mildly enlarged. No pericardial effusion. Fusiform aneurysm of the ascending aorta measuring 4.3 cm in maximal diameter. Descending thoracic aorta is of normal caliber. Mild atherosclerotic calcification within the thoracic aorta. LUNGS AND PLEURA: Bibasilar atelectasis. Superimposed infiltrate within the posterior basal right lower lobe, possibly infectious in the acute setting. No pneumothorax or pleural effusion. SOFT TISSUES AND BONES: No acute bone or soft tissue abnormality. ABDOMEN AND PELVIS: LIVER: The liver is unremarkable. GALLBLADDER AND BILE DUCTS: Gallbladder is unremarkable. No biliary ductal dilatation. SPLEEN: Spleen demonstrates no acute abnormality. PANCREAS: Pancreas demonstrates no acute abnormality. ADRENAL GLANDS: Adrenal glands demonstrate no acute abnormality. KIDNEYS, URETERS AND BLADDER: Stable simple cortical cysts within the right kidney. The kidneys are otherwise unremarkable. No stones in the kidneys or ureters. No hydronephrosis. No perinephric or periureteral stranding. Urinary bladder is unremarkable. GI AND BOWEL: Appendix absent. The stomach, small bowel, and large bowel are otherwise unremarkable. There is no bowel obstruction. No abnormal bowel wall thickening or distension. REPRODUCTIVE: Reproductive organs are unremarkable. PERITONEUM AND RETROPERITONEUM: No ascites or free air. LYMPH NODES: No lymphadenopathy. BONES AND SOFT TISSUES: Osseous structures are age appropriate. No acute bone abnormality. No lytic or blastic bone lesion. Mild aortoiliac atherosclerotic calcification. No aortic aneurysm. No focal soft tissue abnormality. RAF SCORE: Aortic atherosclerosis (icd10-i70.0), aortic aneurysm (icd10-i71.9) IMPRESSION: 1. No pulmonary embolism. 2. Superimposed infiltrate within the posterior basal right lower lobe, possibly infectious in the acute setting. 3. Fusiform aneurysm of the ascending thoracic aorta measuring 4.3 cm in maximal diameter. Recommend  annual imaging follow-up by CTA or mra. This recommendation follows 2010 accf/aha/aats/ACR/asa/sca/scai/sir/sts/svm guidelines for the diagnosis and management of patients with thoracic aortic disease. Circulation. 2010; 121: Z733-z630. Aortic aneurysm nos (icd10-i71.9) 4. raf score: Aortic atherosclerosis (icd10-i70.0); aortic aneurysm (pri89-p28.9) Electronically signed by: Dorethia Molt MD 10/24/2024 09:31 PM EDT RP Workstation: [id number]   CT Head Wo Contrast Result Date: 10/24/2024 CLINICAL DATA:  Mental status change EXAM: CT HEAD WITHOUT CONTRAST TECHNIQUE: Contiguous axial images were obtained from the base of the skull through the vertex without intravenous contrast. RADIATION DOSE REDUCTION: This exam was performed according to the departmental dose-optimization program which includes automated exposure control, adjustment of the mA and/or kV according to patient size and/or use of iterative reconstruction technique. COMPARISON:  CT brain 10/20/2024 FINDINGS: Brain: No acute territorial infarction, hemorrhage or intracranial mass. Atrophy and chronic small vessel ischemic changes of the white matter. Stable ventricle size. Vascular: No hyperdense vessels.  No unexpected calcification Skull: Normal. Negative for fracture or focal lesion. Sinuses/Orbits: Mild mucosal thickening in the sinuses Other: Small none IMPRESSION: 1. No CT evidence for acute intracranial abnormality. 2. Atrophy and chronic small vessel ischemic changes of the white matter. Electronically Signed   By: Luke Bun M.D.   On: 10/24/2024 17:33   DG Shoulder Right Portable Result Date: 10/24/2024 CLINICAL DATA:  Altered mental status with new onset incontinence. EXAM: RIGHT SHOULDER - 1 VIEW COMPARISON:  None Available. FINDINGS: Mild-to-moderate degenerative change of the [facility] joint. Minimal degenerate change of the glenohumeral joint. No evidence of acute fracture or dislocation. Remainder of the exam is unremarkable.  IMPRESSION: 1. No acute findings. 2. Degenerative changes as described. Electronically Signed   By: Toribio Agreste M.D.   On: 10/24/2024 17:23   DG Chest Port 1 View Result Date: 10/24/2024 CLINICAL DATA:  Sepsis.  Altered mental status. EXAM: PORTABLE CHEST 1 VIEW COMPARISON:  10/20/2024 FINDINGS: Lungs are hypoinflated and otherwise clear. Cardiomediastinal silhouette  and remainder of the exam is unchanged. IMPRESSION: Hypoinflation without acute cardiopulmonary disease. Electronically Signed   By: Toribio Agreste M.D.   On: 10/24/2024 17:22   CT Cervical Spine Wo Contrast Result Date: 10/20/2024 EXAM: CT CERVICAL SPINE WITHOUT CONTRAST 10/20/2024 08:10:38 AM TECHNIQUE: CT of the cervical spine was performed without the administration of intravenous contrast. Multiplanar reformatted images are provided for review. Automated exposure control, iterative reconstruction, and/or weight based adjustment of the mA/kV was utilized to reduce the radiation dose to as low as reasonably achievable. COMPARISON: CT face and CT head today reported separately. CLINICAL HISTORY: 83 year old male. Neck trauma, multiple falls this week, bruised eye, AMS at baseline, no complaints. FINDINGS: CERVICAL SPINE: BONES AND ALIGNMENT: Maintained cervical lordosis. Normal bone mineralization for age. No acute fracture or traumatic malalignment. DEGENERATIVE CHANGES: Widespread degenerative cervical disc calcification. Calcified degenerative ligamentous hypertrophy about the odontoid. CT suggests mild degenerative cervical spinal stenosis. SOFT TISSUES: No prevertebral soft tissue swelling. Bulky left ICA calcified atherosclerosis. Negative visible noncontrast thoracic inlet. IMPRESSION: 1. No acute traumatic injury identified in the cervical spine. 2. Cervical disc and ligamentous degeneration. 3. left ICA atherosclerosis. Electronically signed by: Helayne Hurst MD 10/20/2024 08:25 AM EDT RP Workstation: [id number]   CT Maxillofacial Wo  Contrast Result Date: 10/20/2024 EXAM: CT OF THE FACE WITHOUT CONTRAST 10/20/2024 08:10:38 AM TECHNIQUE: CT of the face was performed without the administration of intravenous contrast. Multiplanar reformatted images are provided for review. Automated exposure control, iterative reconstruction, and/or weight based adjustment of the mA/kV was utilized to reduce the radiation dose to as low as reasonably achievable. COMPARISON: Head CT reported separately today. CLINICAL HISTORY: 83 year old male. Facial trauma, blunt; R periorbital trauma. Patient BIB EMS from home, where he lives with his daughter, after multiple falls this week. Patient had one fall where he has a bruised eye from. FINDINGS: FACIAL BONES: No acute facial fracture. No mandibular dislocation. No suspicious bone lesion. Underlying facial bones intact. ORBITS: Globes are intact. Postoperative changes to both globes. No acute traumatic injury. No inflammatory change. SINUSES AND MASTOIDS: Paranasal sinuses, middle ears and mastoids well aerated. SOFT TISSUES: Superficial right infraorbital, premalar soft tissue swelling and stranding on series 3 image 25. No soft tissue gas identified. VASCULATURE: Mild for age right carotid, moderate to severe left ICA calcified atherosclerosis in the visible neck. CERVICAL SPINE: Partially visible cervical spine degeneration. IMPRESSION: 1. Superficial right infraorbital soft tissue injury. 2. No facial bone fracture. 3. 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 Electronically signed by: Helayne Hurst MD 10/20/2024 08:23 AM EDT RP Workstation: [id number]   CT Head Wo Contrast Result Date: 10/20/2024 EXAM: CT HEAD WITHOUT CONTRAST 10/20/2024 08:10:38 AM TECHNIQUE: CT of the head was performed without the administration of intravenous contrast. Automated exposure control, iterative reconstruction, and/or weight based adjustment of the mA/kV was utilized to reduce the radiation  dose to as low as reasonably achievable. COMPARISON: CT Head, 08/13/2024. CLINICAL HISTORY: 83 Year Old Male, Head Trauma. FINDINGS: BRAIN AND VENTRICLES: Stable brain volume. Stable, patchy, and confluent cerebral white matter hypodensity, most pronounced in the left superior frontal gyrus posteriorly, perirolandic subcortical white matter. No convincing cortical encephalomalacia. No acute hemorrhage. No evidence of acute infarct. No hydrocephalus. No extra-axial collection. No mass effect or midline shift. ORBITS: No acute abnormality. SINUSES: No acute abnormality. SOFT TISSUES AND SKULL: No acute soft tissue abnormality. No skull fracture. IMPRESSION: 1. No acute traumatic injury. 2. Stable white matter disease.  Electronically signed by: Helayne Hurst MD 10/20/2024 08:19 AM EDT RP Workstation: [id number]   DG Pelvis Portable Result Date: 10/20/2024 EXAM: 1 or 2 VIEW(S) XRAY OF THE PELVIS 10/20/2024 07:53:00 AM COMPARISON: CT abdomen and pelvis 07/05/2023. CLINICAL HISTORY: 83 year old male with multiple falls this week, bruised eye, and AMS at baseline. FINDINGS: BONES AND JOINTS: No acute fracture. No focal osseous lesion. No joint dislocation. SOFT TISSUES: The soft tissues are unremarkable. ABDOMINAL CONTENTS: Negative visible bowel gas pattern. IMPRESSION: 1. No acute fracture or dislocation identified about the pelvis. Electronically signed by: Helayne Hurst MD 10/20/2024 08:03 AM EDT RP Workstation: [id number]   DG Chest Portable 1 View Result Date: 10/20/2024 EXAM: 1 VIEW XRAY OF THE CHEST 10/20/2024 07:53:00 AM COMPARISON: Portable chest x ray 08/13/2024. CLINICAL HISTORY: 83 year old male. Multiple falls this week, including one causing a bruised eye. Patient is not on thinners. AMS at baseline. No complaints. FINDINGS: LUNGS AND PLEURA: Lower lung volumes. No focal pulmonary opacity. No pulmonary edema. No pleural effusion. No pneumothorax. HEART AND MEDIASTINUM: No acute abnormality of the cardiac  and mediastinal silhouettes. BONES AND SOFT TISSUES: No acute osseous abnormality. IMPRESSION: 1. lower lung volumes.  No acute cardiopulmonary abnormality. Electronically signed by: Helayne Hurst MD 10/20/2024 08:02 AM EDT RP Workstation: [id number]    There are no new results to review at this time.  Previous records (including but not limited to H&P, progress notes, nursing notes, TOC management) were reviewed in assessment of this patient.  Labs: CBC: Recent Labs  Lab 10/24/24 1652 10/24/24 1700 10/26/24 1209 10/27/24 0548 10/28/24 0524 10/29/24 0121 10/30/24 0424  WBC 13.9*   < > 10.1 7.9 6.3 8.3 8.5  NEUTROABS 10.9*  --   --   --   --   --   --   HGB 16.6   < > 14.6 14.1 13.4 14.2 14.1  HCT 47.7   < > 43.1 41.5 39.7 41.1 41.1  MCV 98.4   < > 98.6 97.6 98.3 96.7 97.9  PLT 278   < > 317 320 325 405* 408*   < > = values in this interval not displayed.   Basic Metabolic Panel: Recent Labs  Lab 10/26/24 1209 10/27/24 0548 10/28/24 0524 10/28/24 1516 10/29/24 0121 10/30/24 0424  NA 135 136  --  137 135 136  K 3.7 3.6  --  3.6 3.6 3.9  CL 106 105  --  106 107 106  CO2 17* 20*  --  22 18* 18*  GLUCOSE 117* 109*  --  110* 109* 99  BUN 25* 24*  --  21 16 18   CREATININE 1.26* 1.26*  --  1.08 1.05 1.12  CALCIUM  8.3* 8.4*  --  7.7* 8.2* 8.3*  MG  --   --  2.2  --   --   --    Liver Function Tests: Recent Labs  Lab 10/26/24 1209 10/27/24 0548 10/28/24 1516 10/29/24 0121 10/30/24 0424  AST 144* 266* 235* 194* 126*  ALT 93* 187* 222* 213* 185*  ALKPHOS 91 129* 146* 165* 156*  BILITOT 1.3* 1.4* 0.6 1.1 1.1  PROT 6.2* 6.2* 5.9* 6.0* 5.9*  ALBUMIN 2.4* 2.4* 2.2* 2.3* 2.2*   CBG: Recent Labs  Lab 10/25/24 1158 10/26/24 0632  GLUCAP 125* 110*    Scheduled Meds:  diclofenac Sodium  2 g Topical QID   donepezil   10 mg Oral QHS   feeding supplement  237 mL Oral BID BM   heparin   5,000  Units Subcutaneous Q8H   Continuous Infusions:  cefTRIAXone (ROCEPHIN)  IV 2 g  (10/29/24 1551)   PRN Meds:.artificial tears, haloperidol lactate, hydrALAZINE  Family Communication: Wife at bedside  Disposition: Status is: Inpatient Remains inpatient appropriate because: Dispo     Time spent: 31 minutes  Length of inpatient stay: 6 days  Author: Carliss LELON Canales, DO 10/30/2024 1:00 PM  For on call review [email or web address].
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
## 2024-10-30 NOTE — Plan of Care (Signed)

## 2024-10-31 DIAGNOSIS — G9341 Metabolic encephalopathy: Secondary | ICD-10-CM | POA: Diagnosis not present

## 2024-10-31 DIAGNOSIS — Z8659 Personal history of other mental and behavioral disorders: Secondary | ICD-10-CM | POA: Diagnosis not present

## 2024-10-31 DIAGNOSIS — A419 Sepsis, unspecified organism: Secondary | ICD-10-CM | POA: Diagnosis not present

## 2024-10-31 DIAGNOSIS — N1832 Chronic kidney disease, stage 3b: Secondary | ICD-10-CM | POA: Diagnosis not present

## 2024-10-31 LAB — COMPREHENSIVE METABOLIC PANEL WITH GFR
ALT: 176 U/L — ABNORMAL HIGH (ref 0–44)
AST: 113 U/L — ABNORMAL HIGH (ref 15–41)
Albumin: 2.3 g/dL — ABNORMAL LOW (ref 3.5–5.0)
Alkaline Phosphatase: 161 U/L — ABNORMAL HIGH (ref 38–126)
Anion gap: 13 (ref 5–15)
BUN: 20 mg/dL (ref 8–23)
CO2: 19 mmol/L — ABNORMAL LOW (ref 22–32)
Calcium: 8.4 mg/dL — ABNORMAL LOW (ref 8.9–10.3)
Chloride: 104 mmol/L (ref 98–111)
Creatinine, Ser: 1.16 mg/dL (ref 0.61–1.24)
GFR, Estimated: >60 mL/min (ref >60)
Glucose, Bld: 91 mg/dL (ref 70–99)
Potassium: 3.5 mmol/L (ref 3.5–5.1)
Sodium: 136 mmol/L (ref 135–145)
Total Bilirubin: 0.8 mg/dL (ref 0.0–1.2)
Total Protein: 6.2 g/dL — ABNORMAL LOW (ref 6.5–8.1)

## 2024-10-31 MED ORDER — ENSURE PLUS HIGH PROTEIN PO LIQD: 237.0000 mL | Freq: Two times a day (BID) | ORAL | 1 refills | Status: AC

## 2024-10-31 MED ORDER — DICLOFENAC SODIUM 1 % EX GEL: 2.0000 g | Freq: Four times a day (QID) | CUTANEOUS | 0 refills | Status: AC

## 2024-10-31 NOTE — Plan of Care (Signed)

## 2024-10-31 NOTE — TOC Progression Note (Signed)
 Transition of Care [facility] - Progression Note    Patient Details  Name: Blake Atkins MRN: [id number] Date of Birth: 01-25-1941  [facility] CM/SW Contact  Rosaline JONELLE Joe, RN Phone Number: 10/31/2024, 10:03 AM  Clinical Narrative:    CM spoke with the patient's wife at the bedside and she has decided to take the patient home with home health services.  The wife spoke with [facility] and patient's wife with have 24 hour aide through them along with provision of [facility] lift.  MD was notified and will place discharge today after rounds.  Patient was active with [facility] and re-newed orders for [facility] PT, OT, RN, MSW placed to be co-signed by MD.  DME at the home includes Hospital bed, RW, Cane, 3:1.  Wife states that she sent her new WC back to [employer] but will need a new one.  DME order for WC placed and [employer] called to contact the wife and will deliver to the home tonight or in the am.  [facility] was set up as Will call - Bedside nursing please call [facility] when patient ready to go home - [facility] - [phone].   Expected Discharge Plan: Skilled Nursing Facility                 Expected Discharge Plan and Services       Living arrangements for the past 2 months: Single Family Home                             HH Agency: [facility]         Social Drivers of Health (SDOH) Interventions SDOH Screenings   Food Insecurity: No Food Insecurity (10/25/2024)  Housing: Low Risk  (10/25/2024)  Transportation Needs: No Transportation Needs (10/25/2024)  Utilities: Not At Risk (10/25/2024)  Social Connections: Socially Integrated (10/25/2024)  Tobacco Use: Low Risk  (10/25/2024)    Readmission Risk Interventions    10/31/2024   10:03 AM 10/31/2024    8:52 AM  Readmission Risk Prevention Plan  Transportation Screening Complete Complete  PCP or Specialist Appt within 5-7 Days Complete Complete  Home Care Screening Complete Complete  Medication  Review (RN CM) Complete Complete

## 2024-10-31 NOTE — Discharge Summary (Signed)
 Physician Discharge Summary   Patient: Blake Atkins MRN: [id number] DOB: May 17, 1941  Admit date:     10/24/2024  Discharge date: {dischdate:26783}  Discharge Physician: Concepcion Riser   PCP: Loreli Elsie JONETTA [name], MD   Recommendations at discharge:  {Tip this will not be part of the note when signed- Example include specific recommendations for outpatient follow-up, pending tests to follow-up on. (Optional):26781}  ***  Discharge Diagnoses: Principal Problem:   Sepsis (HCC) Active Problems:   History of dementia   History of expressive aphasia   Essential hypertension   CKD stage 3b, GFR 30-44 ml/min (HCC)   Community acquired pneumonia   Acute metabolic encephalopathy   Drug-induced liver injury   Pressure injury of skin   Pseudogout of right knee  Resolved Problems:   * No resolved hospital problems. [facility] Course: No notes on file  Assessment and Plan: No notes have been filed under this hospital service. Service: Hospitalist     {Tip this will not be part of the note when signed Body mass index is 28.16 kg/m. , ,  Active Pressure Injury/Wound(s)     None          (Optional):26781}  {(NOTE) Pain control PDMP Statment (Optional):26782} Consultants: *** Procedures performed: ***  Disposition: {Plan; Disposition:26390} Diet recommendation:  Discharge Diet Orders (From admission, onward)     Start     Ordered   10/31/24 0000  Diet general        10/31/24 1130           {Diet_Plan:26776} DISCHARGE MEDICATION: Allergies as of 10/31/2024   No Known Allergies      Medication List     TAKE these medications    acetaminophen  500 MG tablet Commonly known as: TYLENOL  Take 1,000 mg by mouth every 8 (eight) hours as needed for moderate pain.   allopurinol 100 MG tablet Commonly known as: ZYLOPRIM Take 100 mg by mouth daily.   aspirin  EC 81 MG tablet Take 81 mg by mouth daily.   colchicine  0.6 MG tablet Take 1 tablet (0.6 mg  total) by mouth daily for 10 days. What changed:  how much to take when to take this reasons to take this additional instructions   diclofenac Sodium 1 % Gel Commonly known as: VOLTAREN Apply 2 g topically 4 (four) times daily.   donepezil  10 MG tablet Commonly known as: ARICEPT  Take 10 mg by mouth at bedtime.   feeding supplement Liqd Take 237 mLs by mouth 2 (two) times daily between meals.   olmesartan 20 MG tablet Commonly known as: BENICAR Take 20 mg by mouth daily.   PreserVision AREDS 2 Caps Take 1 capsule by mouth in the morning and at bedtime.   rosuvastatin  20 MG tablet Commonly known as: CRESTOR  Take 20 mg by mouth daily.   Systane Ultra PF 0.4-0.3 % Soln Generic drug: Polyethyl Glyc-Propyl Glyc PF Place 1 drop into both eyes 2 (two) times daily as needed (dry eyes).               Durable Medical Equipment  (From admission, onward)           Start     Ordered   10/31/24 0951  For home use only DME standard manual wheelchair with seat cushion  Once       Comments: Patient suffers from sepsis, aspiration pneumonia, metabolic encephalopathy, alzheimer's dementia which impairs their ability to perform daily activities like toileting in the home.  A walker  will not resolve issue with performing activities of daily living. A wheelchair will allow patient to safely perform daily activities. Patient can safely propel the wheelchair in the home or has a caregiver who can provide assistance. Length of need Lifetime. Accessories: elevating leg rests (ELRs), wheel locks, extensions and anti-tippers.  Weight - 86 kg, height - 5'9   10/31/24 0953            Follow-up Information     Care, [facility] Follow up.   Specialty: Home Health Services Why: [facility] will provide home health services.  They will call you in the next 24-48 hours to set up services. Contact information: [address] [place]  [address] [phone]         Care, [facility] Follow up.   Why: [employer] will deliver a WC to your home this evening or in the morning 11/01/24. Contact information: [address] [place] [address] [phone]                Discharge Exam: Filed Weights   10/24/24 1641  Weight: 86.5 kg   ***  Condition at discharge: {DC Condition:26389}  The results of significant diagnostics from this hospitalization (including imaging, microbiology, ancillary and laboratory) are listed below for reference.   Imaging Studies: DG Swallowing Func-Speech Pathology Result Date: 10/30/2024 Table formatting from the original result was not included. Modified Barium Swallow Study Patient Details Name: Blake Atkins MRN: [id number] Date of Birth: 1941/12/10 Today's Date: 10/30/2024 HPI/PMH: HPI: 83 yo presenting 10/29 with fever, cough, AMS. CXR without evidence of infection although more suggestive on CT. MRI Brain negative. Admitted with possible sepsis with concern for PNA. PMH significant for dementia complicated by expressive aphasia, HTN, CVA, HLD, CKD III. Pt recently admitted 10/25-10/27 s/p fall. Clinical Impression: Clinical Impression: Pt presents with a functional pharyngeal swallow and mild oral dysphagia. Pharyngeal safety and efficiency was adequate with  effective laryngeal mobility and glottal protection preventing penetration or aspiration when challenged with larger straw sips. Initially appeared to have penetration however upon further inspection    this was material in the vallecular space not in trachea. Orally he displayed decreased cohesion with minimal thin spilling to valleculae clearing during swallow. Lingual pumping and delayed oral transit inconsistently. Mastication was mildy slow with trace lingual residue that cleared. He was unable to transit pill with thin or puree and was expectorated. Partial decreased PES distention with trace residue seen below PES  inconsistently. Esophageal scan showed mildly slow transit however esophagus cleared without significant delay (MBS does not diagnose esophageal function below PES). Educated wife on recommendation of upgrading to regular diet (did not desire chopped meats) as she is very attentive, with pt at meals and will cut food if needed. Encouraged to check for pocketing. Pt disposition is home with wife. Continue thin liquids and suggest crush large pills and small with thin as able. No further ST needed. Factors that may increase risk of adverse event in presence of aspiration Noe & Lianne 2021): Factors that may increase risk of adverse event in presence of aspiration Noe & Lianne 2021): Reduced cognitive function Recommendations/Plan: Swallowing Evaluation Recommendations Swallowing Evaluation Recommendations Recommendations: PO diet PO Diet Recommendation: Regular; Thin liquids (Level 0) Liquid Administration via: Cup; Straw Medication Administration: Crushed with puree (or if can take small with thin) Supervision: Staff to assist with self-feeding Swallowing strategies  : Slow rate; Small bites/sips Postural changes: Position pt fully upright for meals Oral  care recommendations: Oral care BID (2x/day) Treatment Plan Treatment Plan Treatment recommendations: No treatment recommended at this time Follow-up recommendations: No SLP follow up Functional status assessment: Patient has not had a recent decline in their functional status. Recommendations Recommendations for follow up therapy are one component of a multi-disciplinary discharge planning process, led by the attending physician.  Recommendations may be updated based on patient status, additional functional criteria and insurance authorization. Assessment: Orofacial Exam: Orofacial Exam Oral Cavity: Oral Hygiene: WFL Oral Cavity - Dentition: Adequate natural dentition Orofacial Anatomy: WFL Oral Motor/Sensory Function: WFL Anatomy: Anatomy: WFL Boluses  Administered: Boluses Administered Boluses Administered: Thin liquids (Level 0); Mildly thick liquids (Level 2, nectar thick); Moderately thick liquids (Level 3, honey thick); Puree; Solid  Oral Impairment Domain: Oral Impairment Domain Lip Closure: No labial escape Tongue control during bolus hold: Posterior escape of less than half of bolus Bolus preparation/mastication: Slow prolonged chewing/mashing with complete recollection Bolus transport/lingual motion: Repetitive/disorganized tongue motion Oral residue: Trace residue lining oral structures (trace with cracker) Location of oral residue : Tongue Initiation of pharyngeal swallow : Pyriform sinuses  Pharyngeal Impairment Domain: Pharyngeal Impairment Domain Soft palate elevation: No bolus between soft palate (SP)/pharyngeal wall (PW) Laryngeal elevation: Complete superior movement of thyroid cartilage with complete approximation of arytenoids to epiglottic petiole Anterior hyoid excursion: Partial anterior movement Epiglottic movement: Complete inversion Laryngeal vestibule closure: Complete, no air/contrast in laryngeal vestibule Pharyngeal stripping wave : Present - complete Pharyngeal contraction (A/P view only): N/A Pharyngoesophageal segment opening: Partial distention/partial duration, partial obstruction of flow Tongue base retraction: No contrast between tongue base and posterior pharyngeal wall (PPW) Pharyngeal residue: Trace residue within or on pharyngeal structures Location of pharyngeal residue: Pyriform sinuses (second swallow clears)  Esophageal Impairment Domain: Esophageal Impairment Domain Esophageal clearance upright position: Complete clearance, esophageal coating Pill: Pill Consistency administered: Thin liquids (Level 0); Mildly thick liquids (Level 2, nectar thick); Moderately thick liquids (Level 3, honey thick); Puree Thin liquids (Level 0): Impaired (see clinical impressions) Puree: Impaired (see clinical impressions) (unable to  transit pill orally with thin or puree) Penetration/Aspiration Scale Score: Penetration/Aspiration Scale Score 1.  Material does not enter airway: Thin liquids (Level 0); Mildly thick liquids (Level 2, nectar thick); Moderately thick liquids (Level 3, honey thick); Puree; Solid; Pill Compensatory Strategies: Compensatory Strategies Compensatory strategies: Yes Straw: Effective Effective Straw: Thin liquid (Level 0)   General Information: Caregiver present: No  Diet Prior to this Study: Dysphagia 2 (finely chopped); Thin liquids (Level 0)   Temperature : Normal   Respiratory Status: WFL   Supplemental O2: None (Room air)   History of Recent Intubation: No  Behavior/Cognition: Alert; Cooperative; Pleasant mood; Requires cueing Self-Feeding Abilities: Needs assist with self-feeding Baseline vocal quality/speech: Hypophonia/low volume No data recorded No data recorded Exam Limitations: No limitations Goal Planning: No data recorded No data recorded No data recorded No data recorded Consulted and agree with results and recommendations: Pt unable/family or caregiver not available; Family member/caregiver; Physician; Nurse Pain: Pain Assessment Pain Assessment: No/denies pain Faces Pain Scale: 0 Breathing: 0 Negative Vocalization: 0 Facial Expression: 0 Body Language: 0 Consolability: 0 PAINAD Score: 0 Pain Location: L elbow with repositioning Pain Descriptors / Indicators: Grimacing Pain Intervention(s): Monitored during session End of Session: Start Time:SLP Start Time (ACUTE ONLY): 9083 Stop Time: SLP Stop Time (ACUTE ONLY): 9072 Time Calculation:SLP Time Calculation (min) (ACUTE ONLY): 11 min Charges: SLP Evaluations $ SLP Speech Visit: 1 Visit SLP Evaluations $MBS Swallow: 1 Procedure $Swallowing Treatment: 1 Procedure  SLP visit diagnosis: SLP Visit Diagnosis: Dysphagia, oral phase (R13.11) Past Medical History: Past Medical History: Diagnosis Date  Arthritis   Bowen's disease of penis   x2  Carotid artery occlusion    Chronic kidney disease   Dementia (HCC)   Hyperlipidemia   Hypertension  Past Surgical History: Past Surgical History: Procedure Laterality Date  APPENDECTOMY    CAROTID ENDARTERECTOMY Right 2009  CEA  COLONOSCOPY    EYE SURGERY Left Jan. 2014  Cataract and Lens implant  EYE SURGERY Right Feb. 2014  Cataract and Lens implant  INGUINAL HERNIA REPAIR Left 05/24/2024  Procedure: REPAIR, HERNIA, INGUINAL, ADULT;  Surgeon: Belinda Cough, MD;  Location: [facility] OR;  Service: General;  Laterality: Left;  LMA TAP BLOCK  KNEE SURGERY    right  PENILE BIOPSY N/A 06/08/2022  Procedure: EXCISION OF PENILE LESION;  Surgeon: Elisabeth Valli BIRCH, MD;  Location: [facility] ORS;  Service: Urology;  Laterality: N/A;  1 HR  UMBILICAL HERNIA REPAIR N/A 05/24/2024  Procedure: REPAIR, HERNIA, UMBILICAL, ADULT;  Surgeon: Belinda Cough, MD;  Location: [facility] OR;  Service: General;  Laterality: N/A;  LMA TAP BLOCK Dustin Olam Bull 10/30/2024, 10:41 AM  US  Abdomen Limited RUQ (LIVER/GB) Result Date: 10/27/2024 EXAM: Right Upper Quadrant Abdominal Ultrasound 10/27/2024 09:23:55 AM CLINICAL HISTORY: Elevated liver enzymes. TECHNIQUE: Real-time ultrasonography of the right upper quadrant of the abdomen was performed. COMPARISON: None available. FINDINGS: LIVER: Visualized liver is somewhat echogenic which may be from hepatic steatosis. Poor visualization of the left hepatic lobe. No intrahepatic biliary ductal dilatation. No evidence of mass. BILIARY SYSTEM: No pericholecystic fluid. Gallbladder wall thickness measures 2.2 mm. No cholelithiasis. Negative sonographic Murphy's sign. Common bile duct measures 2.5 mm. OTHER: No right upper quadrant ascites. LIMITATIONS/ARTIFACTS: Reduced diagnostic sensitivity and specificity due to patient inability to cooperate during exams. For example, left lateral decubitus images could not be obtained. IMPRESSION: 1. Echogenic liver suggests hepatic steatosis; poor visualization of the left hepatic lobe limiting  assessment. 2. Limited examination due to patient inability to cooperate, which reduces diagnostic sensitivity and specificity. Electronically signed by: Ryan Salvage MD 10/27/2024 11:06 AM EDT RP Workstation: [id number]   CT KNEE RIGHT WO CONTRAST Result Date: 10/27/2024 EXAM: CT RIGHT KNEE, WITHOUT IV CONTRAST 10/26/2024 01:30:00 PM TECHNIQUE: Axial images were acquired through the right knee without IV contrast. Reformatted images were reviewed. Automated exposure control, iterative reconstruction, and/or weight based adjustment of the mA/kV was utilized to reduce the radiation dose to as low as reasonably achievable. COMPARISON: CT Right Knee 08/14/2024. CLINICAL HISTORY: Altered mental status, septic arthritis suspected. Rule out septic knee. FINDINGS: BONES: No acute fracture or acute bony findings identified. Small jets along the tibial spine. JOINTS: No dislocation. Prominent tricompartmental osteophyte formation with severe patellofemoral articular space narrowing, moderate to severe medial compartmental articular space narrowing, and moderate suspected chondral thinning in the lateral compartment. Extensive chondrocalcinosis including the menisci and along the joint capsule and hyaline cartilage surfaces. This could be a manifestation of CPPD arthropathy. Small free osteochondral fragments not excluded. Moderate knee effusion, similar to previous. No obvious change in the degree of synovial thickening along the suprapatellar bursa compared to 08/14/2024. No gas is present in the joint. SOFT TISSUES: The soft tissues are unremarkable. IMPRESSION: 1. Moderate knee effusion without interval change. 2. Severe tricompartmental osteoarthritis, including severe patellofemoral and moderate to severe medial compartment joint space narrowing with suspected lateral compartment chondral thinning. 3. Extensive chondrocalcinosis, which may reflect CPPD arthropathy. 4. No acute osseous abnormality. Electronically  signed by: Ryan Salvage MD 10/27/2024 11:02 AM EDT RP Workstation: [id number]   CT SHOULDER RIGHT WO CONTRAST Result Date: 10/25/2024 CLINICAL DATA:  Osteomyelitis suspected, shoulder, xray done Right shoulder bruising.  Altered mental status. EXAM: CT OF THE UPPER RIGHT EXTREMITY WITHOUT CONTRAST TECHNIQUE: Multidetector CT imaging of the right shoulder was performed according to the standard protocol. RADIATION DOSE REDUCTION: This exam was performed according to the departmental dose-optimization program which includes automated exposure control, adjustment of the mA and/or kV according to patient size and/or use of iterative reconstruction technique. COMPARISON:  Radiographs and chest CTA 10/24/2024. No other comparison studies. FINDINGS: Bones/Joint/Cartilage No evidence of acute fracture, dislocation or humeral head osteonecrosis. No cortical destruction or erosive changes demonstrated to suggest osteomyelitis or septic arthritis. There are mild glenohumeral and moderate acromioclavicular degenerative changes with multifocal chondrocalcinosis. No significant shoulder joint effusion apparent. Ligaments Suboptimally assessed by CT. Muscles and Tendons Possible mild atrophy of the subscapularis muscle. The rotator cuff otherwise appears unremarkable as evaluated by noncontrast CT. Soft tissues No evidence of periarticular fluid collection, foreign body, soft tissue emphysema or significant inflammation. Persistent patchy airspace disease in the right lower lobe which may reflect atelectasis or an infiltrate. IMPRESSION: 1. No CT evidence of osteomyelitis or septic arthritis. 2. Mild glenohumeral and moderate acromioclavicular degenerative changes with multifocal chondrocalcinosis. 3. Possible mild atrophy of the subscapularis muscle. 4. Persistent patchy airspace disease in the right lower lobe which may reflect atelectasis or an infiltrate. Electronically Signed   By: Elsie Perone M.D.   On:  10/25/2024 08:57   MR CERVICAL SPINE W WO CONTRAST Result Date: 10/25/2024 EXAM: MRI CERVICAL SPINE WITHOUT AND WITH CONTRAST 10/25/2024 02:51:36 AM TECHNIQUE: Multiplanar multisequence MRI of the cervical spine was performed with and without intravenous contrast. 9mL gadobutrol (GADAVIST) 1 MMOL/ML injection. COMPARISON: CT from 10/20/2024. CLINICAL HISTORY: Right upper extremity weakness. FINDINGS: LIMITATIONS/ARTIFACTS: Examination moderately degraded by motion artifact. BONES AND ALIGNMENT: Straightening of the normal cervical lordosis. Normal vertebral body heights. Bone marrow signal is unremarkable. SPINAL CORD: Normal spinal cord size. No abnormal spinal cord signal. SOFT TISSUES: Mild edema within the posterior paraspinous musculature at the level of C3-C4, nonspecific, but could reflect changes of muscular injury and or strain. No paraspinal mass. C2-C3: Left sided uncovertebral spurring without significant disc bulge. Mild left sided facet hypertrophy. No spinal stenosis. Mild left C3 foraminal narrowing. C3-C4: Diffuse disc bulge with bilateral uncovertebral spurring. Mild left greater than right facet hypertrophy. Mild spinal stenosis. Severe left with moderate right C4 foraminal narrowing. C4-C5: Left paracentral disc protrusion and density ventral thecal sac. Mild cord flattening but no visible cord signal changes. Superimposed bilateral facet hypertrophy. Resultant in mild to moderate spinal stenosis. Severe left C5 foraminal narrowing. Right neural foramen appears patent. C5-C6: Left paracentral disc protrusion flattens the ventral thecal sac. Mild cord flattening without visible cord signal changes. Right greater than left facet hypertrophy. Moderate spinal stenosis with moderate left C6 foraminal narrowing. Right neural foramen appears patent. C6-C7: Degenerative vertebral disc space narrowing. A broad posterior disc osteophyte mildly flattens the ventral thecal sac. Mild spinal stenosis. Mild  right greater than left C7 foraminal narrowing. C7-T1: Negative interspace. Mild bilateral facet hypertrophy. No spinal stenosis. Mild left C8 foraminal narrowing. IMPRESSION: 1. Mild edema within the posterior paraspinous musculature at C3-C4, which may reflect muscular injury or strain. 2. No other acute.  abnormality within the cervical spine. 3. Multilevel cervical spondylosis with resultant mild to moderate diffuse spinal stenosis at C3-C4 through C6  c7. Associated severe left with moderate right C4 foraminal stenosis, severe left C5 foraminal narrowing, with moderate left C6 foraminal stenosis. Electronically signed by: Morene Hoard MD 10/25/2024 03:23 AM EDT RP Workstation: [id number]   MR BRAIN WO CONTRAST Result Date: 10/25/2024 EXAM: MRI BRAIN WITHOUT CONTRAST 10/25/2024 02:51:14 AM TECHNIQUE: Multiplanar multisequence MRI of the head/brain was performed without the administration of intravenous contrast. Examination degraded by motion. COMPARISON: Comparison with prior CT from 10/24/2024. CLINICAL HISTORY: Neuro deficit, acute, stroke suspected. FINDINGS: BRAIN AND VENTRICLES: No acute infarct. No intracranial hemorrhage. No mass. No midline shift. Generalized age-related atrophy. Patchy T2/FLAIR hyperintensity involving the periventricular and deep white matter, consistent with chronic cerebral ischemic disease, moderate in nature. Mild ventricular prominence related to global parenchymal volume loss without hydrocephalus. The sella is unremarkable. Normal flow voids. ORBITS: Prior bilateral ocular lens replacement. SINUSES AND MASTOIDS: Trace bilateral mastoid effusions noted, of indeterminate significance. BONES AND SOFT TISSUES: Normal marrow signal. No acute soft tissue abnormality. IMPRESSION: 1. No acute intracranial abnormality. 2. Age-related cerebral atrophy with moderate chronic microvascular ischemic disease. Electronically signed by: Morene Hoard MD 10/25/2024 03:12 AM EDT  RP Workstation: [id number]   CT Angio Chest PE W and/or Wo Contrast Result Date: 10/24/2024 EXAM: CTA CHEST PE WITHOUT AND WITH CONTRAST CT ABDOMEN AND PELVIS WITHOUT AND WITH CONTRAST 10/24/2024 08:56:53 PM TECHNIQUE: CTA of the chest was performed after the administration of 75 mL of iohexol (OMNIPAQUE) 350 MG/ML injection. Multiplanar reformatted images are provided for review. MIP images are provided for review. CT of the abdomen and pelvis was performed with the administration of intravenous contrast. Automated exposure control, iterative reconstruction, and/or weight based adjustment of the mA/kV was utilized to reduce the radiation dose to as low as reasonably achievable. COMPARISON: Comparison is made to 07/05/2023. CLINICAL HISTORY: Pulmonary embolism (PE) suspected, high prob; fever, pneumonia. FINDINGS: CHEST: PULMONARY ARTERIES: Pulmonary arteries are adequately opacified for evaluation. No intraluminal filling defect to suggest pulmonary embolism. Central pulmonary arteries are of normal caliber. Main pulmonary artery is normal in caliber. MEDIASTINUM: No mediastinal lymphadenopathy. The heart demonstrates mild coronary artery calcification and global cardiac size is mildly enlarged. No pericardial effusion. Fusiform aneurysm of the ascending aorta measuring 4.3 cm in maximal diameter. Descending thoracic aorta is of normal caliber. Mild atherosclerotic calcification within the thoracic aorta. LUNGS AND PLEURA: Bibasilar atelectasis. Superimposed infiltrate within the posterior basal right lower lobe, possibly infectious in the acute setting. No pneumothorax or pleural effusion. SOFT TISSUES AND BONES: No acute bone or soft tissue abnormality. ABDOMEN AND PELVIS: LIVER: The liver is unremarkable. GALLBLADDER AND BILE DUCTS: Gallbladder is unremarkable. No biliary ductal dilatation. SPLEEN: Spleen demonstrates no acute abnormality. PANCREAS: Pancreas demonstrates no acute abnormality. ADRENAL GLANDS:  Adrenal glands demonstrate no acute abnormality. KIDNEYS, URETERS AND BLADDER: Stable simple cortical cysts within the right kidney. The kidneys are otherwise unremarkable. No stones in the kidneys or ureters. No hydronephrosis. No perinephric or periureteral stranding. Urinary bladder is unremarkable. GI AND BOWEL: Appendix absent. The stomach, small bowel, and large bowel are otherwise unremarkable. There is no bowel obstruction. No abnormal bowel wall thickening or distension. REPRODUCTIVE: Reproductive organs are unremarkable. PERITONEUM AND RETROPERITONEUM: No ascites or free air. LYMPH NODES: No lymphadenopathy. BONES AND SOFT TISSUES: Osseous structures are age appropriate. No acute bone abnormality. No lytic or blastic bone lesion. Mild aortoiliac atherosclerotic calcification. No aortic aneurysm. No focal soft tissue abnormality. RAF SCORE: Aortic atherosclerosis (icd10-i70.0), aortic aneurysm (icd10-i71.9) IMPRESSION: 1. No pulmonary embolism. 2. Superimposed infiltrate within  the posterior basal right lower lobe, possibly infectious in the acute setting. 3. Fusiform aneurysm of the ascending thoracic aorta measuring 4.3 cm in maximal diameter. Recommend annual imaging follow-up by CTA or mra. This recommendation follows 2010 accf/aha/aats/ACR/asa/sca/scai/sir/sts/svm guidelines for the diagnosis and management of patients with thoracic aortic disease. Circulation. 2010; 121: Z733-z630. Aortic aneurysm nos (icd10-i71.9) 4. raf score: Aortic atherosclerosis (icd10-i70.0); aortic aneurysm (pri89-p28.9) Electronically signed by: Dorethia Molt MD 10/24/2024 09:31 PM EDT RP Workstation: [id number]   CT ABDOMEN PELVIS W CONTRAST Result Date: 10/24/2024 EXAM: CTA CHEST PE WITHOUT AND WITH CONTRAST CT ABDOMEN AND PELVIS WITHOUT AND WITH CONTRAST 10/24/2024 08:56:53 PM TECHNIQUE: CTA of the chest was performed after the administration of 75 mL of iohexol (OMNIPAQUE) 350 MG/ML injection. Multiplanar reformatted  images are provided for review. MIP images are provided for review. CT of the abdomen and pelvis was performed with the administration of intravenous contrast. Automated exposure control, iterative reconstruction, and/or weight based adjustment of the mA/kV was utilized to reduce the radiation dose to as low as reasonably achievable. COMPARISON: Comparison is made to 07/05/2023. CLINICAL HISTORY: Pulmonary embolism (PE) suspected, high prob; fever, pneumonia. FINDINGS: CHEST: PULMONARY ARTERIES: Pulmonary arteries are adequately opacified for evaluation. No intraluminal filling defect to suggest pulmonary embolism. Central pulmonary arteries are of normal caliber. Main pulmonary artery is normal in caliber. MEDIASTINUM: No mediastinal lymphadenopathy. The heart demonstrates mild coronary artery calcification and global cardiac size is mildly enlarged. No pericardial effusion. Fusiform aneurysm of the ascending aorta measuring 4.3 cm in maximal diameter. Descending thoracic aorta is of normal caliber. Mild atherosclerotic calcification within the thoracic aorta. LUNGS AND PLEURA: Bibasilar atelectasis. Superimposed infiltrate within the posterior basal right lower lobe, possibly infectious in the acute setting. No pneumothorax or pleural effusion. SOFT TISSUES AND BONES: No acute bone or soft tissue abnormality. ABDOMEN AND PELVIS: LIVER: The liver is unremarkable. GALLBLADDER AND BILE DUCTS: Gallbladder is unremarkable. No biliary ductal dilatation. SPLEEN: Spleen demonstrates no acute abnormality. PANCREAS: Pancreas demonstrates no acute abnormality. ADRENAL GLANDS: Adrenal glands demonstrate no acute abnormality. KIDNEYS, URETERS AND BLADDER: Stable simple cortical cysts within the right kidney. The kidneys are otherwise unremarkable. No stones in the kidneys or ureters. No hydronephrosis. No perinephric or periureteral stranding. Urinary bladder is unremarkable. GI AND BOWEL: Appendix absent. The stomach, small  bowel, and large bowel are otherwise unremarkable. There is no bowel obstruction. No abnormal bowel wall thickening or distension. REPRODUCTIVE: Reproductive organs are unremarkable. PERITONEUM AND RETROPERITONEUM: No ascites or free air. LYMPH NODES: No lymphadenopathy. BONES AND SOFT TISSUES: Osseous structures are age appropriate. No acute bone abnormality. No lytic or blastic bone lesion. Mild aortoiliac atherosclerotic calcification. No aortic aneurysm. No focal soft tissue abnormality. RAF SCORE: Aortic atherosclerosis (icd10-i70.0), aortic aneurysm (icd10-i71.9) IMPRESSION: 1. No pulmonary embolism. 2. Superimposed infiltrate within the posterior basal right lower lobe, possibly infectious in the acute setting. 3. Fusiform aneurysm of the ascending thoracic aorta measuring 4.3 cm in maximal diameter. Recommend annual imaging follow-up by CTA or mra. This recommendation follows 2010 accf/aha/aats/ACR/asa/sca/scai/sir/sts/svm guidelines for the diagnosis and management of patients with thoracic aortic disease. Circulation. 2010; 121: Z733-z630. Aortic aneurysm nos (icd10-i71.9) 4. raf score: Aortic atherosclerosis (icd10-i70.0); aortic aneurysm (pri89-p28.9) Electronically signed by: Dorethia Molt MD 10/24/2024 09:31 PM EDT RP Workstation: [id number]   CT Head Wo Contrast Result Date: 10/24/2024 CLINICAL DATA:  Mental status change EXAM: CT HEAD WITHOUT CONTRAST TECHNIQUE: Contiguous axial images were obtained from the base of the skull through the vertex without intravenous contrast.  RADIATION DOSE REDUCTION: This exam was performed according to the departmental dose-optimization program which includes automated exposure control, adjustment of the mA and/or kV according to patient size and/or use of iterative reconstruction technique. COMPARISON:  CT brain 10/20/2024 FINDINGS: Brain: No acute territorial infarction, hemorrhage or intracranial mass. Atrophy and chronic small vessel ischemic changes of the  white matter. Stable ventricle size. Vascular: No hyperdense vessels.  No unexpected calcification Skull: Normal. Negative for fracture or focal lesion. Sinuses/Orbits: Mild mucosal thickening in the sinuses Other: Small none IMPRESSION: 1. No CT evidence for acute intracranial abnormality. 2. Atrophy and chronic small vessel ischemic changes of the white matter. Electronically Signed   By: Luke Bun M.D.   On: 10/24/2024 17:33   DG Shoulder Right Portable Result Date: 10/24/2024 CLINICAL DATA:  Altered mental status with new onset incontinence. EXAM: RIGHT SHOULDER - 1 VIEW COMPARISON:  None Available. FINDINGS: Mild-to-moderate degenerative change of the [facility] [place] joint. Minimal degenerate change of the glenohumeral joint. No evidence of acute fracture or dislocation. Remainder of the exam is unremarkable. IMPRESSION: 1. No acute findings. 2. Degenerative changes as described. Electronically Signed   By: Toribio Agreste M.D.   On: 10/24/2024 17:23   DG Chest Port 1 View Result Date: 10/24/2024 CLINICAL DATA:  Sepsis.  Altered mental status. EXAM: PORTABLE CHEST 1 VIEW COMPARISON:  10/20/2024 FINDINGS: Lungs are hypoinflated and otherwise clear. Cardiomediastinal silhouette and remainder of the exam is unchanged. IMPRESSION: Hypoinflation without acute cardiopulmonary disease. Electronically Signed   By: Toribio Agreste M.D.   On: 10/24/2024 17:22   CT Cervical Spine Wo Contrast Result Date: 10/20/2024 EXAM: CT CERVICAL SPINE WITHOUT CONTRAST 10/20/2024 08:10:38 AM TECHNIQUE: CT of the cervical spine was performed without the administration of intravenous contrast. Multiplanar reformatted images are provided for review. Automated exposure control, iterative reconstruction, and/or weight based adjustment of the mA/kV was utilized to reduce the radiation dose to as low as reasonably achievable. COMPARISON: CT face and CT head today reported separately. CLINICAL HISTORY: 83 year old male. Neck trauma, multiple  falls this week, bruised eye, AMS at baseline, no complaints. FINDINGS: CERVICAL SPINE: BONES AND ALIGNMENT: Maintained cervical lordosis. Normal bone mineralization for age. No acute fracture or traumatic malalignment. DEGENERATIVE CHANGES: Widespread degenerative cervical disc calcification. Calcified degenerative ligamentous hypertrophy about the odontoid. CT suggests mild degenerative cervical spinal stenosis. SOFT TISSUES: No prevertebral soft tissue swelling. Bulky left ICA calcified atherosclerosis. Negative visible noncontrast thoracic inlet. IMPRESSION: 1. No acute traumatic injury identified in the cervical spine. 2. Cervical disc and ligamentous degeneration. 3. left ICA atherosclerosis. Electronically signed by: Helayne Hurst MD 10/20/2024 08:25 AM EDT RP Workstation: [id number]   CT Maxillofacial Wo Contrast Result Date: 10/20/2024 EXAM: CT OF THE FACE WITHOUT CONTRAST 10/20/2024 08:10:38 AM TECHNIQUE: CT of the face was performed without the administration of intravenous contrast. Multiplanar reformatted images are provided for review. Automated exposure control, iterative reconstruction, and/or weight based adjustment of the mA/kV was utilized to reduce the radiation dose to as low as reasonably achievable. COMPARISON: Head CT reported separately today. CLINICAL HISTORY: 83 year old male. Facial trauma, blunt; R periorbital trauma. Patient BIB EMS from home, where he lives with his daughter, after multiple falls this week. Patient had one fall where he has a bruised eye from. FINDINGS: FACIAL BONES: No acute facial fracture. No mandibular dislocation. No suspicious bone lesion. Underlying facial bones intact. ORBITS: Globes are intact. Postoperative changes to both globes. No acute traumatic injury. No inflammatory change. SINUSES AND MASTOIDS:  Paranasal sinuses, middle ears and mastoids well aerated. SOFT TISSUES: Superficial right infraorbital, premalar soft tissue swelling and stranding on series  3 image 25. No soft tissue gas identified. VASCULATURE: Mild for age right carotid, moderate to severe left ICA calcified atherosclerosis in the visible neck. CERVICAL SPINE: Partially visible cervical spine degeneration. IMPRESSION: 1. Superficial right infraorbital soft tissue injury. 2. No facial bone fracture. 3. 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 Electronically signed by: Helayne Hurst MD 10/20/2024 08:23 AM EDT RP Workstation: [id number]   CT Head Wo Contrast Result Date: 10/20/2024 EXAM: CT HEAD WITHOUT CONTRAST 10/20/2024 08:10:38 AM TECHNIQUE: CT of the head was performed without the administration of intravenous contrast. Automated exposure control, iterative reconstruction, and/or weight based adjustment of the mA/kV was utilized to reduce the radiation dose to as low as reasonably achievable. COMPARISON: CT Head, 08/13/2024. CLINICAL HISTORY: 83 Year Old Male, Head Trauma. FINDINGS: BRAIN AND VENTRICLES: Stable brain volume. Stable, patchy, and confluent cerebral white matter hypodensity, most pronounced in the left superior frontal gyrus posteriorly, perirolandic subcortical white matter. No convincing cortical encephalomalacia. No acute hemorrhage. No evidence of acute infarct. No hydrocephalus. No extra-axial collection. No mass effect or midline shift. ORBITS: No acute abnormality. SINUSES: No acute abnormality. SOFT TISSUES AND SKULL: No acute soft tissue abnormality. No skull fracture. IMPRESSION: 1. No acute traumatic injury. 2. Stable white matter disease. Electronically signed by: Helayne Hurst MD 10/20/2024 08:19 AM EDT RP Workstation: [id number]   DG Pelvis Portable Result Date: 10/20/2024 EXAM: 1 or 2 VIEW(S) XRAY OF THE PELVIS 10/20/2024 07:53:00 AM COMPARISON: CT abdomen and pelvis 07/05/2023. CLINICAL HISTORY: 83 year old male with multiple falls this week, bruised eye, and AMS at baseline. FINDINGS: BONES AND JOINTS: No acute fracture. No  focal osseous lesion. No joint dislocation. SOFT TISSUES: The soft tissues are unremarkable. ABDOMINAL CONTENTS: Negative visible bowel gas pattern. IMPRESSION: 1. No acute fracture or dislocation identified about the pelvis. Electronically signed by: Helayne Hurst MD 10/20/2024 08:03 AM EDT RP Workstation: [id number]   DG Chest Portable 1 View Result Date: 10/20/2024 EXAM: 1 VIEW XRAY OF THE CHEST 10/20/2024 07:53:00 AM COMPARISON: Portable chest x ray 08/13/2024. CLINICAL HISTORY: 83 year old male. Multiple falls this week, including one causing a bruised eye. Patient is not on thinners. AMS at baseline. No complaints. FINDINGS: LUNGS AND PLEURA: Lower lung volumes. No focal pulmonary opacity. No pulmonary edema. No pleural effusion. No pneumothorax. HEART AND MEDIASTINUM: No acute abnormality of the cardiac and mediastinal silhouettes. BONES AND SOFT TISSUES: No acute osseous abnormality. IMPRESSION: 1. lower lung volumes.  No acute cardiopulmonary abnormality. Electronically signed by: Helayne Hurst MD 10/20/2024 08:02 AM EDT RP Workstation: [id number]    Microbiology: Results for orders placed or performed during the hospital encounter of 10/24/24  Resp panel by RT-PCR (RSV, Flu A&B, Covid) Anterior Nasal Swab     Status: None   Collection Time: 10/24/24  4:45 PM   Specimen: Anterior Nasal Swab  Result Value Ref Range Status   SARS Coronavirus 2 by RT PCR NEGATIVE NEGATIVE Final   Influenza A by PCR NEGATIVE NEGATIVE Final   Influenza B by PCR NEGATIVE NEGATIVE Final    Comment: (NOTE) The Xpert Xpress SARS-CoV-2/FLU/RSV plus assay is intended as an aid in the diagnosis of influenza from Nasopharyngeal swab specimens and should not be used as a sole basis for treatment. Nasal washings and aspirates are unacceptable for Xpert Xpress SARS-CoV-2/FLU/RSV testing.  Fact Sheet for Patients: bloggercourse.com  Fact  Sheet for Healthcare  Providers: seriousbroker.it  This test is not yet approved or cleared by the [place]  FDA and has been authorized for detection and/or diagnosis of SARS-CoV-2 by FDA under an Emergency Use Authorization (EUA). This EUA will remain in effect (meaning this test can be used) for the duration of the COVID-19 declaration under Section 564(b)(1) of the Act, 21 U.S.C. section 360bbb-3(b)(1), unless the authorization is terminated or revoked.     Resp Syncytial Virus by PCR NEGATIVE NEGATIVE Final    Comment: (NOTE) Fact Sheet for Patients: bloggercourse.com  Fact Sheet for Healthcare Providers: seriousbroker.it  This test is not yet approved or cleared by the [place]  FDA and has been authorized for detection and/or diagnosis of SARS-CoV-2 by FDA under an Emergency Use Authorization (EUA). This EUA will remain in effect (meaning this test can be used) for the duration of the COVID-19 declaration under Section 564(b)(1) of the Act, 21 U.S.C. section 360bbb-3(b)(1), unless the authorization is terminated or revoked.  Performed at [facility], [address] [place], [place] [address]   Blood Culture (routine x 2)     Status: None   Collection Time: 10/24/24  5:15 PM   Specimen: BLOOD RIGHT FOREARM  Result Value Ref Range Status   Specimen Description BLOOD RIGHT FOREARM  Final   Special Requests   Final    BOTTLES DRAWN AEROBIC AND ANAEROBIC Blood Culture adequate volume   Culture   Final    NO GROWTH 5 DAYS Performed at [facility], [address], [place], [place] [address]    Report Status 10/29/2024 FINAL  Final  Blood Culture (routine x 2)     Status: None   Collection Time: 10/24/24  5:18 PM   Specimen: BLOOD  Result Value Ref Range Status   Specimen Description BLOOD LEFT ANTECUBITAL  Final   Special Requests   Final    BOTTLES DRAWN AEROBIC AND ANAEROBIC Blood Culture  results may not be optimal due to an inadequate volume of blood received in culture bottles   Culture   Final    NO GROWTH 5 DAYS Performed at [facility], [address], [place], [place] [address]    Report Status 10/29/2024 FINAL  Final  Body fluid culture w Gram Stain     Status: None   Collection Time: 10/24/24 11:18 PM   Specimen: Synovium; Body Fluid  Result Value Ref Range Status   Specimen Description SYNOVIAL FLUID  Final   Special Requests RIGHT KNEE  Final   Gram Stain   Final    RARE WBC PRESENT, PREDOMINANTLY PMN NO ORGANISMS SEEN    Culture   Final    NO GROWTH 3 DAYS Performed at [facility], [address] [place], [place] [address]    Report Status 10/28/2024 FINAL  Final    Labs: CBC: Recent Labs  Lab 10/24/24 1652 10/24/24 1700 10/26/24 1209 10/27/24 0548 10/28/24 0524 10/29/24 0121 10/30/24 0424  WBC 13.9*   < > 10.1 7.9 6.3 8.3 8.5  NEUTROABS 10.9*  --   --   --   --   --   --   HGB 16.6   < > 14.6 14.1 13.4 14.2 14.1  HCT 47.7   < > 43.1 41.5 39.7 41.1 41.1  MCV 98.4   < > 98.6 97.6 98.3 96.7 97.9  PLT 278   < > 317 320 325 405* 408*   < > =  values in this interval not displayed.   Basic Metabolic Panel: Recent Labs  Lab 10/27/24 0548 10/28/24 0524 10/28/24 1516 10/29/24 0121 10/30/24 0424 10/31/24 0331  NA 136  --  137 135 136 136  K 3.6  --  3.6 3.6 3.9 3.5  CL 105  --  106 107 106 104  CO2 20*  --  22 18* 18* 19*  GLUCOSE 109*  --  110* 109* 99 91  BUN 24*  --  21 16 18 20   CREATININE 1.26*  --  1.08 1.05 1.12 1.16  CALCIUM  8.4*  --  7.7* 8.2* 8.3* 8.4*  MG  --  2.2  --   --   --   --    Liver Function Tests: Recent Labs  Lab 10/27/24 0548 10/28/24 1516 10/29/24 0121 10/30/24 0424 10/31/24 0331  AST 266* 235* 194* 126* 113*  ALT 187* 222* 213* 185* 176*  ALKPHOS 129* 146* 165* 156* 161*  BILITOT 1.4* 0.6 1.1 1.1 0.8  PROT 6.2* 5.9* 6.0* 5.9* 6.2*  ALBUMIN 2.4* 2.2* 2.3* 2.2* 2.3*   CBG: Recent Labs   Lab 10/25/24 1158 10/26/24 0632  GLUCAP 125* 110*    Discharge time spent: {LESS THAN/GREATER THAN:26388} 30 minutes.  Signed: Concepcion Riser, MD [facility] 10/31/2024

## 2024-10-31 NOTE — TOC Initial Note (Signed)
 Transition of Care [facility]) - Initial/Assessment Note    Patient Details  Name: Blake Atkins MRN: [id number] Date of Birth: Oct 09, 1941  Transition of Care [facility]) CM/SW Contact:    Sherline Clack, LCSWA Phone Number: 10/31/2024, 9:25 AM  Clinical Narrative:                  CSW reviewed [facility] facility ratings for SNF with patient's wife at bedside. Patient's wife requested CSW come back in a couple hours because she would like to discuss placement vs. returning home with home health with her children. CSW will return before noon to discuss next steps with wife. Wife understands that if the family would like SNF placement, the patient would stay in the hospital a couple more days awaiting insurance authorization. CSW will continue to follow and update DC plan.   Expected Discharge Plan: Skilled Nursing Facility     Patient Goals and CMS Choice            Expected Discharge Plan and Services       Living arrangements for the past 2 months: Single Family Home                             HH Agency: [facility]        Prior Living Arrangements/Services Living arrangements for the past 2 months: Single Family Home Lives with:: Spouse              Current home services: Homehealth aide, Home OT, Home PT, Home RN    Activities of Daily Living   ADL Screening (condition at time of admission) Independently performs ADLs?: No Does the patient have a NEW difficulty with bathing/dressing/toileting/self-feeding that is expected to last >3 days?: Yes (Initiates electronic notice to provider for possible OT consult) Does the patient have a NEW difficulty with getting in/out of bed, walking, or climbing stairs that is expected to last >3 days?: Yes (Initiates electronic notice to provider for possible PT consult) Does the patient have a NEW difficulty with communication that is expected to last >3 days?: Yes (Initiates electronic notice to provider for  possible SLP consult) Is the patient deaf or have difficulty hearing?: Yes Does the patient have difficulty seeing, even when wearing glasses/contacts?: Yes Does the patient have difficulty concentrating, remembering, or making decisions?: Yes  Permission Sought/Granted                  Emotional Assessment   Attitude/Demeanor/Rapport: Engaged Affect (typically observed): Appropriate Orientation: : Oriented to Self      Admission diagnosis:  Pneumonia [J18.9] Community acquired pneumonia, unspecified laterality [J18.9] Sepsis, due to unspecified organism, unspecified whether acute organ dysfunction present [facility]) [A41.9] Patient Active Problem List   Diagnosis Date Noted   Drug-induced liver injury 10/29/2024   Pressure injury of skin 10/29/2024   Pseudogout of right knee 10/29/2024   Gout 10/25/2024   Sepsis (HCC) 10/25/2024   Acute metabolic encephalopathy 10/25/2024   Community acquired pneumonia 10/24/2024   Altered mental status 08/14/2024   New onset seizure (HCC) 08/14/2024   History of dementia 08/14/2024   History of expressive aphasia 08/14/2024   Essential hypertension 08/14/2024   CKD stage 3b, GFR 30-44 ml/min (HCC) 08/14/2024   Syncope 08/14/2024   Aftercare following surgery of the circulatory system, NEC 01/18/2014   Occlusion and stenosis of carotid artery without mention of cerebral infarction 12/23/2011   PCP:  Loreli Elsie BIRCH  [name], MD Pharmacy:   [facility] [place], [place] - [address] [place] [address] Phone: [phone] Fax: [phone]  [facility] [address] [place] [address] Phone: [phone] Fax: [phone]     Social Drivers of Health (SDOH) Social History: SDOH Screenings   Food Insecurity: No Food Insecurity (10/25/2024)  Housing: Low Risk  (10/25/2024)  Transportation Needs: No Transportation Needs (10/25/2024)  Utilities: Not At Risk  (10/25/2024)  Social Connections: Socially Integrated (10/25/2024)  Tobacco Use: Low Risk  (10/25/2024)   SDOH Interventions:     Readmission Risk Interventions    10/31/2024    8:52 AM  Readmission Risk Prevention Plan  Transportation Screening Complete  PCP or Specialist Appt within 5-7 Days Complete  Home Care Screening Complete  Medication Review (RN CM) Complete

## 2024-10-31 NOTE — Progress Notes (Signed)
    Durable Medical Equipment  (From admission, onward)           Start     Ordered   10/31/24 0951  For home use only DME standard manual wheelchair with seat cushion  Once       Comments: Patient suffers from sepsis, aspiration pneumonia, metabolic encephalopathy, alzheimer's dementia which impairs their ability to perform daily activities like toileting in the home.  A walker will not resolve issue with performing activities of daily living. A wheelchair will allow patient to safely perform daily activities. Patient can safely propel the wheelchair in the home or has a caregiver who can provide assistance. Length of need Lifetime. Accessories: elevating leg rests (ELRs), wheel locks, extensions and anti-tippers.  Weight - 86 kg, height - 5'9   10/31/24 9046

## 2024-11-08 ENCOUNTER — Other Ambulatory Visit: Payer: Self-pay

## 2024-11-12 DIAGNOSIS — N183 Chronic kidney disease, stage 3 unspecified: Secondary | ICD-10-CM | POA: Diagnosis not present

## 2024-11-12 DIAGNOSIS — A419 Sepsis, unspecified organism: Secondary | ICD-10-CM | POA: Diagnosis not present

## 2024-11-12 DIAGNOSIS — J690 Pneumonitis due to inhalation of food and vomit: Secondary | ICD-10-CM | POA: Diagnosis not present

## 2024-11-12 DIAGNOSIS — R4701 Aphasia: Secondary | ICD-10-CM | POA: Diagnosis not present

## 2024-11-12 DIAGNOSIS — F0280 Dementia in other diseases classified elsewhere without behavioral disturbance: Secondary | ICD-10-CM | POA: Diagnosis not present

## 2024-11-12 DIAGNOSIS — G9341 Metabolic encephalopathy: Secondary | ICD-10-CM | POA: Diagnosis not present

## 2024-11-12 DIAGNOSIS — G309 Alzheimer's disease, unspecified: Secondary | ICD-10-CM | POA: Diagnosis not present

## 2024-11-12 DIAGNOSIS — I129 Hypertensive chronic kidney disease with stage 1 through stage 4 chronic kidney disease, or unspecified chronic kidney disease: Secondary | ICD-10-CM | POA: Diagnosis not present

## 2024-11-15 DIAGNOSIS — M1711 Unilateral primary osteoarthritis, right knee: Secondary | ICD-10-CM | POA: Diagnosis not present
